# Patient Record
Sex: Female | Born: 1950 | Race: White | Hispanic: No | Marital: Married | Smoking: Never smoker
Health system: Southern US, Community
[De-identification: ages and names within clinical notes are randomized; demographics above are authoritative.]

## PROBLEM LIST (undated history)

## (undated) ENCOUNTER — Emergency Department (HOSPITAL_COMMUNITY): Disposition: A | Discharge status: Home / Self Care | Source: Home / Self Care

## (undated) DIAGNOSIS — Z780 Asymptomatic menopausal state: Secondary | ICD-10-CM

## (undated) DIAGNOSIS — K219 Gastro-esophageal reflux disease without esophagitis: Secondary | ICD-10-CM

## (undated) DIAGNOSIS — D259 Leiomyoma of uterus, unspecified: Secondary | ICD-10-CM

## (undated) DIAGNOSIS — B379 Candidiasis, unspecified: Secondary | ICD-10-CM

## (undated) DIAGNOSIS — Z8619 Personal history of other infectious and parasitic diseases: Secondary | ICD-10-CM

## (undated) DIAGNOSIS — F329 Major depressive disorder, single episode, unspecified: Secondary | ICD-10-CM

## (undated) DIAGNOSIS — F32A Depression, unspecified: Secondary | ICD-10-CM

## (undated) DIAGNOSIS — IMO0002 Reserved for concepts with insufficient information to code with codable children: Secondary | ICD-10-CM

## (undated) DIAGNOSIS — N952 Postmenopausal atrophic vaginitis: Secondary | ICD-10-CM

## (undated) DIAGNOSIS — K120 Recurrent oral aphthae: Secondary | ICD-10-CM

## (undated) DIAGNOSIS — Z87898 Personal history of other specified conditions: Secondary | ICD-10-CM

## (undated) HISTORY — PX: DILATION AND CURETTAGE OF UTERUS: SHX78

## (undated) HISTORY — PX: TUBAL LIGATION: SHX77

## (undated) HISTORY — DX: Leiomyoma of uterus, unspecified: D25.9

## (undated) HISTORY — DX: Asymptomatic menopausal state: Z78.0

## (undated) HISTORY — DX: Personal history of other specified conditions: Z87.898

## (undated) HISTORY — PX: OTHER SURGICAL HISTORY: SHX169

## (undated) HISTORY — DX: Depression, unspecified: F32.A

## (undated) HISTORY — DX: Gastro-esophageal reflux disease without esophagitis: K21.9

## (undated) HISTORY — DX: Major depressive disorder, single episode, unspecified: F32.9

## (undated) HISTORY — PX: CARPAL TUNNEL RELEASE: SHX101

## (undated) HISTORY — DX: Personal history of other infectious and parasitic diseases: Z86.19

## (undated) HISTORY — DX: Reserved for concepts with insufficient information to code with codable children: IMO0002

## (undated) HISTORY — DX: Candidiasis, unspecified: B37.9

## (undated) HISTORY — DX: Postmenopausal atrophic vaginitis: N95.2

## (undated) HISTORY — DX: Recurrent oral aphthae: K12.0

---

## 1997-11-10 ENCOUNTER — Other Ambulatory Visit: Admission: RE | Admit: 1997-11-10 | Discharge: 1997-11-10 | Payer: Self-pay | Admitting: Obstetrics and Gynecology

## 1998-04-18 DIAGNOSIS — IMO0002 Reserved for concepts with insufficient information to code with codable children: Secondary | ICD-10-CM

## 1998-04-18 DIAGNOSIS — R87619 Unspecified abnormal cytological findings in specimens from cervix uteri: Secondary | ICD-10-CM

## 1998-04-18 HISTORY — DX: Reserved for concepts with insufficient information to code with codable children: IMO0002

## 1998-04-18 HISTORY — DX: Unspecified abnormal cytological findings in specimens from cervix uteri: R87.619

## 1999-01-26 ENCOUNTER — Other Ambulatory Visit: Admission: RE | Admit: 1999-01-26 | Discharge: 1999-01-26 | Payer: Self-pay | Admitting: Obstetrics and Gynecology

## 1999-01-26 ENCOUNTER — Encounter (INDEPENDENT_AMBULATORY_CARE_PROVIDER_SITE_OTHER): Payer: Self-pay | Admitting: Specialist

## 1999-04-01 ENCOUNTER — Other Ambulatory Visit: Admission: RE | Admit: 1999-04-01 | Discharge: 1999-04-01 | Payer: Self-pay | Admitting: Obstetrics and Gynecology

## 1999-04-01 ENCOUNTER — Encounter (INDEPENDENT_AMBULATORY_CARE_PROVIDER_SITE_OTHER): Payer: Self-pay

## 1999-10-14 ENCOUNTER — Other Ambulatory Visit: Admission: RE | Admit: 1999-10-14 | Discharge: 1999-10-14 | Payer: Self-pay | Admitting: Obstetrics and Gynecology

## 1999-10-14 ENCOUNTER — Encounter (INDEPENDENT_AMBULATORY_CARE_PROVIDER_SITE_OTHER): Payer: Self-pay

## 2001-06-27 ENCOUNTER — Encounter: Payer: Self-pay | Admitting: Obstetrics and Gynecology

## 2001-06-27 ENCOUNTER — Ambulatory Visit (HOSPITAL_COMMUNITY): Admission: RE | Admit: 2001-06-27 | Discharge: 2001-06-27 | Payer: Self-pay | Admitting: Obstetrics and Gynecology

## 2001-07-05 ENCOUNTER — Encounter: Admission: RE | Admit: 2001-07-05 | Discharge: 2001-07-05 | Payer: Self-pay | Admitting: Obstetrics and Gynecology

## 2001-07-05 ENCOUNTER — Encounter: Payer: Self-pay | Admitting: Obstetrics and Gynecology

## 2002-01-02 ENCOUNTER — Encounter: Payer: Self-pay | Admitting: Obstetrics and Gynecology

## 2002-01-02 ENCOUNTER — Encounter: Admission: RE | Admit: 2002-01-02 | Discharge: 2002-01-02 | Payer: Self-pay | Admitting: Obstetrics and Gynecology

## 2002-01-16 ENCOUNTER — Ambulatory Visit (HOSPITAL_COMMUNITY): Admission: RE | Admit: 2002-01-16 | Discharge: 2002-01-16 | Payer: Self-pay | Admitting: Obstetrics and Gynecology

## 2002-01-16 ENCOUNTER — Encounter: Payer: Self-pay | Admitting: Obstetrics and Gynecology

## 2002-08-27 ENCOUNTER — Encounter: Admission: RE | Admit: 2002-08-27 | Discharge: 2002-08-27 | Payer: Self-pay | Admitting: Otolaryngology

## 2002-08-27 ENCOUNTER — Encounter: Payer: Self-pay | Admitting: Otolaryngology

## 2002-09-30 ENCOUNTER — Encounter: Payer: Self-pay | Admitting: Obstetrics and Gynecology

## 2002-09-30 ENCOUNTER — Encounter: Admission: RE | Admit: 2002-09-30 | Discharge: 2002-09-30 | Payer: Self-pay | Admitting: Obstetrics and Gynecology

## 2002-11-20 ENCOUNTER — Ambulatory Visit (HOSPITAL_COMMUNITY): Admission: RE | Admit: 2002-11-20 | Discharge: 2002-11-20 | Payer: Self-pay | Admitting: Gastroenterology

## 2003-07-15 ENCOUNTER — Other Ambulatory Visit: Admission: RE | Admit: 2003-07-15 | Discharge: 2003-07-15 | Payer: Self-pay | Admitting: Obstetrics and Gynecology

## 2004-07-19 ENCOUNTER — Other Ambulatory Visit: Admission: RE | Admit: 2004-07-19 | Discharge: 2004-07-19 | Payer: Self-pay | Admitting: Obstetrics and Gynecology

## 2004-12-29 ENCOUNTER — Encounter: Admission: RE | Admit: 2004-12-29 | Discharge: 2004-12-29 | Payer: Self-pay | Admitting: Obstetrics and Gynecology

## 2005-07-20 ENCOUNTER — Other Ambulatory Visit: Admission: RE | Admit: 2005-07-20 | Discharge: 2005-07-20 | Payer: Self-pay | Admitting: Obstetrics and Gynecology

## 2006-01-16 ENCOUNTER — Encounter: Admission: RE | Admit: 2006-01-16 | Discharge: 2006-01-16 | Payer: Self-pay | Admitting: Obstetrics and Gynecology

## 2006-07-28 ENCOUNTER — Other Ambulatory Visit: Admission: RE | Admit: 2006-07-28 | Discharge: 2006-07-28 | Payer: Self-pay | Admitting: Obstetrics and Gynecology

## 2007-01-31 ENCOUNTER — Encounter: Admission: RE | Admit: 2007-01-31 | Discharge: 2007-01-31 | Payer: Self-pay | Admitting: Obstetrics and Gynecology

## 2007-07-30 ENCOUNTER — Other Ambulatory Visit: Admission: RE | Admit: 2007-07-30 | Discharge: 2007-07-30 | Payer: Self-pay | Admitting: Obstetrics and Gynecology

## 2008-02-01 ENCOUNTER — Encounter: Admission: RE | Admit: 2008-02-01 | Discharge: 2008-02-01 | Payer: Self-pay | Admitting: Obstetrics and Gynecology

## 2008-02-13 ENCOUNTER — Encounter: Admission: RE | Admit: 2008-02-13 | Discharge: 2008-02-13 | Payer: Self-pay | Admitting: Obstetrics and Gynecology

## 2008-04-14 ENCOUNTER — Ambulatory Visit (HOSPITAL_COMMUNITY): Admission: RE | Admit: 2008-04-14 | Discharge: 2008-04-14 | Payer: Self-pay | Admitting: Gastroenterology

## 2008-04-24 ENCOUNTER — Ambulatory Visit (HOSPITAL_COMMUNITY): Admission: RE | Admit: 2008-04-24 | Discharge: 2008-04-24 | Payer: Self-pay | Admitting: Otolaryngology

## 2008-07-30 ENCOUNTER — Other Ambulatory Visit: Admission: RE | Admit: 2008-07-30 | Discharge: 2008-07-30 | Payer: Self-pay | Admitting: Obstetrics and Gynecology

## 2009-02-03 ENCOUNTER — Encounter: Admission: RE | Admit: 2009-02-03 | Discharge: 2009-02-03 | Payer: Self-pay | Admitting: Obstetrics and Gynecology

## 2009-08-04 ENCOUNTER — Other Ambulatory Visit: Admission: RE | Admit: 2009-08-04 | Discharge: 2009-08-04 | Payer: Self-pay | Admitting: Obstetrics and Gynecology

## 2010-02-04 ENCOUNTER — Encounter: Admission: RE | Admit: 2010-02-04 | Discharge: 2010-02-04 | Payer: Self-pay | Admitting: Family Medicine

## 2010-09-03 NOTE — Op Note (Signed)
NAME:  Donna Barber, Donna Barber                         ACCOUNT NO.:  [id number]   MEDICAL RECORD NO.:  [id number]                   PATIENT TYPE:  AMB   LOCATION:  ENDO                                 FACILITY:  [facility]   PHYSICIAN:  Anselmo Rod, M.D.               DATE OF BIRTH:  11-20-1950   DATE OF PROCEDURE:  11/20/2002  DATE OF DISCHARGE:                                 OPERATIVE REPORT   PROCEDURE:  Colonoscopy.   ENDOSCOPIST:  Anselmo Rod, M.D.   INSTRUMENT USED:  Olympus video colonoscope.   INDICATIONS FOR PROCEDURE:  Guaiac positive stools in a 60 year old white  female.  Rule out colonic polyps, masses, etc.   PRE-PROCEDURE PREPARATION:  Informed consent was procured from the patient.  The patient was fasted for eight hours prior to the procedure and prepped  with a bottle of magnesium citrate and a gallon of GoLYTELY the night prior  to the procedure.   PRE-PROCEDURE PHYSICAL:  VITAL SIGNS:  The patient had stable vital signs.  NECK:  Supple.  CHEST:  Clear to auscultation.  CARDIOVASCULAR:  S1 and S2 are regular.  ABDOMEN:  Soft with normal bowel sounds.   DESCRIPTION OF PROCEDURE:  The patient was placed in the left lateral  decubitus position and sedated with 100 mg of Demerol and 10 mg of Versed  intravenously.  Once the patient was adequately sedated and maintained on  low flow oxygen and continuous cardiac monitoring the Olympus video  colonoscope was advanced from the rectum to the cecum and terminal ileum.  The appendiceal orifice and ileocecal valve were clearly visualized and  photographed.  The terminal ileum appeared normal and without lesions.  No  masses, polyps, erosions, ulcerations or diverticula were seen.  Small  internal hemorrhoids were appreciated on retroflexion of the rectum.  The  patient tolerated the procedure well without complications.   IMPRESSION:  1. Essentially normal colonoscopy except for small non-bleeding internal  hemorrhoids.  No masses or polyps were seen.  2. No evidence of diverticulosis.  3. Normal appearing terminal ileum.              RECOMMENDATIONS:  1. High fiber diet with liberal fluid intake has been advocated.  2. Repeat colorectal cancer screening is recommended in the next ten years     unless the patient develops any abnormal symptoms.  3. Outpatient follow-up as the need arises in the future.                                               Anselmo Rod, M.D.    JNM/MEDQ  D:  11/21/2002  T:  11/21/2002  Job:  [id number]   cc:   Gretta Arab. Valentina Lucks, M.D.  [address]  [place]  [place] [address]  Fax: [phone]

## 2010-12-27 ENCOUNTER — Other Ambulatory Visit: Payer: Self-pay | Admitting: Family Medicine

## 2010-12-27 DIAGNOSIS — Z1231 Encounter for screening mammogram for malignant neoplasm of breast: Secondary | ICD-10-CM

## 2011-02-08 ENCOUNTER — Ambulatory Visit
Admission: RE | Admit: 2011-02-08 | Discharge: 2011-02-08 | Disposition: A | Discharge status: Home / Self Care | Payer: Self-pay | Source: Ambulatory Visit | Attending: Family Medicine | Admitting: Family Medicine

## 2011-02-08 DIAGNOSIS — Z1231 Encounter for screening mammogram for malignant neoplasm of breast: Secondary | ICD-10-CM

## 2011-07-08 DIAGNOSIS — IMO0002 Reserved for concepts with insufficient information to code with codable children: Secondary | ICD-10-CM | POA: Insufficient documentation

## 2011-07-25 ENCOUNTER — Other Ambulatory Visit: Payer: Self-pay

## 2011-07-25 DIAGNOSIS — Z780 Asymptomatic menopausal state: Secondary | ICD-10-CM

## 2011-08-04 ENCOUNTER — Other Ambulatory Visit: Payer: BC Managed Care – PPO

## 2011-08-04 ENCOUNTER — Encounter: Payer: Self-pay | Admitting: Obstetrics and Gynecology

## 2011-08-04 ENCOUNTER — Other Ambulatory Visit: Payer: Self-pay

## 2011-08-30 ENCOUNTER — Encounter: Payer: BC Managed Care – PPO | Admitting: Obstetrics and Gynecology

## 2011-08-30 ENCOUNTER — Other Ambulatory Visit: Payer: BC Managed Care – PPO

## 2011-09-15 ENCOUNTER — Other Ambulatory Visit (INDEPENDENT_AMBULATORY_CARE_PROVIDER_SITE_OTHER): Payer: BC Managed Care – PPO

## 2011-09-15 ENCOUNTER — Ambulatory Visit (INDEPENDENT_AMBULATORY_CARE_PROVIDER_SITE_OTHER): Payer: BC Managed Care – PPO | Admitting: Obstetrics and Gynecology

## 2011-09-15 ENCOUNTER — Encounter: Payer: Self-pay | Admitting: Obstetrics and Gynecology

## 2011-09-15 VITALS — BP 130/66 | Ht 65.5 in | Wt 122.0 lb

## 2011-09-15 DIAGNOSIS — N951 Menopausal and female climacteric states: Secondary | ICD-10-CM

## 2011-09-15 DIAGNOSIS — Z780 Asymptomatic menopausal state: Secondary | ICD-10-CM

## 2011-09-15 DIAGNOSIS — N952 Postmenopausal atrophic vaginitis: Secondary | ICD-10-CM

## 2011-09-15 DIAGNOSIS — M899 Disorder of bone, unspecified: Secondary | ICD-10-CM

## 2011-09-15 DIAGNOSIS — M8580 Other specified disorders of bone density and structure, unspecified site: Secondary | ICD-10-CM

## 2011-09-16 NOTE — Progress Notes (Signed)
Subjective:     Donna Barber is a 61 y.o. female who presents for evaluation of an treatment of atrophic vaginits, and to review DXA results.  Symptoms  Of atrophy are improved though not completely resolved since starting vagifem:  DXA shows slight decrease in BMD in the spine with nl BMD in the hip and a very low risk of fracture  The following portions of the patient's history were reviewed and updated as appropriate: allergies, current medications, past family history, past medical history, past surgical history and problem list.   Review of Systems Pertinent items are noted in HPI.    Objective:    Pelvic exam: Deferred  DXA:  See report   Assessment:    Atrophic vaginitis improved.  Osteopenia with low FRAX   Plan:    Continue vagifem 2x /wk  Continue wt bearing exercise, calcium Next DXA age 50 F/U @ [facility]

## 2011-09-22 ENCOUNTER — Other Ambulatory Visit: Payer: BC Managed Care – PPO

## 2011-09-22 ENCOUNTER — Encounter: Payer: BC Managed Care – PPO | Admitting: Obstetrics and Gynecology

## 2011-12-23 ENCOUNTER — Other Ambulatory Visit: Payer: Self-pay | Admitting: Dermatology

## 2012-01-03 ENCOUNTER — Other Ambulatory Visit: Payer: Self-pay | Admitting: Family Medicine

## 2012-01-03 DIAGNOSIS — Z1231 Encounter for screening mammogram for malignant neoplasm of breast: Secondary | ICD-10-CM

## 2012-02-10 ENCOUNTER — Ambulatory Visit: Payer: BC Managed Care – PPO

## 2012-03-22 ENCOUNTER — Ambulatory Visit
Admission: RE | Admit: 2012-03-22 | Discharge: 2012-03-22 | Disposition: A | Discharge status: Home / Self Care | Payer: BC Managed Care – PPO | Source: Ambulatory Visit | Attending: Family Medicine | Admitting: Family Medicine

## 2012-03-22 DIAGNOSIS — Z1231 Encounter for screening mammogram for malignant neoplasm of breast: Secondary | ICD-10-CM

## 2012-05-02 ENCOUNTER — Ambulatory Visit: Payer: BC Managed Care – PPO | Admitting: Obstetrics and Gynecology

## 2013-02-06 ENCOUNTER — Other Ambulatory Visit: Payer: Self-pay | Admitting: Dermatology

## 2013-03-05 ENCOUNTER — Other Ambulatory Visit: Payer: Self-pay | Admitting: Family Medicine

## 2013-03-05 DIAGNOSIS — Z1231 Encounter for screening mammogram for malignant neoplasm of breast: Secondary | ICD-10-CM

## 2013-03-26 ENCOUNTER — Ambulatory Visit (INDEPENDENT_AMBULATORY_CARE_PROVIDER_SITE_OTHER): Payer: BC Managed Care – PPO

## 2013-03-26 DIAGNOSIS — Z1231 Encounter for screening mammogram for malignant neoplasm of breast: Secondary | ICD-10-CM

## 2013-12-11 ENCOUNTER — Ambulatory Visit
Admission: RE | Admit: 2013-12-11 | Discharge: 2013-12-11 | Disposition: A | Discharge status: Home / Self Care | Payer: BC Managed Care – PPO | Source: Ambulatory Visit | Attending: Family Medicine | Admitting: Family Medicine

## 2013-12-11 ENCOUNTER — Other Ambulatory Visit: Payer: Self-pay | Admitting: Family Medicine

## 2013-12-11 DIAGNOSIS — M25552 Pain in left hip: Secondary | ICD-10-CM

## 2014-02-17 ENCOUNTER — Encounter: Payer: Self-pay | Admitting: Obstetrics and Gynecology

## 2014-07-28 ENCOUNTER — Other Ambulatory Visit: Payer: Self-pay

## 2014-07-28 DIAGNOSIS — Z1231 Encounter for screening mammogram for malignant neoplasm of breast: Secondary | ICD-10-CM

## 2014-08-04 ENCOUNTER — Ambulatory Visit
Admission: RE | Admit: 2014-08-04 | Discharge: 2014-08-04 | Disposition: A | Discharge status: Home / Self Care | Payer: Medicare Other | Source: Ambulatory Visit

## 2014-08-04 DIAGNOSIS — Z1231 Encounter for screening mammogram for malignant neoplasm of breast: Secondary | ICD-10-CM

## 2015-07-23 ENCOUNTER — Encounter: Payer: Self-pay | Admitting: *Deleted

## 2015-07-23 ENCOUNTER — Other Ambulatory Visit: Payer: Self-pay | Admitting: Family Medicine

## 2015-07-23 ENCOUNTER — Emergency Department
Admission: EM | Admit: 2015-07-23 | Discharge: 2015-07-23 | Disposition: A | Discharge status: Home / Self Care | Payer: Medicare Other | Source: Home / Self Care | Attending: Family Medicine | Admitting: Family Medicine

## 2015-07-23 DIAGNOSIS — W57XXXA Bitten or stung by nonvenomous insect and other nonvenomous arthropods, initial encounter: Secondary | ICD-10-CM | POA: Diagnosis not present

## 2015-07-23 DIAGNOSIS — S30861A Insect bite (nonvenomous) of abdominal wall, initial encounter: Secondary | ICD-10-CM | POA: Diagnosis not present

## 2015-07-23 MED ORDER — DOXYCYCLINE HYCLATE 100 MG PO CAPS: ORAL_CAPSULE | ORAL | Status: DC

## 2015-07-23 NOTE — Discharge Instructions (Signed)
Keep tick bite site bandaged until healed.  Apply Bacitracin ointment daily.   Tick Bite Information Ticks are insects that attach themselves to the skin and draw blood for food. There are various types of ticks. Common types include wood ticks and deer ticks. Most ticks live in shrubs and grassy areas. Ticks can climb onto your body when you make contact with leaves or grass where the tick is waiting. The most common places on the body for ticks to attach themselves are the scalp, neck, armpits, waist, and groin. Most tick bites are harmless, but sometimes ticks carry germs that cause diseases. These germs can be spread to a person during the tick's feeding process. The chance of a disease spreading through a tick bite depends on:   The type of tick.  Time of year.   How long the tick is attached.   Geographic location.  HOW CAN YOU PREVENT TICK BITES? Take these steps to help prevent tick bites when you are outdoors:  Wear protective clothing. Long sleeves and long pants are best.   Wear white clothes so you can see ticks more easily.  Tuck your pant legs into your socks.   If walking on a trail, stay in the middle of the trail to avoid brushing against bushes.  Avoid walking through areas with long grass.  Put insect repellent on all exposed skin and along boot tops, pant legs, and sleeve cuffs.   Check clothing, hair, and skin repeatedly and before going inside.   Brush off any ticks that are not attached.  Take a shower or bath as soon as possible after being outdoors.  WHAT IS THE PROPER WAY TO REMOVE A TICK? Ticks should be removed as soon as possible to help prevent diseases caused by tick bites. 1. If latex gloves are available, put them on before trying to remove a tick.  2. Using fine-point tweezers, grasp the tick as close to the skin as possible. You may also use curved forceps or a tick removal tool. Grasp the tick as close to its head as possible. Avoid  grasping the tick on its body. 3. Pull gently with steady upward pressure until the tick lets go. Do not twist the tick or jerk it suddenly. This may break off the tick's head or mouth parts. 4. Do not squeeze or crush the tick's body. This could force disease-carrying fluids from the tick into your body.  5. After the tick is removed, wash the bite area and your hands with soap and water or other disinfectant such as alcohol. 6. Apply a small amount of antiseptic cream or ointment to the bite site.  7. Wash and disinfect any instruments that were used.  Do not try to remove a tick by applying a hot match, petroleum jelly, or fingernail polish to the tick. These methods do not work and may increase the chances of disease being spread from the tick bite.  WHEN SHOULD YOU SEEK MEDICAL CARE? Contact your health care provider if you are unable to remove a tick from your skin or if a part of the tick breaks off and is stuck in the skin.  After a tick bite, you need to be aware of signs and symptoms that could be related to diseases spread by ticks. Contact your health care provider if you develop any of the following in the days or weeks after the tick bite:  Unexplained fever.  Rash. A circular rash that appears days or weeks after  the tick bite may indicate the possibility of Lyme disease. The rash may resemble a target with a bull's-eye and may occur at a different part of your body than the tick bite.  Redness and swelling in the area of the tick bite.   Tender, swollen lymph glands.   Diarrhea.   Weight loss.   Cough.   Fatigue.   Muscle, joint, or bone pain.   Abdominal pain.   Headache.   Lethargy or a change in your level of consciousness.  Difficulty walking or moving your legs.   Numbness in the legs.   Paralysis.  Shortness of breath.   Confusion.   Repeated vomiting.    This information is not intended to replace advice given to you by your  health care provider. Make sure you discuss any questions you have with your health care provider.   Document Released: 04/01/2000 Document Revised: 04/25/2014 Document Reviewed: 09/12/2012 Elsevier Interactive Patient Education 2016 [employer]  DEET is a commonly used insect repellent. DEET is effective against mosquitoes, ticks, and chiggers.DEET is not effective against stinging insects, such as bees and wasps. When mosquitoes or ticks are active, take the following precautions.  Use DEET according to the directions on the label.  Wear protective clothing if you are outside in an area where there are weeds, tall grass, or bushes. This includes long pants, socks, and loose-fitting, long-sleeved shirts. Consider spraying DEET on your clothing. Avoid being outdoors in the early evening. This is when mosquitoes are most active.  Products with a low concentration of DEET (10% to 20%) may be useful in areas with few insects. Higher concentrations of DEET may be needed in areas with many insects. Repellents used on children should not contain more than 30% DEET. Although higher concentrations of DEET (up to 95%) are available for adults, they are not recommended for routine use. Concentrations higher than 50% do not provide additional protection. Depending on the concentration of DEET in a product, it can be effective for about 2 to 6 hours.  When applying DEET to children, use the lowest concentration that is effective. Ten percent DEET will last approximately 2 to 3 hours, while 30% will last 4 to 5 hours. Do not use DEET on infants younger than 2 months old. Do not apply DEET more often than once a day to children under the age of 2.  Avoid prolonged or excessive use of DEET. Use it sparingly to cover exposed skin and clothing. Adverse reactions to DEET in the recommended concentrations are uncommon. However, skin irritation can occur in some people.  Wash all treated  skin and clothing with soap and water after returning indoors.  Do not allow children to apply insect repellent themselves.  Do not apply DEET near cuts or open wounds. You can apply DEET and sunscreen together. However, it is recommend that you apply the sunscreen first.  Do not apply DEET to a child's hands or near a child's eyes and mouth. If DEET is accidentally sprayed in the eyes, wash the eyes out with large amounts of water.  Store DEET out of the reach of children.  Most authorities feel that it is safe to use DEET during pregnancy. However, pregnant women should only use insect repellents when they are in areas with a high risk of disease carried by insects (malaria, West Nile virus, encephalitis).   This information is not intended to replace advice given to you by your health care provider.  Make sure you discuss any questions you have with your health care provider.   Document Released: 12/28/2000 Document Revised: 04/25/2014 Document Reviewed: 11/06/2014 Elsevier Interactive Patient Education [employer].

## 2015-07-23 NOTE — ED Notes (Signed)
Pt c/o tick to abdomen that she found this AM. She was able to remove most of it. She is otherwise asymptomatic.

## 2015-07-23 NOTE — ED Provider Notes (Signed)
CSN: [id number]     Arrival date & time 07/23/15  1107 History   First MD Initiated Contact with Patient 07/23/15 1140     Chief Complaint  Patient presents with  . Insect Bite      HPI Comments: Patient has been working outdoors and today found a small embedded tick on her left abdomen.  The tick was approximately 1/8inch diameter, slightly engorged, and she does not believe that it had been in place more than 36 hours.  She was able to extract the tick.  She denies rash.  No fevers, chills, and sweats.  She feels well.  The history is provided by the patient.    Past Medical History  Diagnosis Date  . Dyspareunia     secondary to  atrophy  . Yeast infection   . History of chicken pox   . History of measles, mumps, or rubella   . Postmenopausal   . Vaginal atrophy   . Abnormal Pap smear 2000    Cin-3 treated with LEEP  . Uterine fibroid   . GERD (gastroesophageal reflux disease)   . Depression   . Canker sores oral    Past Surgical History  Procedure Laterality Date  . Dilation and curettage of uterus    . Tubal ligation      bilateral  . Shoulder sugery    . Carpal tunnel release     Family History  Problem Relation Age of Onset  . Hypertension Mother   . Diabetes Mother   . Heart disease Mother   . Cancer Brother    Social History  Substance Use Topics  . Smoking status: Never Smoker   . Smokeless tobacco: Never Used  . Alcohol Use: No   OB History    Gravida Para Term Preterm AB TAB SAB Ectopic Multiple Living   2 2        2      Review of Systems  Constitutional: Negative for fever, chills, diaphoresis, activity change, appetite change and fatigue.  HENT: Negative.   Eyes: Negative.   Respiratory: Negative.   Cardiovascular: Negative.   Gastrointestinal: Negative.   Genitourinary: Negative.   Musculoskeletal: Negative.   Skin: Positive for wound. Negative for rash.  Neurological: Negative for headaches.  Hematological: Negative.     Allergies    Codeine and Sulfur  Home Medications   Prior to Admission medications   Medication Sig Start Date End Date Taking? Authorizing Provider  buPROPion (WELLBUTRIN XL) 300 MG 24 hr tablet Take 300 mg by mouth daily.   Yes Historical Provider, MD  doxycycline (VIBRAMYCIN) 100 MG capsule Take two caps as a single dose.  Take with food 07/23/15   Kandra Nicolas, MD   Meds Ordered and Administered this Visit  Medications - No data to display  BP 150/73 mmHg  Pulse 63  Temp(Src) 97.8 F (36.6 C) (Oral)  Ht 5' 5.5" (1.664 m)  Wt 120 lb (54.432 kg)  BMI 19.66 kg/m2  SpO2 100% No data found.   Physical Exam  Constitutional: She is oriented to person, place, and time. She appears well-developed and well-nourished.  HENT:  Head: Normocephalic.  Eyes: Pupils are equal, round, and reactive to light.  Abdominal:    On the patient's left abdomen, as noted on diagram, is a 32mm diameter shallow excoriation surrounded by about 1cm diameter faint erythema.  No swelling, drainage, or tenderness to palpation.  No visible retained tick mouth parts.    Musculoskeletal: She exhibits  no edema.  Neurological: She is alert and oriented to person, place, and time.  Skin: Skin is warm and dry. No rash noted.  Nursing note and vitals reviewed.   ED Course  Procedures none    Labs Reviewed  ROCKY MTN SPOTTED FVR ABS PNL(IGG+IGM)  B. BURGDORFI ANTIBODIES      MDM   1. Tick bite of abdomen, initial encounter    Bacitracin and bandage applied. Empiric doxycycline 200mg  PO, single dose. RMSF and Lyme disease titers pending. Keep tick bite site bandaged until healed.  Apply Bacitracin ointment daily. Followup with Family Doctor if symptoms worsen.    Kandra Nicolas, MD 07/23/15 1800

## 2015-07-24 LAB — LYME AB/WESTERN BLOT REFLEX: B burgdorferi Ab IgG+IgM: <0.9 Index (ref <0.90)

## 2015-07-27 LAB — ROCKY MTN SPOTTED FVR ABS PNL(IGG+IGM)
RMSF IGG: NOT DETECTED
RMSF IGM: NOT DETECTED

## 2015-07-28 ENCOUNTER — Telehealth: Payer: Self-pay | Admitting: *Deleted

## 2015-09-05 ENCOUNTER — Emergency Department
Admission: EM | Admit: 2015-09-05 | Discharge: 2015-09-05 | Disposition: A | Discharge status: Home / Self Care | Payer: Medicare Other | Source: Home / Self Care | Attending: Family Medicine | Admitting: Family Medicine

## 2015-09-05 ENCOUNTER — Encounter: Payer: Self-pay | Admitting: Emergency Medicine

## 2015-09-05 DIAGNOSIS — W57XXXA Bitten or stung by nonvenomous insect and other nonvenomous arthropods, initial encounter: Secondary | ICD-10-CM | POA: Diagnosis not present

## 2015-09-05 DIAGNOSIS — S30861A Insect bite (nonvenomous) of abdominal wall, initial encounter: Secondary | ICD-10-CM | POA: Diagnosis not present

## 2015-09-05 MED ORDER — DOXYCYCLINE HYCLATE 100 MG PO CAPS: 200.0000 mg | ORAL_CAPSULE | Freq: Once | ORAL | Status: DC

## 2015-09-05 MED ORDER — TRIAMCINOLONE ACETONIDE 0.1 % EX CREA: 1.0000 "application " | TOPICAL_CREAM | Freq: Two times a day (BID) | CUTANEOUS | Status: DC

## 2015-09-05 NOTE — ED Provider Notes (Signed)
CSN: [id number]     Arrival date & time 09/05/15  1026 History   First MD Initiated Contact with Patient 09/05/15 1058     Chief Complaint  Patient presents with  . Insect Bite   (Consider location/radiation/quality/duration/timing/severity/associated sxs/prior Treatment) HPI  The pt is a 65yo female presenting to [facility] with c/o 2 rashes on her Left side and Left groin after removing 2 ticks 2 days ago.  She noticed worsening redness and itching to the rash in her groin.  She was able to remove the entire bodies of the ticks.  Ticks were on her less than 24 hours. She has used OTC hydrocortisone cream w/o relief of itching. Denies fever, n/v/d. She was seen a few months ago for same and prescribed an antibiotic, doxycycline, and did well. No side effects.   Past Medical History  Diagnosis Date  . Dyspareunia     secondary to  atrophy  . Yeast infection   . History of chicken pox   . History of measles, mumps, or rubella   . Postmenopausal   . Vaginal atrophy   . Abnormal Pap smear 2000    Cin-3 treated with LEEP  . Uterine fibroid   . GERD (gastroesophageal reflux disease)   . Depression   . Canker sores oral    Past Surgical History  Procedure Laterality Date  . Dilation and curettage of uterus    . Tubal ligation      bilateral  . Shoulder sugery    . Carpal tunnel release     Family History  Problem Relation Age of Onset  . Hypertension Mother   . Diabetes Mother   . Heart disease Mother   . Cancer Brother    Social History  Substance Use Topics  . Smoking status: Never Smoker   . Smokeless tobacco: Never Used  . Alcohol Use: Yes   OB History    Gravida Para Term Preterm AB TAB SAB Ectopic Multiple Living   2 2        2      Review of Systems  Constitutional: Negative for fever and chills.  Gastrointestinal: Negative for nausea, vomiting and diarrhea.  Musculoskeletal: Negative for myalgias, joint swelling and arthralgias.  Skin: Positive for color change and  rash. Negative for wound.    Allergies  Codeine and Sulfur  Home Medications   Prior to Admission medications   Medication Sig Start Date End Date Taking? Authorizing Provider  buPROPion (WELLBUTRIN XL) 300 MG 24 hr tablet Take 300 mg by mouth daily.    Historical Provider, MD  doxycycline (VIBRAMYCIN) 100 MG capsule Take two caps as a single dose.  Take with food 07/23/15   Kandra Nicolas, MD  doxycycline (VIBRAMYCIN) 100 MG capsule Take 2 capsules (200 mg total) by mouth once. As a single dose 09/05/15   Noland Fordyce, PA-C  triamcinolone cream (KENALOG) 0.1 % Apply 1 application topically 2 (two) times daily. 09/05/15   Noland Fordyce, PA-C   Meds Ordered and Administered this Visit  Medications - No data to display  BP 121/70 mmHg  Pulse 78  Temp(Src) 98 F (36.7 C) (Oral)  Wt 121 lb (54.885 kg)  SpO2 100% No data found.   Physical Exam  Constitutional: She is oriented to person, place, and time. She appears well-developed and well-nourished.  HENT:  Head: Normocephalic and atraumatic.  Eyes: EOM are normal.  Neck: Normal range of motion.  Cardiovascular: Normal rate.   Pulmonary/Chest: Effort normal.  Musculoskeletal: Normal range of motion.  Neurological: She is alert and oriented to person, place, and time.  Skin: Skin is warm and dry. Rash noted. There is erythema.     Left lower side- 3cm erythematous macular type rash. Non-tender. No bleeding or discharge. Centralized puncture c/w insect bite. No foreign bodies seen or palpated.  Left groin: 4cm area of erythema and warmth. Non-tender. Superficial abrasion c/w insect bite.  Psychiatric: She has a normal mood and affect. Her behavior is normal.  Nursing note and vitals reviewed.   ED Course  Procedures (including critical care time)  Labs Review Labs Reviewed - No data to display  Imaging Review No results found.     MDM   1. Tick bite of abdomen, initial encounter    Pt presenting to [facility] [place] with rashes  from 2 tick bites 2 days ago. Will treat empirically for tick born illnesses.   Rx: Doxycycline 200mg  once as a single dose. Prescription provided for Triamcinolone to help with itching. Pt believes she may have some at home but unsure.   F/u with PCP in 1 week if not improving, sooner if worsening. Patient verbalized understanding and agreement with treatment plan.     Noland Fordyce, PA-C 09/05/15 1224

## 2015-09-05 NOTE — ED Notes (Signed)
Pt c/o tick bite x2 days ago. Pt was able to remove the entire tick. She c/o redness,swelling and itching at site.

## 2015-09-13 ENCOUNTER — Emergency Department
Admission: EM | Admit: 2015-09-13 | Discharge: 2015-09-13 | Disposition: A | Discharge status: Home / Self Care | Payer: Medicare Other | Source: Home / Self Care | Attending: Family Medicine | Admitting: Family Medicine

## 2015-09-13 ENCOUNTER — Encounter: Payer: Self-pay | Admitting: Emergency Medicine

## 2015-09-13 DIAGNOSIS — S61219A Laceration without foreign body of unspecified finger without damage to nail, initial encounter: Secondary | ICD-10-CM | POA: Diagnosis not present

## 2015-09-13 NOTE — Discharge Instructions (Signed)
Change dressing daily and apply Bacitracin ointment to wound.  Keep wound clean and dry.  Return for any signs of infection (or follow-up with family doctor):  Increasing redness, swelling, pain, heat, drainage, etc. Return in 10 days for suture removal.  May take Tylenol for pain.   Laceration Care, Adult A laceration is a cut that goes through all of the layers of the skin and into the tissue that is right under the skin. Some lacerations heal on their own. Others need to be closed with stitches (sutures), staples, skin adhesive strips, or skin glue. Proper laceration care minimizes the risk of infection and helps the laceration to heal better. HOW TO CARE FOR YOUR LACERATION If sutures or staples were used:  Keep the wound clean and dry.  If you were given a bandage (dressing), you should change it at least one time per day or as told by your health care provider. You should also change it if it becomes wet or dirty.  Keep the wound completely dry for the first 24 hours or as told by your health care provider. After that time, you may shower or bathe. However, make sure that the wound is not soaked in water until after the sutures or staples have been removed.  Clean the wound one time each day or as told by your health care provider:  Wash the wound with soap and water.  Rinse the wound with water to remove all soap.  Pat the wound dry with a clean towel. Do not rub the wound.  After cleaning the wound, apply a thin layer of antibiotic ointmentas told by your health care provider. This will help to prevent infection and keep the dressing from sticking to the wound.  Have the sutures or staples removed as told by your health care provider. If skin adhesive strips were used:  Keep the wound clean and dry.  If you were given a bandage (dressing), you should change it at least one time per day or as told by your health care provider. You should also change it if it becomes dirty or  wet.  Do not get the skin adhesive strips wet. You may shower or bathe, but be careful to keep the wound dry.  If the wound gets wet, pat it dry with a clean towel. Do not rub the wound.  Skin adhesive strips fall off on their own. You may trim the strips as the wound heals. Do not remove skin adhesive strips that are still stuck to the wound. They will fall off in time. If skin glue was used:  Try to keep the wound dry, but you may briefly wet it in the shower or bath. Do not soak the wound in water, such as by swimming.  After you have showered or bathed, gently pat the wound dry with a clean towel. Do not rub the wound.  Do not do any activities that will make you sweat heavily until the skin glue has fallen off on its own.  Do not apply liquid, cream, or ointment medicine to the wound while the skin glue is in place. Using those may loosen the film before the wound has healed.  If you were given a bandage (dressing), you should change it at least one time per day or as told by your health care provider. You should also change it if it becomes dirty or wet.  If a dressing is placed over the wound, be careful not to apply tape directly  over the skin glue. Doing that may cause the glue to be pulled off before the wound has healed.  Do not pick at the glue. The skin glue usually remains in place for 5-10 days, then it falls off of the skin. General Instructions  Take over-the-counter and prescription medicines only as told by your health care provider.  If you were prescribed an antibiotic medicine or ointment, take or apply it as told by your doctor. Do not stop using it even if your condition improves.  To help prevent scarring, make sure to cover your wound with sunscreen whenever you are outside after stitches are removed, after adhesive strips are removed, or when glue remains in place and the wound is healed. Make sure to wear a sunscreen of at least 30 SPF.  Do not scratch or  pick at the wound.  Keep all follow-up visits as told by your health care provider. This is important.  Check your wound every day for signs of infection. Watch for:  Redness, swelling, or pain.  Fluid, blood, or pus.  Raise (elevate) the injured area above the level of your heart while you are sitting or lying down, if possible. SEEK MEDICAL CARE IF:  You received a tetanus shot and you have swelling, severe pain, redness, or bleeding at the injection site.  You have a fever.  A wound that was closed breaks open.  You notice a bad smell coming from your wound or your dressing.  You notice something coming out of the wound, such as wood or glass.  Your pain is not controlled with medicine.  You have increased redness, swelling, or pain at the site of your wound.  You have fluid, blood, or pus coming from your wound.  You notice a change in the color of your skin near your wound.  You need to change the dressing frequently due to fluid, blood, or pus draining from the wound.  You develop a new rash.  You develop numbness around the wound. SEEK IMMEDIATE MEDICAL CARE IF:  You develop severe swelling around the wound.  Your pain suddenly increases and is severe.  You develop painful lumps near the wound or on skin that is anywhere on your body.  You have a red streak going away from your wound.  The wound is on your hand or foot and you cannot properly move a finger or toe.  The wound is on your hand or foot and you notice that your fingers or toes look pale or bluish.   This information is not intended to replace advice given to you by your health care provider. Make sure you discuss any questions you have with your health care provider.   Document Released: 04/04/2005 Document Revised: 08/19/2014 Document Reviewed: 03/31/2014 Elsevier Interactive Patient Education [employer].

## 2015-09-13 NOTE — ED Notes (Signed)
Pt cut her left 5th finger today while slicing onions with a clean knife.  Td is current x 3 years ago

## 2015-09-13 NOTE — ED Provider Notes (Signed)
CSN: [id number]     Arrival date & time 09/13/15  1557 History   First MD Initiated Contact with Patient 09/13/15 1612     Chief Complaint  Patient presents with  . Extremity Laceration      HPI Comments: Patient lacerated her left 5th finger today while slicing onions.  Her Tdap was approximately 3 years ago.  Patient is a 65 y.o. female presenting with skin laceration. The history is provided by the patient.  Laceration Location:  Finger Finger laceration location:  L little finger Length (cm):  1 Depth:  Through dermis Quality comment:  Flap Bleeding: controlled   Time since incident:  1 hour Laceration mechanism:  Knife Pain details:    Quality:  Aching   Severity:  Mild   Timing:  Constant   Progression:  Unchanged Foreign body present:  No foreign bodies Worsened by:  Movement Tetanus status:  Up to date   Past Medical History  Diagnosis Date  . Dyspareunia     secondary to  atrophy  . Yeast infection   . History of chicken pox   . History of measles, mumps, or rubella   . Postmenopausal   . Vaginal atrophy   . Abnormal Pap smear 2000    Cin-3 treated with LEEP  . Uterine fibroid   . GERD (gastroesophageal reflux disease)   . Depression   . Canker sores oral    Past Surgical History  Procedure Laterality Date  . Dilation and curettage of uterus    . Tubal ligation      bilateral  . Shoulder sugery    . Carpal tunnel release     Family History  Problem Relation Age of Onset  . Hypertension Mother   . Diabetes Mother   . Heart disease Mother   . Cancer Brother    Social History  Substance Use Topics  . Smoking status: Never Smoker   . Smokeless tobacco: Never Used  . Alcohol Use: Yes   OB History    Gravida Para Term Preterm AB TAB SAB Ectopic Multiple Living   2 2        2      Review of Systems  All other systems reviewed and are negative.   Allergies  Codeine and Sulfur  Home Medications   Prior to Admission medications     Medication Sig Start Date End Date Taking? Authorizing Provider  buPROPion (WELLBUTRIN XL) 300 MG 24 hr tablet Take 300 mg by mouth daily.    Historical Provider, MD   Meds Ordered and Administered this Visit  Medications - No data to display  BP 161/75 mmHg  Pulse 71  Temp(Src) 98.9 F (37.2 C) (Oral)  Ht 5' 5.5" (1.664 m)  Wt 120 lb (54.432 kg)  BMI 19.66 kg/m2  SpO2 100% No data found.   Physical Exam  Constitutional: She is oriented to person, place, and time. She appears well-developed and well-nourished. No distress.  HENT:  Head: Atraumatic.  Eyes: Conjunctivae are normal. Pupils are equal, round, and reactive to light.  Musculoskeletal:       Left hand: She exhibits laceration. She exhibits normal capillary refill. Normal sensation noted.       Hands: One cm long simple flap laceration distal phalanx tip of left 5th finger.  Neurological: She is alert and oriented to person, place, and time.  Skin: Skin is warm and dry.  Nursing note and vitals reviewed.   ED Course  Procedures Laceration Repair Discussed  benefits and risks of procedure and verbal consent obtained. Using sterile technique and digital anesthesia with 2% lidocaine without epinephrine, cleansed wound with Betadine followed by copious lavage with normal saline.  Wound carefully inspected for debris and foreign bodies; none found.  Wound closed with #3, 5-0 interrupted nylon sutures.  Bacitracin and non-stick sterile dressing applied.  Wound precautions explained to patient.  Return for suture removal in 10 days.   MDM   1. Laceration of finger of left hand, initial encounter     Change dressing daily and apply Bacitracin ointment to wound.  Keep wound clean and dry.  Return for any signs of infection (or follow-up with family doctor):  Increasing redness, swelling, pain, heat, drainage, etc. Return in 10 days for suture removal.  May take Tylenol for pain.    Kandra Nicolas, MD 09/20/15 1655

## 2016-06-19 IMAGING — MG MM SCREEN MAMMOGRAM BILATERAL
4 series · 4 of 4 positions shown · non-contrast
Comparison: Previous exam(s).

CLINICAL DATA: Screening.

EXAM:
DIGITAL SCREENING BILATERAL MAMMOGRAM WITH CAD

[R CC]
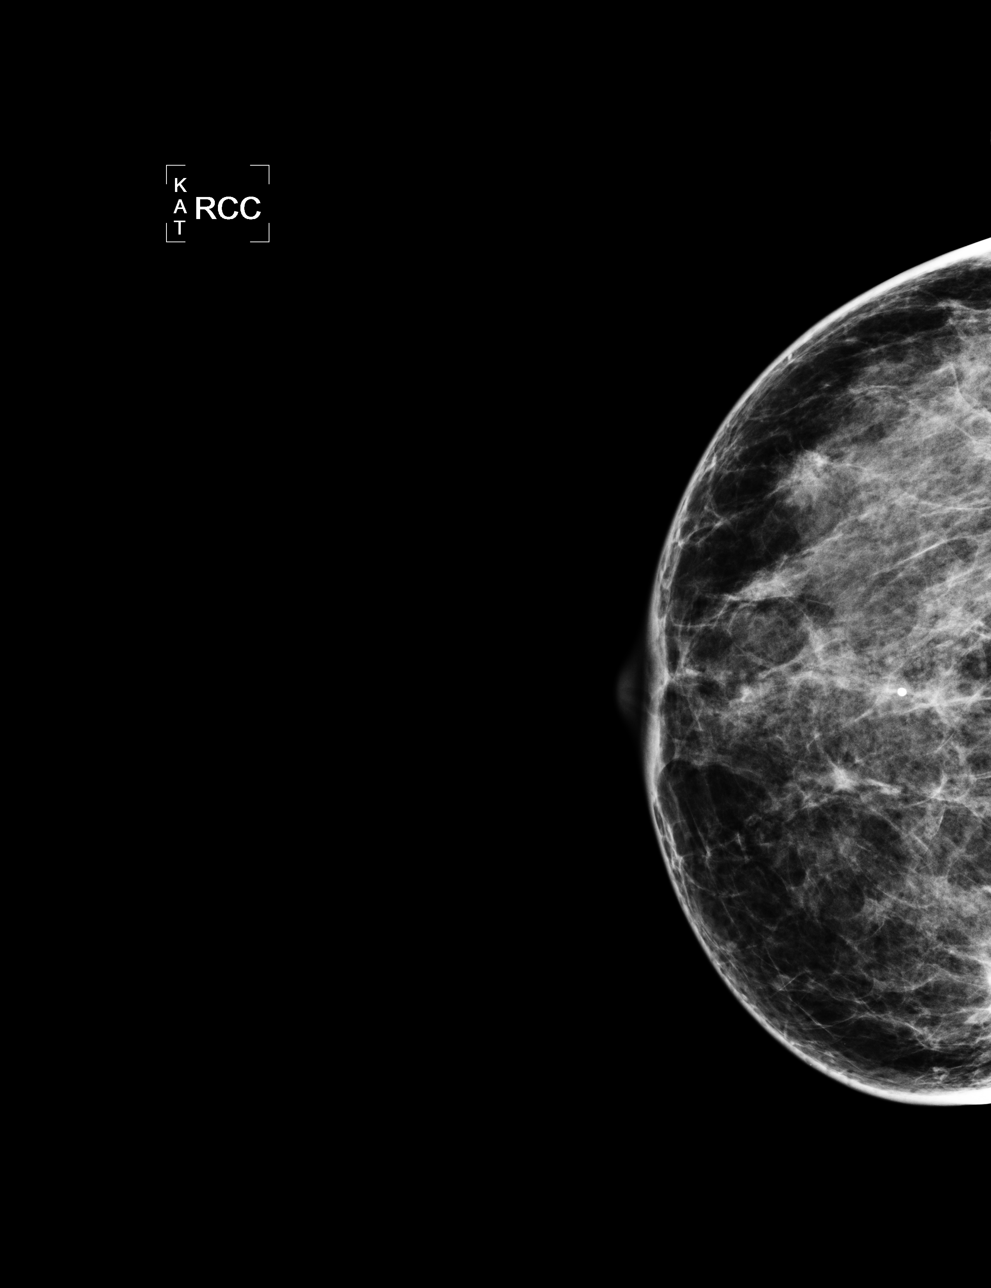

[L CC]
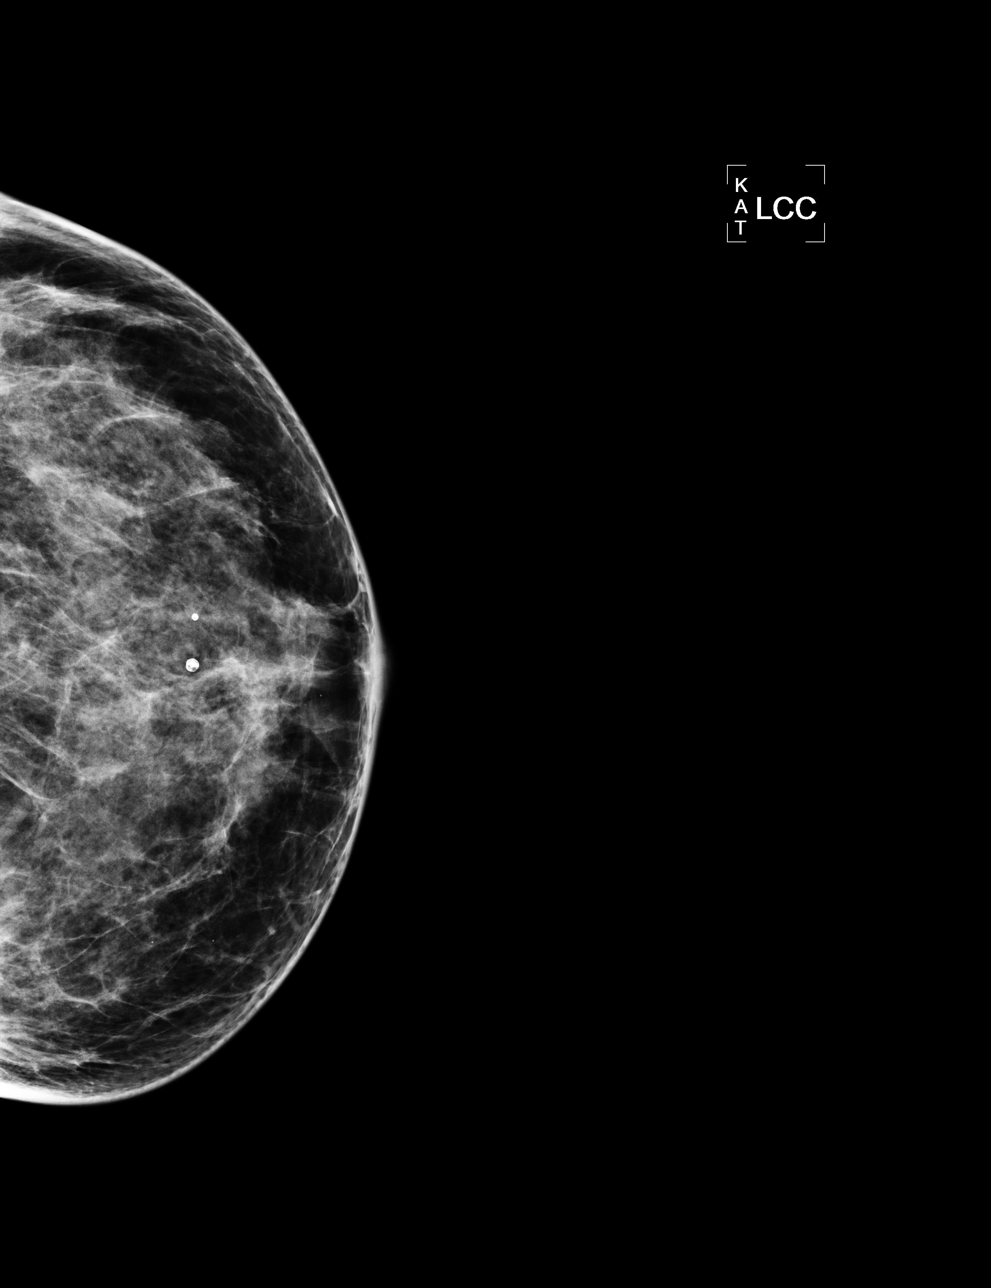

[L MLO]
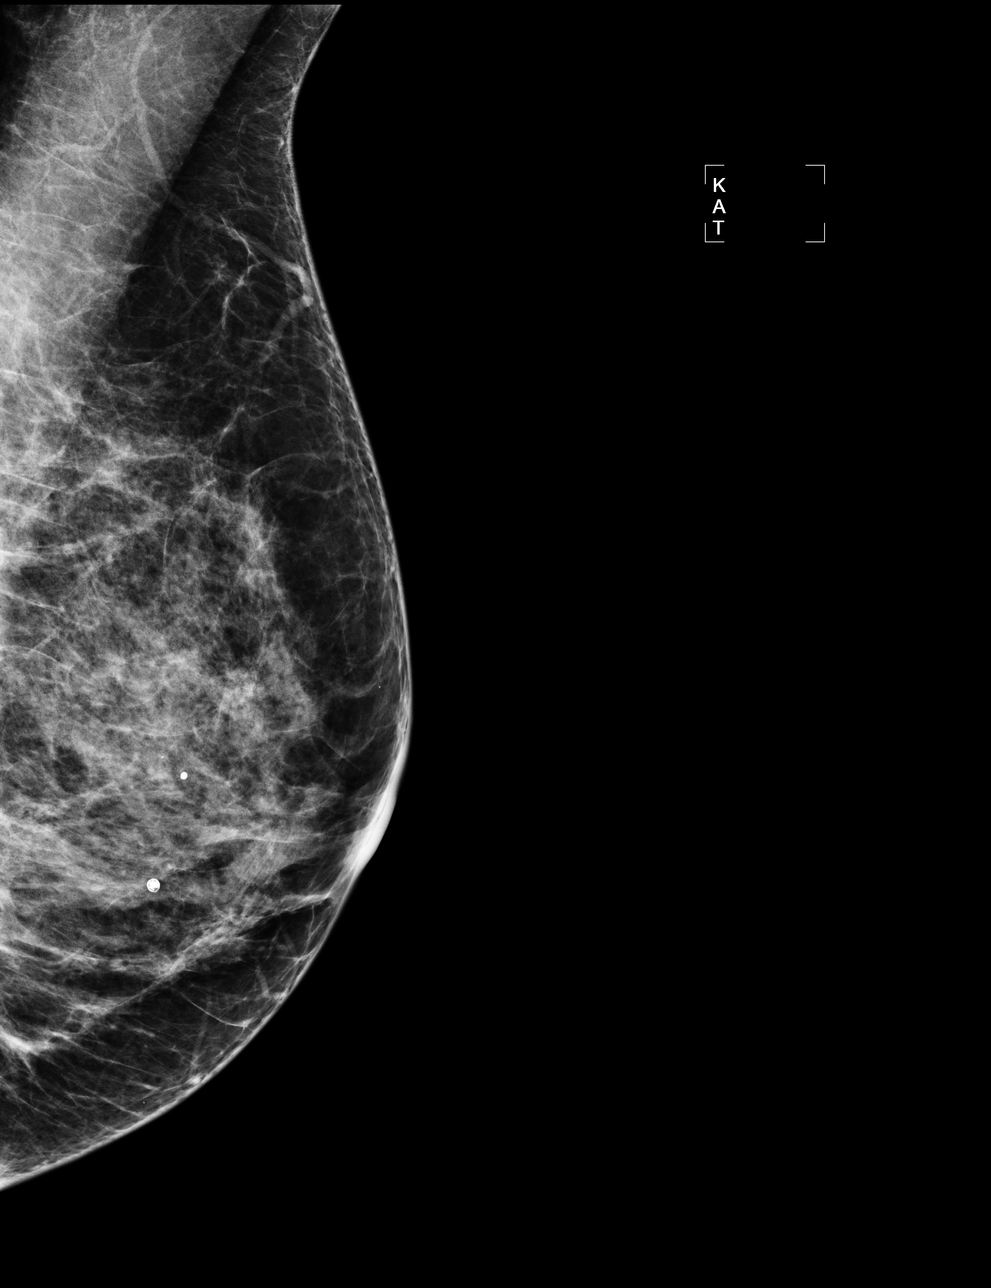

[R MLO]
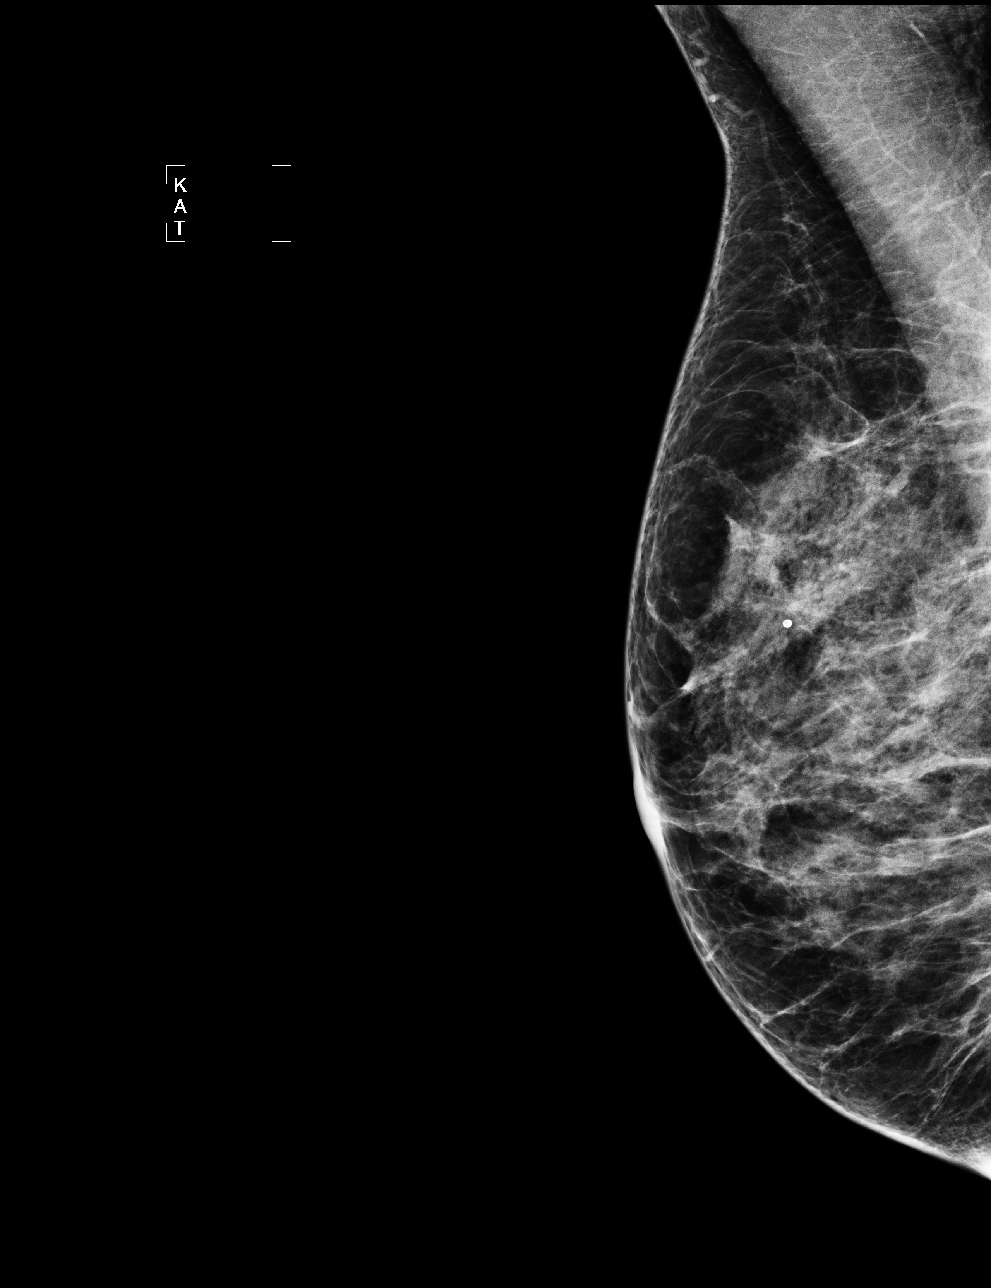

[4 of 4 positions shown; findings below may reference images not displayed]

ACR Breast Density Category c: The breast tissue is heterogeneously
dense, which may obscure small masses.
FINDINGS: There are no findings suspicious for malignancy. Images were
processed with CAD.
IMPRESSION: No mammographic evidence of malignancy. A result letter of this
screening mammogram will be mailed directly to the patient.

RECOMMENDATION:
Screening mammogram in one year. (Code:YJ-[id number])

BI-RADS CATEGORY  1: Negative.

## 2017-11-24 DIAGNOSIS — H5200 Hypermetropia, unspecified eye: Secondary | ICD-10-CM | POA: Diagnosis not present

## 2017-12-26 DIAGNOSIS — Z01419 Encounter for gynecological examination (general) (routine) without abnormal findings: Secondary | ICD-10-CM | POA: Diagnosis not present

## 2017-12-26 DIAGNOSIS — E7800 Pure hypercholesterolemia, unspecified: Secondary | ICD-10-CM | POA: Diagnosis not present

## 2017-12-26 DIAGNOSIS — Z0000 Encounter for general adult medical examination without abnormal findings: Secondary | ICD-10-CM | POA: Diagnosis not present

## 2017-12-26 DIAGNOSIS — R69 Illness, unspecified: Secondary | ICD-10-CM | POA: Diagnosis not present

## 2017-12-26 DIAGNOSIS — Z1339 Encounter for screening examination for other mental health and behavioral disorders: Secondary | ICD-10-CM | POA: Diagnosis not present

## 2017-12-26 DIAGNOSIS — Z1159 Encounter for screening for other viral diseases: Secondary | ICD-10-CM | POA: Diagnosis not present

## 2017-12-26 DIAGNOSIS — K219 Gastro-esophageal reflux disease without esophagitis: Secondary | ICD-10-CM | POA: Diagnosis not present

## 2017-12-26 DIAGNOSIS — M859 Disorder of bone density and structure, unspecified: Secondary | ICD-10-CM | POA: Diagnosis not present

## 2017-12-26 DIAGNOSIS — Z131 Encounter for screening for diabetes mellitus: Secondary | ICD-10-CM | POA: Diagnosis not present

## 2018-01-03 DIAGNOSIS — R69 Illness, unspecified: Secondary | ICD-10-CM | POA: Diagnosis not present

## 2018-02-07 DIAGNOSIS — M8588 Other specified disorders of bone density and structure, other site: Secondary | ICD-10-CM | POA: Diagnosis not present

## 2018-03-21 ENCOUNTER — Encounter: Payer: Self-pay | Admitting: Emergency Medicine

## 2018-03-21 ENCOUNTER — Emergency Department (INDEPENDENT_AMBULATORY_CARE_PROVIDER_SITE_OTHER)
Admission: EM | Admit: 2018-03-21 | Discharge: 2018-03-21 | Disposition: A | Discharge status: Home / Self Care | Payer: Medicare HMO | Source: Home / Self Care | Attending: Family Medicine | Admitting: Family Medicine

## 2018-03-21 ENCOUNTER — Other Ambulatory Visit: Payer: Self-pay

## 2018-03-21 DIAGNOSIS — W010XXA Fall on same level from slipping, tripping and stumbling without subsequent striking against object, initial encounter: Secondary | ICD-10-CM

## 2018-03-21 DIAGNOSIS — S0181XA Laceration without foreign body of other part of head, initial encounter: Secondary | ICD-10-CM

## 2018-03-21 NOTE — ED Provider Notes (Signed)
[facility]    CSN: [id number] Arrival date & time: 03/21/18  1348     History   Chief Complaint Chief Complaint  Patient presents with  . Laceration    HPI Donna Barber is a 67 y.o. female.   HPI  Donna Barber is a 67 y.o. female presenting to UC with c/o laceration to her chin that occurred just PTA. Pt tripped in her garage and hit her chin on a plastic part of her leaf blower. Denies LOC. Denies HA or dizziness. Denies other injuries including no tongue or tooth injury.  Bleeding controlled PTA with direct pressure. She is not on blood thinners. Last tetanus shot was about 5 years ago.   Past Medical History:  Diagnosis Date  . Abnormal Pap smear 2000   Cin-3 treated with LEEP  . Canker sores oral   . Depression   . Dyspareunia    secondary to  atrophy  . GERD (gastroesophageal reflux disease)   . History of chicken pox   . History of measles, mumps, or rubella   . Postmenopausal   . Uterine fibroid   . Vaginal atrophy   . Yeast infection     Patient Active Problem List   Diagnosis Date Noted  . Osteopenia 09/15/2011  . Atrophic vaginitis 09/15/2011  . H/O dyspareunia 07/08/2011    Past Surgical History:  Procedure Laterality Date  . CARPAL TUNNEL RELEASE    . DILATION AND CURETTAGE OF UTERUS    . shoulder sugery    . TUBAL LIGATION     bilateral    OB History    Gravida  2   Para  2   Term      Preterm      AB      Living  2     SAB      TAB      Ectopic      Multiple      Live Births  2            Home Medications    Prior to Admission medications   Medication Sig Start Date End Date Taking? Authorizing Provider  buPROPion (WELLBUTRIN XL) 300 MG 24 hr tablet Take 300 mg by mouth daily.    [provider]    Family History Family History  Problem Relation Age of Onset  . Hypertension Mother   . Diabetes Mother   . Heart disease Mother   . Cancer Brother     Social History Social  History   Tobacco Use  . Smoking status: Never Smoker  . Smokeless tobacco: Never Used  Substance Use Topics  . Alcohol use: Yes  . Drug use: No     Allergies   Codeine and Sulfur   Review of Systems Review of Systems  Musculoskeletal: Negative for neck pain.  Skin: Positive for wound. Negative for color change.  Neurological: Negative for dizziness, light-headedness and headaches.     Physical Exam Triage Vital Signs ED Triage Vitals  Enc Vitals Group     BP 03/21/18 1417 (!) 172/88     Pulse Rate 03/21/18 1417 66     Resp --      Temp 03/21/18 1417 98 F (36.7 C)     Temp Source 03/21/18 1417 Oral     SpO2 03/21/18 1417 100 %     Weight 03/21/18 1418 120 lb (54.4 kg)     Height 03/21/18 1418 5\' 5"  (1.651 m)  Head Circumference --      Peak Flow --      Pain Score 03/21/18 1417 2     Pain Loc --      Pain Edu? --      Excl. in [place]? --    No data found.  Updated Vital Signs BP (!) 172/88 (BP Location: Right Arm)   Pulse 66   Temp 98 F (36.7 C) (Oral)   Ht 5\' 5"  (1.651 m)   Wt 120 lb (54.4 kg)   SpO2 100%   BMI 19.97 kg/m   Visual Acuity Right Eye Distance:   Left Eye Distance:   Bilateral Distance:    Right Eye Near:   Left Eye Near:    Bilateral Near:     Physical Exam  Constitutional: She is oriented to person, place, and time. She appears well-developed and well-nourished.  HENT:  Head: Normocephalic. Head is with laceration.    Mouth/Throat: Uvula is midline, oropharynx is clear and moist and mucous membranes are normal. No trismus in the jaw.  6cm gaping laceration through dermis on chin.  No bone exposure. No crepitus. No foreign bodies seen or palpated.   Eyes: Pupils are equal, round, and reactive to light. EOM are normal.  Neck: Normal range of motion.  Cardiovascular: Normal rate.  Pulmonary/Chest: Effort normal. No respiratory distress.  Musculoskeletal: Normal range of motion.  Neurological: She is alert and oriented to  person, place, and time.  Skin: Skin is warm and dry.  Psychiatric: She has a normal mood and affect. Her behavior is normal.  Nursing note and vitals reviewed.    UC Treatments / Results  Labs (all labs ordered are listed, but only abnormal results are displayed) Labs Reviewed - No data to display  EKG None  Radiology No results found.  Procedures Laceration Repair Date/Time: 03/21/2018 4:19 PM Performed by: Noe Gens, PA-C Authorized by: Kandra Nicolas, MD   Consent:    Consent obtained:  Verbal   Consent given by:  Patient   Risks discussed:  Infection, poor cosmetic result, pain and poor wound healing Anesthesia (see MAR for exact dosages):    Anesthesia method:  Local infiltration   Local anesthetic:  Lidocaine 1% WITH epi Laceration details:    Location:  Face   Face location:  Chin   Length (cm):  6   Depth (mm):  4 Repair type:    Repair type:  Intermediate Pre-procedure details:    Preparation:  Patient was prepped and draped in usual sterile fashion Exploration:    Hemostasis achieved with:  Direct pressure   Wound exploration: wound explored through full range of motion and entire depth of wound probed and visualized     Wound extent: areolar tissue violated     Wound extent: no fascia violation noted, no foreign bodies/material noted, no muscle damage noted, no nerve damage noted, no tendon damage noted, no underlying fracture noted and no vascular damage noted     Contaminated: no   Treatment:    Area cleansed with:  Saline   Amount of cleaning:  Standard   Irrigation solution:  Sterile saline Subcutaneous repair:    Suture size:  5-0   Suture material:  Vicryl   Number of sutures:  3 Skin repair:    Repair method:  Sutures   Suture size:  4-0   Suture material:  Prolene   Suture technique:  Simple interrupted   Number of sutures:  7 Approximation:  Approximation:  Close Post-procedure details:    Dressing:  Antibiotic ointment and  adhesive bandage   Patient tolerance of procedure:  Tolerated well, no immediate complications   (including critical care time)  Medications Ordered in UC Medications - No data to display  Initial Impression / Assessment and Plan / UC Course  I have reviewed the triage vital signs and the nursing notes.  Pertinent labs & imaging results that were available during my care of the patient were reviewed by me and considered in my medical decision making (see chart for details).     Laceration to chin, sutured closed as noted above Home care info provided below.   Final Clinical Impressions(s) / UC Diagnoses   Final diagnoses:  Chin laceration, initial encounter  Fall from slip, trip, or stumble, initial encounter     Discharge Instructions      You have 3 sutures on the inside, which will dissolve on their own within about 1 week and you have 7 sutures on the outside.  These will need to be taken out in 5-7 days. You may return to this urgent care to have them removed or follow up with your family doctor. Please follow up sooner than 5 days if you have any concern for infection or poor wound healing. Please see additional information in this packet on facial lacerations to help limit scarring.     ED Prescriptions    None     Controlled Substance Prescriptions [place] Controlled Substance Registry consulted? Not Applicable   Tyrell Antonio 03/21/18 1621

## 2018-03-21 NOTE — ED Triage Notes (Signed)
Chin lacaeration, fell today in the garage unto a hard plastic leaf blower.

## 2018-03-21 NOTE — Discharge Instructions (Signed)
°  You have 3 sutures on the inside, which will dissolve on their own within about 1 week and you have 7 sutures on the outside.  These will need to be taken out in 5-7 days. You may return to this urgent care to have them removed or follow up with your family doctor. Please follow up sooner than 5 days if you have any concern for infection or poor wound healing. Please see additional information in this packet on facial lacerations to help limit scarring.

## 2018-03-27 ENCOUNTER — Emergency Department
Admission: EM | Admit: 2018-03-27 | Discharge: 2018-03-27 | Disposition: A | Discharge status: Home / Self Care | Payer: Medicare HMO | Source: Home / Self Care

## 2018-03-27 NOTE — ED Triage Notes (Signed)
Remove 6 sutures, wound is well healed, no signs of infection

## 2018-04-05 DIAGNOSIS — M25522 Pain in left elbow: Secondary | ICD-10-CM | POA: Diagnosis not present

## 2018-04-05 DIAGNOSIS — M25562 Pain in left knee: Secondary | ICD-10-CM | POA: Diagnosis not present

## 2018-04-05 DIAGNOSIS — W19XXXA Unspecified fall, initial encounter: Secondary | ICD-10-CM | POA: Diagnosis not present

## 2018-04-05 DIAGNOSIS — Z23 Encounter for immunization: Secondary | ICD-10-CM | POA: Diagnosis not present

## 2018-08-13 DIAGNOSIS — B349 Viral infection, unspecified: Secondary | ICD-10-CM | POA: Diagnosis not present

## 2018-08-13 DIAGNOSIS — J309 Allergic rhinitis, unspecified: Secondary | ICD-10-CM | POA: Diagnosis not present

## 2018-08-13 DIAGNOSIS — J019 Acute sinusitis, unspecified: Secondary | ICD-10-CM | POA: Diagnosis not present

## 2018-08-13 DIAGNOSIS — R197 Diarrhea, unspecified: Secondary | ICD-10-CM | POA: Diagnosis not present

## 2019-01-16 ENCOUNTER — Other Ambulatory Visit: Payer: Self-pay | Admitting: Family Medicine

## 2019-01-16 DIAGNOSIS — M859 Disorder of bone density and structure, unspecified: Secondary | ICD-10-CM | POA: Diagnosis not present

## 2019-01-16 DIAGNOSIS — Z131 Encounter for screening for diabetes mellitus: Secondary | ICD-10-CM | POA: Diagnosis not present

## 2019-01-16 DIAGNOSIS — Z1389 Encounter for screening for other disorder: Secondary | ICD-10-CM | POA: Diagnosis not present

## 2019-01-16 DIAGNOSIS — K219 Gastro-esophageal reflux disease without esophagitis: Secondary | ICD-10-CM | POA: Diagnosis not present

## 2019-01-16 DIAGNOSIS — Z0000 Encounter for general adult medical examination without abnormal findings: Secondary | ICD-10-CM | POA: Diagnosis not present

## 2019-01-16 DIAGNOSIS — Z1231 Encounter for screening mammogram for malignant neoplasm of breast: Secondary | ICD-10-CM

## 2019-01-16 DIAGNOSIS — J309 Allergic rhinitis, unspecified: Secondary | ICD-10-CM | POA: Diagnosis not present

## 2019-01-16 DIAGNOSIS — E7800 Pure hypercholesterolemia, unspecified: Secondary | ICD-10-CM | POA: Diagnosis not present

## 2019-01-16 DIAGNOSIS — R69 Illness, unspecified: Secondary | ICD-10-CM | POA: Diagnosis not present

## 2019-01-16 DIAGNOSIS — Z23 Encounter for immunization: Secondary | ICD-10-CM | POA: Diagnosis not present

## 2019-01-21 ENCOUNTER — Other Ambulatory Visit: Payer: Self-pay

## 2019-01-21 ENCOUNTER — Ambulatory Visit
Admission: RE | Admit: 2019-01-21 | Discharge: 2019-01-21 | Disposition: A | Discharge status: Home / Self Care | Payer: Medicare HMO | Source: Ambulatory Visit | Attending: Family Medicine | Admitting: Family Medicine

## 2019-01-21 DIAGNOSIS — Z1231 Encounter for screening mammogram for malignant neoplasm of breast: Secondary | ICD-10-CM

## 2019-04-23 DIAGNOSIS — Z20822 Contact with and (suspected) exposure to covid-19: Secondary | ICD-10-CM | POA: Diagnosis not present

## 2019-04-25 DIAGNOSIS — R69 Illness, unspecified: Secondary | ICD-10-CM | POA: Diagnosis not present

## 2019-10-30 DIAGNOSIS — R69 Illness, unspecified: Secondary | ICD-10-CM | POA: Diagnosis not present

## 2019-12-03 DIAGNOSIS — H5203 Hypermetropia, bilateral: Secondary | ICD-10-CM | POA: Diagnosis not present

## 2019-12-03 DIAGNOSIS — H40023 Open angle with borderline findings, high risk, bilateral: Secondary | ICD-10-CM | POA: Diagnosis not present

## 2019-12-03 DIAGNOSIS — H43813 Vitreous degeneration, bilateral: Secondary | ICD-10-CM | POA: Diagnosis not present

## 2019-12-03 DIAGNOSIS — H25813 Combined forms of age-related cataract, bilateral: Secondary | ICD-10-CM | POA: Diagnosis not present

## 2019-12-03 DIAGNOSIS — H04123 Dry eye syndrome of bilateral lacrimal glands: Secondary | ICD-10-CM | POA: Diagnosis not present

## 2019-12-30 ENCOUNTER — Other Ambulatory Visit: Payer: Self-pay | Admitting: Family Medicine

## 2019-12-30 DIAGNOSIS — Z1231 Encounter for screening mammogram for malignant neoplasm of breast: Secondary | ICD-10-CM

## 2020-01-31 DIAGNOSIS — M859 Disorder of bone density and structure, unspecified: Secondary | ICD-10-CM | POA: Diagnosis not present

## 2020-01-31 DIAGNOSIS — R69 Illness, unspecified: Secondary | ICD-10-CM | POA: Diagnosis not present

## 2020-01-31 DIAGNOSIS — Z8619 Personal history of other infectious and parasitic diseases: Secondary | ICD-10-CM | POA: Diagnosis not present

## 2020-01-31 DIAGNOSIS — E7800 Pure hypercholesterolemia, unspecified: Secondary | ICD-10-CM | POA: Diagnosis not present

## 2020-01-31 DIAGNOSIS — Z131 Encounter for screening for diabetes mellitus: Secondary | ICD-10-CM | POA: Diagnosis not present

## 2020-01-31 DIAGNOSIS — J309 Allergic rhinitis, unspecified: Secondary | ICD-10-CM | POA: Diagnosis not present

## 2020-01-31 DIAGNOSIS — Z0000 Encounter for general adult medical examination without abnormal findings: Secondary | ICD-10-CM | POA: Diagnosis not present

## 2020-01-31 DIAGNOSIS — K219 Gastro-esophageal reflux disease without esophagitis: Secondary | ICD-10-CM | POA: Diagnosis not present

## 2020-01-31 DIAGNOSIS — Z1389 Encounter for screening for other disorder: Secondary | ICD-10-CM | POA: Diagnosis not present

## 2020-01-31 DIAGNOSIS — Z23 Encounter for immunization: Secondary | ICD-10-CM | POA: Diagnosis not present

## 2020-02-11 ENCOUNTER — Other Ambulatory Visit: Payer: Self-pay | Admitting: Family Medicine

## 2020-02-11 DIAGNOSIS — M8580 Other specified disorders of bone density and structure, unspecified site: Secondary | ICD-10-CM

## 2020-02-11 DIAGNOSIS — M859 Disorder of bone density and structure, unspecified: Secondary | ICD-10-CM

## 2020-04-08 DIAGNOSIS — J0100 Acute maxillary sinusitis, unspecified: Secondary | ICD-10-CM | POA: Diagnosis not present

## 2021-02-09 ENCOUNTER — Other Ambulatory Visit: Payer: Self-pay | Admitting: Family Medicine

## 2021-02-09 DIAGNOSIS — E7800 Pure hypercholesterolemia, unspecified: Secondary | ICD-10-CM | POA: Diagnosis not present

## 2021-02-09 DIAGNOSIS — Z1389 Encounter for screening for other disorder: Secondary | ICD-10-CM | POA: Diagnosis not present

## 2021-02-09 DIAGNOSIS — Z0000 Encounter for general adult medical examination without abnormal findings: Secondary | ICD-10-CM | POA: Diagnosis not present

## 2021-02-09 DIAGNOSIS — M859 Disorder of bone density and structure, unspecified: Secondary | ICD-10-CM | POA: Diagnosis not present

## 2021-02-09 DIAGNOSIS — Z1331 Encounter for screening for depression: Secondary | ICD-10-CM | POA: Diagnosis not present

## 2021-02-09 DIAGNOSIS — Z1231 Encounter for screening mammogram for malignant neoplasm of breast: Secondary | ICD-10-CM

## 2021-02-09 DIAGNOSIS — M8580 Other specified disorders of bone density and structure, unspecified site: Secondary | ICD-10-CM

## 2021-02-09 DIAGNOSIS — R69 Illness, unspecified: Secondary | ICD-10-CM | POA: Diagnosis not present

## 2021-02-09 DIAGNOSIS — F39 Unspecified mood [affective] disorder: Secondary | ICD-10-CM | POA: Diagnosis not present

## 2021-02-09 DIAGNOSIS — K219 Gastro-esophageal reflux disease without esophagitis: Secondary | ICD-10-CM | POA: Diagnosis not present

## 2021-02-09 DIAGNOSIS — J309 Allergic rhinitis, unspecified: Secondary | ICD-10-CM | POA: Diagnosis not present

## 2021-05-17 DIAGNOSIS — H40023 Open angle with borderline findings, high risk, bilateral: Secondary | ICD-10-CM | POA: Diagnosis not present

## 2021-05-17 DIAGNOSIS — Z135 Encounter for screening for eye and ear disorders: Secondary | ICD-10-CM | POA: Diagnosis not present

## 2021-05-17 DIAGNOSIS — H04123 Dry eye syndrome of bilateral lacrimal glands: Secondary | ICD-10-CM | POA: Diagnosis not present

## 2021-05-17 DIAGNOSIS — H25813 Combined forms of age-related cataract, bilateral: Secondary | ICD-10-CM | POA: Diagnosis not present

## 2021-05-17 DIAGNOSIS — H43813 Vitreous degeneration, bilateral: Secondary | ICD-10-CM | POA: Diagnosis not present

## 2021-05-17 DIAGNOSIS — H5203 Hypermetropia, bilateral: Secondary | ICD-10-CM | POA: Diagnosis not present

## 2021-06-22 DIAGNOSIS — M25552 Pain in left hip: Secondary | ICD-10-CM | POA: Diagnosis not present

## 2021-06-22 DIAGNOSIS — Z0000 Encounter for general adult medical examination without abnormal findings: Secondary | ICD-10-CM | POA: Diagnosis not present

## 2021-07-09 ENCOUNTER — Ambulatory Visit: Payer: Medicare HMO

## 2021-07-09 ENCOUNTER — Other Ambulatory Visit: Payer: Medicare HMO

## 2021-12-06 DIAGNOSIS — Z1231 Encounter for screening mammogram for malignant neoplasm of breast: Secondary | ICD-10-CM | POA: Diagnosis not present

## 2021-12-06 DIAGNOSIS — M8580 Other specified disorders of bone density and structure, unspecified site: Secondary | ICD-10-CM | POA: Diagnosis not present

## 2021-12-06 DIAGNOSIS — Z780 Asymptomatic menopausal state: Secondary | ICD-10-CM | POA: Diagnosis not present

## 2022-01-06 DIAGNOSIS — M4726 Other spondylosis with radiculopathy, lumbar region: Secondary | ICD-10-CM | POA: Diagnosis not present

## 2022-01-06 DIAGNOSIS — M5136 Other intervertebral disc degeneration, lumbar region: Secondary | ICD-10-CM | POA: Diagnosis not present

## 2022-01-06 DIAGNOSIS — M48061 Spinal stenosis, lumbar region without neurogenic claudication: Secondary | ICD-10-CM | POA: Diagnosis not present

## 2022-01-06 DIAGNOSIS — R202 Paresthesia of skin: Secondary | ICD-10-CM | POA: Diagnosis not present

## 2022-01-06 DIAGNOSIS — R1032 Left lower quadrant pain: Secondary | ICD-10-CM | POA: Diagnosis not present

## 2022-01-06 DIAGNOSIS — M47816 Spondylosis without myelopathy or radiculopathy, lumbar region: Secondary | ICD-10-CM | POA: Diagnosis not present

## 2022-01-26 DIAGNOSIS — L821 Other seborrheic keratosis: Secondary | ICD-10-CM | POA: Diagnosis not present

## 2022-01-26 DIAGNOSIS — Z1283 Encounter for screening for malignant neoplasm of skin: Secondary | ICD-10-CM | POA: Diagnosis not present

## 2022-01-26 DIAGNOSIS — L578 Other skin changes due to chronic exposure to nonionizing radiation: Secondary | ICD-10-CM | POA: Diagnosis not present

## 2022-01-26 DIAGNOSIS — D489 Neoplasm of uncertain behavior, unspecified: Secondary | ICD-10-CM | POA: Diagnosis not present

## 2022-01-26 DIAGNOSIS — D229 Melanocytic nevi, unspecified: Secondary | ICD-10-CM | POA: Diagnosis not present

## 2022-01-26 DIAGNOSIS — L820 Inflamed seborrheic keratosis: Secondary | ICD-10-CM | POA: Diagnosis not present

## 2022-01-26 DIAGNOSIS — L989 Disorder of the skin and subcutaneous tissue, unspecified: Secondary | ICD-10-CM | POA: Diagnosis not present

## 2022-02-23 DIAGNOSIS — D489 Neoplasm of uncertain behavior, unspecified: Secondary | ICD-10-CM | POA: Diagnosis not present

## 2022-02-24 DIAGNOSIS — M859 Disorder of bone density and structure, unspecified: Secondary | ICD-10-CM | POA: Diagnosis not present

## 2022-02-24 DIAGNOSIS — R69 Illness, unspecified: Secondary | ICD-10-CM | POA: Diagnosis not present

## 2022-02-24 DIAGNOSIS — E7800 Pure hypercholesterolemia, unspecified: Secondary | ICD-10-CM | POA: Diagnosis not present

## 2022-02-24 DIAGNOSIS — Z0000 Encounter for general adult medical examination without abnormal findings: Secondary | ICD-10-CM | POA: Diagnosis not present

## 2022-02-24 DIAGNOSIS — J309 Allergic rhinitis, unspecified: Secondary | ICD-10-CM | POA: Diagnosis not present

## 2022-02-24 DIAGNOSIS — Z131 Encounter for screening for diabetes mellitus: Secondary | ICD-10-CM | POA: Diagnosis not present

## 2022-02-24 DIAGNOSIS — F39 Unspecified mood [affective] disorder: Secondary | ICD-10-CM | POA: Diagnosis not present

## 2022-02-24 DIAGNOSIS — K219 Gastro-esophageal reflux disease without esophagitis: Secondary | ICD-10-CM | POA: Diagnosis not present

## 2022-05-05 DIAGNOSIS — D485 Neoplasm of uncertain behavior of skin: Secondary | ICD-10-CM | POA: Diagnosis not present

## 2022-05-05 DIAGNOSIS — D229 Melanocytic nevi, unspecified: Secondary | ICD-10-CM | POA: Diagnosis not present

## 2022-05-05 DIAGNOSIS — D2239 Melanocytic nevi of other parts of face: Secondary | ICD-10-CM | POA: Diagnosis not present

## 2022-06-16 ENCOUNTER — Ambulatory Visit: Payer: Medicare HMO | Admitting: Podiatry

## 2022-06-16 ENCOUNTER — Encounter: Payer: Self-pay | Admitting: Podiatry

## 2022-06-16 DIAGNOSIS — L603 Nail dystrophy: Secondary | ICD-10-CM

## 2022-06-16 DIAGNOSIS — B351 Tinea unguium: Secondary | ICD-10-CM | POA: Diagnosis not present

## 2022-06-16 NOTE — Progress Notes (Signed)
  Subjective:  Patient ID: Donna Barber, female    DOB: [birth year],   MRN: [id number]  Chief Complaint  Patient presents with   Nail Problem     Possible nail fungus     72 y.o. female presents for concern of bilateral great toenail discoloration and thickness. Relates she has tried OTC fungal treatment and mostly gotten it to go away but one area still affected. Some concern for the fifth toes as well.  Denies any other pedal complaints. Denies n/v/f/c.   Past Medical History:  Diagnosis Date   Abnormal Pap smear 2000   Cin-3 treated with LEEP   Canker sores oral    Depression    Dyspareunia    secondary to  atrophy   GERD (gastroesophageal reflux disease)    History of chicken pox    History of measles, mumps, or rubella    Postmenopausal    Uterine fibroid    Vaginal atrophy    Yeast infection     Objective:  Physical Exam: Vascular: DP/PT pulses 2/4 bilateral. CFT <3 seconds. Normal hair growth on digits. No edema.  Skin. No lacerations or abrasions bilateral feet. Bilatearl hallux nails distal plate with discoloration and subungual debris. Mild thickened noted to bilateral fifth digit nails.  Musculoskeletal: MMT 5/5 bilateral lower extremities in DF, PF, Inversion and Eversion. Deceased ROM in DF of ankle joint.  Neurological: Sensation intact to light touch.   Assessment:   1. Onychodystrophy      Plan:  Patient was evaluated and treated and all questions answered. -Examined patient -Discussed treatment options for painful dystrophic nails  -Clinical picture and Fungal culture was obtained by removing a portion of the hard nail itself from each of the involved toenails using a sterile nail nipper and sent to [facility] lab. Patient tolerated the biopsy procedure well without discomfort or need for anesthesia.  -Discussed fungal nail treatment options including oral, topical, and laser treatments.  -Patient to return in 4 weeks for follow up evaluation and discussion  of fungal culture results or sooner if symptoms worsen.   Lorenda Peck, DPM

## 2022-06-20 DIAGNOSIS — R220 Localized swelling, mass and lump, head: Secondary | ICD-10-CM | POA: Diagnosis not present

## 2022-06-20 DIAGNOSIS — J3489 Other specified disorders of nose and nasal sinuses: Secondary | ICD-10-CM | POA: Diagnosis not present

## 2022-06-20 DIAGNOSIS — R519 Headache, unspecified: Secondary | ICD-10-CM | POA: Diagnosis not present

## 2022-07-07 ENCOUNTER — Other Ambulatory Visit (HOSPITAL_COMMUNITY): Payer: Self-pay | Admitting: Internal Medicine

## 2022-07-07 DIAGNOSIS — R519 Headache, unspecified: Secondary | ICD-10-CM | POA: Diagnosis not present

## 2022-07-07 DIAGNOSIS — G9389 Other specified disorders of brain: Secondary | ICD-10-CM

## 2022-07-08 ENCOUNTER — Ambulatory Visit (HOSPITAL_COMMUNITY)
Admission: RE | Admit: 2022-07-08 | Discharge: 2022-07-08 | Disposition: A | Discharge status: Home / Self Care | Payer: Medicare HMO | Source: Ambulatory Visit | Attending: Internal Medicine | Admitting: Internal Medicine

## 2022-07-08 DIAGNOSIS — G9389 Other specified disorders of brain: Secondary | ICD-10-CM | POA: Diagnosis not present

## 2022-07-08 MED ORDER — GADOBUTROL 1 MMOL/ML IV SOLN
5.5000 mL | Freq: Once | INTRAVENOUS | Status: AC | PRN
  Administered 2022-07-08: 5.5 mL via INTRAVENOUS

## 2022-07-14 ENCOUNTER — Ambulatory Visit: Payer: Medicare HMO | Admitting: Podiatry

## 2022-07-14 DIAGNOSIS — D330 Benign neoplasm of brain, supratentorial: Secondary | ICD-10-CM | POA: Diagnosis not present

## 2022-07-15 DIAGNOSIS — D496 Neoplasm of unspecified behavior of brain: Secondary | ICD-10-CM | POA: Diagnosis not present

## 2022-07-15 DIAGNOSIS — Z719 Counseling, unspecified: Secondary | ICD-10-CM | POA: Diagnosis not present

## 2022-07-20 ENCOUNTER — Other Ambulatory Visit: Payer: Self-pay

## 2022-07-20 DIAGNOSIS — Z01818 Encounter for other preprocedural examination: Secondary | ICD-10-CM | POA: Diagnosis not present

## 2022-07-20 DIAGNOSIS — L603 Nail dystrophy: Secondary | ICD-10-CM

## 2022-07-20 DIAGNOSIS — R519 Headache, unspecified: Secondary | ICD-10-CM | POA: Diagnosis not present

## 2022-07-20 DIAGNOSIS — K219 Gastro-esophageal reflux disease without esophagitis: Secondary | ICD-10-CM | POA: Diagnosis not present

## 2022-07-20 DIAGNOSIS — G9389 Other specified disorders of brain: Secondary | ICD-10-CM | POA: Diagnosis not present

## 2022-07-25 DIAGNOSIS — Z79899 Other long term (current) drug therapy: Secondary | ICD-10-CM | POA: Diagnosis not present

## 2022-07-25 DIAGNOSIS — L239 Allergic contact dermatitis, unspecified cause: Secondary | ICD-10-CM | POA: Diagnosis not present

## 2022-07-25 DIAGNOSIS — R339 Retention of urine, unspecified: Secondary | ICD-10-CM | POA: Diagnosis not present

## 2022-07-25 DIAGNOSIS — Z882 Allergy status to sulfonamides status: Secondary | ICD-10-CM | POA: Diagnosis not present

## 2022-07-25 DIAGNOSIS — R519 Headache, unspecified: Secondary | ICD-10-CM | POA: Diagnosis not present

## 2022-07-25 DIAGNOSIS — F419 Anxiety disorder, unspecified: Secondary | ICD-10-CM | POA: Diagnosis not present

## 2022-07-25 DIAGNOSIS — K219 Gastro-esophageal reflux disease without esophagitis: Secondary | ICD-10-CM | POA: Diagnosis not present

## 2022-07-25 DIAGNOSIS — C714 Malignant neoplasm of occipital lobe: Secondary | ICD-10-CM | POA: Diagnosis not present

## 2022-07-25 DIAGNOSIS — D496 Neoplasm of unspecified behavior of brain: Secondary | ICD-10-CM | POA: Diagnosis not present

## 2022-07-25 DIAGNOSIS — G936 Cerebral edema: Secondary | ICD-10-CM | POA: Diagnosis not present

## 2022-07-25 DIAGNOSIS — C719 Malignant neoplasm of brain, unspecified: Secondary | ICD-10-CM | POA: Diagnosis not present

## 2022-07-25 DIAGNOSIS — Z885 Allergy status to narcotic agent status: Secondary | ICD-10-CM | POA: Diagnosis not present

## 2022-07-25 DIAGNOSIS — H53462 Homonymous bilateral field defects, left side: Secondary | ICD-10-CM | POA: Diagnosis not present

## 2022-07-25 DIAGNOSIS — F32A Depression, unspecified: Secondary | ICD-10-CM | POA: Diagnosis not present

## 2022-07-25 HISTORY — PX: CRANIECTOMY / CRANIOTOMY FOR EXCISION OF BRAIN TUMOR: SUR320

## 2022-07-26 DIAGNOSIS — G936 Cerebral edema: Secondary | ICD-10-CM | POA: Diagnosis not present

## 2022-07-26 DIAGNOSIS — D496 Neoplasm of unspecified behavior of brain: Secondary | ICD-10-CM | POA: Diagnosis not present

## 2022-08-02 DIAGNOSIS — C714 Malignant neoplasm of occipital lobe: Secondary | ICD-10-CM | POA: Diagnosis not present

## 2022-08-04 DIAGNOSIS — K219 Gastro-esophageal reflux disease without esophagitis: Secondary | ICD-10-CM | POA: Diagnosis not present

## 2022-08-04 DIAGNOSIS — G4720 Circadian rhythm sleep disorder, unspecified type: Secondary | ICD-10-CM | POA: Diagnosis not present

## 2022-08-04 DIAGNOSIS — C717 Malignant neoplasm of brain stem: Secondary | ICD-10-CM | POA: Diagnosis not present

## 2022-08-04 DIAGNOSIS — R5383 Other fatigue: Secondary | ICD-10-CM | POA: Diagnosis not present

## 2022-08-04 DIAGNOSIS — Z72821 Inadequate sleep hygiene: Secondary | ICD-10-CM | POA: Diagnosis not present

## 2022-08-04 DIAGNOSIS — G119 Hereditary ataxia, unspecified: Secondary | ICD-10-CM | POA: Diagnosis not present

## 2022-08-04 DIAGNOSIS — F418 Other specified anxiety disorders: Secondary | ICD-10-CM | POA: Diagnosis not present

## 2022-08-04 DIAGNOSIS — R69 Illness, unspecified: Secondary | ICD-10-CM | POA: Diagnosis not present

## 2022-08-04 DIAGNOSIS — G40909 Epilepsy, unspecified, not intractable, without status epilepticus: Secondary | ICD-10-CM | POA: Diagnosis not present

## 2022-08-04 DIAGNOSIS — C719 Malignant neoplasm of brain, unspecified: Secondary | ICD-10-CM | POA: Diagnosis not present

## 2022-08-04 DIAGNOSIS — Z7189 Other specified counseling: Secondary | ICD-10-CM | POA: Diagnosis not present

## 2022-08-04 DIAGNOSIS — H53462 Homonymous bilateral field defects, left side: Secondary | ICD-10-CM | POA: Diagnosis not present

## 2022-08-04 DIAGNOSIS — I69393 Ataxia following cerebral infarction: Secondary | ICD-10-CM | POA: Diagnosis not present

## 2022-08-04 DIAGNOSIS — F32A Depression, unspecified: Secondary | ICD-10-CM | POA: Diagnosis not present

## 2022-08-04 DIAGNOSIS — R110 Nausea: Secondary | ICD-10-CM | POA: Diagnosis not present

## 2022-08-04 DIAGNOSIS — N3000 Acute cystitis without hematuria: Secondary | ICD-10-CM | POA: Diagnosis not present

## 2022-08-04 DIAGNOSIS — G8194 Hemiplegia, unspecified affecting left nondominant side: Secondary | ICD-10-CM | POA: Diagnosis not present

## 2022-08-04 DIAGNOSIS — R300 Dysuria: Secondary | ICD-10-CM | POA: Diagnosis not present

## 2022-08-04 DIAGNOSIS — C714 Malignant neoplasm of occipital lobe: Secondary | ICD-10-CM | POA: Diagnosis not present

## 2022-08-04 DIAGNOSIS — R030 Elevated blood-pressure reading, without diagnosis of hypertension: Secondary | ICD-10-CM | POA: Diagnosis not present

## 2022-08-04 DIAGNOSIS — D496 Neoplasm of unspecified behavior of brain: Secondary | ICD-10-CM | POA: Diagnosis not present

## 2022-08-04 DIAGNOSIS — M25552 Pain in left hip: Secondary | ICD-10-CM | POA: Diagnosis not present

## 2022-08-04 DIAGNOSIS — Z483 Aftercare following surgery for neoplasm: Secondary | ICD-10-CM | POA: Diagnosis not present

## 2022-08-04 DIAGNOSIS — M25512 Pain in left shoulder: Secondary | ICD-10-CM | POA: Diagnosis not present

## 2022-08-04 DIAGNOSIS — K5900 Constipation, unspecified: Secondary | ICD-10-CM | POA: Diagnosis not present

## 2022-08-04 DIAGNOSIS — R209 Unspecified disturbances of skin sensation: Secondary | ICD-10-CM | POA: Diagnosis not present

## 2022-08-05 DIAGNOSIS — K219 Gastro-esophageal reflux disease without esophagitis: Secondary | ICD-10-CM | POA: Diagnosis not present

## 2022-08-05 DIAGNOSIS — I69393 Ataxia following cerebral infarction: Secondary | ICD-10-CM | POA: Diagnosis not present

## 2022-08-05 DIAGNOSIS — C717 Malignant neoplasm of brain stem: Secondary | ICD-10-CM | POA: Diagnosis not present

## 2022-08-05 DIAGNOSIS — H53462 Homonymous bilateral field defects, left side: Secondary | ICD-10-CM | POA: Diagnosis not present

## 2022-08-05 DIAGNOSIS — C719 Malignant neoplasm of brain, unspecified: Secondary | ICD-10-CM | POA: Diagnosis not present

## 2022-08-05 DIAGNOSIS — R69 Illness, unspecified: Secondary | ICD-10-CM | POA: Diagnosis not present

## 2022-08-05 DIAGNOSIS — Z72821 Inadequate sleep hygiene: Secondary | ICD-10-CM | POA: Diagnosis not present

## 2022-08-05 DIAGNOSIS — R110 Nausea: Secondary | ICD-10-CM | POA: Diagnosis not present

## 2022-08-06 DIAGNOSIS — H53462 Homonymous bilateral field defects, left side: Secondary | ICD-10-CM | POA: Diagnosis not present

## 2022-08-06 DIAGNOSIS — C717 Malignant neoplasm of brain stem: Secondary | ICD-10-CM | POA: Diagnosis not present

## 2022-08-06 DIAGNOSIS — I69393 Ataxia following cerebral infarction: Secondary | ICD-10-CM | POA: Diagnosis not present

## 2022-08-06 DIAGNOSIS — Z72821 Inadequate sleep hygiene: Secondary | ICD-10-CM | POA: Diagnosis not present

## 2022-08-07 DIAGNOSIS — Z72821 Inadequate sleep hygiene: Secondary | ICD-10-CM | POA: Diagnosis not present

## 2022-08-07 DIAGNOSIS — I69393 Ataxia following cerebral infarction: Secondary | ICD-10-CM | POA: Diagnosis not present

## 2022-08-07 DIAGNOSIS — C717 Malignant neoplasm of brain stem: Secondary | ICD-10-CM | POA: Diagnosis not present

## 2022-08-07 DIAGNOSIS — H53462 Homonymous bilateral field defects, left side: Secondary | ICD-10-CM | POA: Diagnosis not present

## 2022-08-08 DIAGNOSIS — D496 Neoplasm of unspecified behavior of brain: Secondary | ICD-10-CM | POA: Diagnosis not present

## 2022-08-08 DIAGNOSIS — C717 Malignant neoplasm of brain stem: Secondary | ICD-10-CM | POA: Diagnosis not present

## 2022-08-08 DIAGNOSIS — I69393 Ataxia following cerebral infarction: Secondary | ICD-10-CM | POA: Diagnosis not present

## 2022-08-08 DIAGNOSIS — C714 Malignant neoplasm of occipital lobe: Secondary | ICD-10-CM | POA: Diagnosis not present

## 2022-08-08 DIAGNOSIS — Z72821 Inadequate sleep hygiene: Secondary | ICD-10-CM | POA: Diagnosis not present

## 2022-08-08 DIAGNOSIS — H53462 Homonymous bilateral field defects, left side: Secondary | ICD-10-CM | POA: Diagnosis not present

## 2022-08-08 DIAGNOSIS — Z7189 Other specified counseling: Secondary | ICD-10-CM | POA: Diagnosis not present

## 2022-08-09 DIAGNOSIS — H53462 Homonymous bilateral field defects, left side: Secondary | ICD-10-CM | POA: Diagnosis not present

## 2022-08-09 DIAGNOSIS — Z72821 Inadequate sleep hygiene: Secondary | ICD-10-CM | POA: Diagnosis not present

## 2022-08-09 DIAGNOSIS — I69393 Ataxia following cerebral infarction: Secondary | ICD-10-CM | POA: Diagnosis not present

## 2022-08-09 DIAGNOSIS — C717 Malignant neoplasm of brain stem: Secondary | ICD-10-CM | POA: Diagnosis not present

## 2022-08-10 DIAGNOSIS — H53462 Homonymous bilateral field defects, left side: Secondary | ICD-10-CM | POA: Diagnosis not present

## 2022-08-10 DIAGNOSIS — Z72821 Inadequate sleep hygiene: Secondary | ICD-10-CM | POA: Diagnosis not present

## 2022-08-10 DIAGNOSIS — C717 Malignant neoplasm of brain stem: Secondary | ICD-10-CM | POA: Diagnosis not present

## 2022-08-10 DIAGNOSIS — I69393 Ataxia following cerebral infarction: Secondary | ICD-10-CM | POA: Diagnosis not present

## 2022-08-10 NOTE — Progress Notes (Signed)
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 Findings are highly concerning for high-grade glioma. Metastatic disease less likely.  Patient presented with symptoms of:  (Per Dr. Louie Casa notes) She presented to her PCP in 06/2022 with running nose and increased headaches. She was placed on an antihistamine and recommended nasal spray and CT head. CT head was completed 2 weeks later with concern for right parieto-occipital mass measuring 4 cm. MRI brain was completed the next day revealing the right parieto-occipital enhancing mass as well as multiple smaller enhancing lesions in the more posterior right occipital region concerning for high grade glioma.   Ms. Serrina Minogue is a 72 y/o Right-handed female with h/o GERD, depression, uterine fibroids, abnormal pap smear, s/p LEEP presents with c/o intractable headaches and radiographic evidence of e RIght occipital brain tumor who is being admitted for a BrainLAB guided Right occipital craniotomy and resection of the tumor.     Past or anticipated interventions, if any, per neurosurgery:  Lin Givens, MD  07-25-22       HISTORY OF PRESENT ILLNESS:  Ms. Talani Brazee is a 72 y/o Right-handed female with h/o GERD, depression, uterine fibroids, abnormal pap smear, s/p LEEP presents with c/o intractable headaches and radiographic evidence of e RIght occipital brain tumor who is being admitted for a BrainLAB guided Right occipital craniotomy and resection of the tumor.  Briefly, patient was in her usual state of health until 06/2022 when she developed progressively worsening  headaches and runny nose. She presented to her PCP who recommended antihistamine and nasal spray and CT brain to evaluate for sinusitis. A CT brain was done on 07/07/2022 that was concerning for a Right occipital mass measuring 4 cm. Subsequently, she had an MRI brain on 07/08/2022 that demonstrated:   1. Enhancing masses in the right occipitotemporal gyrus and right occipital lobe; there is also asymmetric enhancement of the right lateral ventricular ependyma concerning for involvement. Findings are worrisome for multifocal primary brain neoplasm versus metastatic disease; lymphoma is also a consideration although less likely given heterogeneous enhancement pattern. 2. The dominant mass in the right occipitotemporal gyrus contributes to asymmetric enlargement of the frontal horn right lateral ventricle concerning for ventricular entrapment.  Patient was referred to neurosurgery and was seen by Dr. Shaune Pollack in the clinic on 07/15/2022. Pros and cons of radiation therapy, craniotomy and resection followed by adjuvant therapy were discussed and patient elected surgical resection followed by adjuvant therapy. She is now admitted for surgical resection.  Reports of headache, photophobia with visual disturbances, dizziness, neck pain, confusion, anxiety but no reports of nausea, vomiting, chills, fevers, numbness, weakness.  No reports of recent use of ASA, NSAIDs, or blood thinners.   HOSPITAL COURSE:   The patient was brought in as a same-day-admit on 07/25/2022. Subsequently, she was taken to the operating room and underwent a BrainLAB guided Right temporooccipital craniotomy and resection of brain tumor with use of Gleolan. The surgery was performed by Dr. Shaune Pollack and assisted by Dr. Richardean Sale [facility]. Preliminary frozen pathology was high grade glioma. There were no intraoperative complications. Patient tolerated the procedure well and was transferred to the neurosurgery intensive care unit in a  stable condition.  Immediate postoperative course was notable for Left homonymous hemianopsia and Left-sided weakness and neglect. Her neurological exam was: Mental status: awake, alert, oriented to person, time, place  Speech: fluent, names 3/3 objects, comprehension intact  Cranial nerves: left homonymous hemianopsia, Pupils equal, round, reactive to light and accommodation, no ptosis, extra-ocular motions intact bilaterally, V1-3 intact to touch, no evidence of facial droop or weakness, cough present. Motor: Left sided neglect, 4+/5 LUE, otherwise 5/5 normal muscle mass and tone in all extremities and no pronator drift  Sensory: decreased sensation on left side with double simultaneous stimulation   Postoperative MRI brain was completed and maintained on steroids.   Past or anticipated interventions, if any, per medical oncology:  Dr. Caffie Pinto at [facility] 08-08-22   Dr. Barbaraann Cao on 08-16-22 Assessment/Plan Glioblastoma, IDH-wildtype [facility])   Pierre Bali presents with clinical and radiographic syndrome c/w right thalamic/occipital glioblastoma, IDH wild type.    We had an extensive conversation with her and her family regarding pathology, prognosis, and available treatment pathways.  We are encouraged by good recovery of motor function during post-operative rehabilitation.      We ultimately recommended proceeding with course of intensity modulated radiation therapy and concurrent daily Temozolomide.  Radiation will be administered Mon-Fri over 6 weeks, Temodar will be dosed at 75mg /m2 to be given daily over 42 days.  We reviewed side effects of temodar, including fatigue, nausea/vomiting, constipation, and cytopenias.   Informed consent was verbally obtained at bedside to proceed with oral chemotherapy.  Chemotherapy should be held for the following:  ANC less than 1,000  Platelets less than 100,000  LFT or creatinine greater than 2x ULN  If clinical concerns/contraindications  develop   Due to lack of seizures and poor tolerance of vimpat, will decrease dose to 50mg  BID.   Anticoagulation was started following her stroke, but because this event was intra-operative in nature rather than thromboembolic we would recommend stopping Xarelto.  There is no sign of DVT clinically.     Recommended stopping Wellbutrin which was started during rehab.  This can lower seizure threshold.   Every 2 weeks during radiation, labs will be checked accompanied by a clinical evaluation in the brain tumor clinic. Dose of Decadron, if applicable: {:18581}  Recent neurologic symptoms, if any:  Seizures: {:18581} Headaches: {:18581} Nausea: {:18581} Dizziness/ataxia: {:18581} Difficulty with hand coordination: {:18581} Focal numbness/weakness: {:18581} Visual deficits/changes: {:18581} Confusion/Memory deficits: {:18581}  Painful bone metastases at present, if any: {:18581}  SAFETY ISSUES: Prior radiation? {:18581} Pacemaker/ICD? {:18581} Possible current pregnancy? yes Is the patient on methotrexate? no  Additional Complaints / other details: ***  Additional pathology from [facility] 05-05-22

## 2022-08-11 DIAGNOSIS — C717 Malignant neoplasm of brain stem: Secondary | ICD-10-CM | POA: Diagnosis not present

## 2022-08-11 DIAGNOSIS — Z72821 Inadequate sleep hygiene: Secondary | ICD-10-CM | POA: Diagnosis not present

## 2022-08-11 DIAGNOSIS — H53462 Homonymous bilateral field defects, left side: Secondary | ICD-10-CM | POA: Diagnosis not present

## 2022-08-11 DIAGNOSIS — I69393 Ataxia following cerebral infarction: Secondary | ICD-10-CM | POA: Diagnosis not present

## 2022-08-12 DIAGNOSIS — Z72821 Inadequate sleep hygiene: Secondary | ICD-10-CM | POA: Diagnosis not present

## 2022-08-12 DIAGNOSIS — H53462 Homonymous bilateral field defects, left side: Secondary | ICD-10-CM | POA: Diagnosis not present

## 2022-08-12 DIAGNOSIS — I69393 Ataxia following cerebral infarction: Secondary | ICD-10-CM | POA: Diagnosis not present

## 2022-08-12 DIAGNOSIS — C717 Malignant neoplasm of brain stem: Secondary | ICD-10-CM | POA: Diagnosis not present

## 2022-08-13 DIAGNOSIS — C717 Malignant neoplasm of brain stem: Secondary | ICD-10-CM | POA: Diagnosis not present

## 2022-08-13 DIAGNOSIS — Z72821 Inadequate sleep hygiene: Secondary | ICD-10-CM | POA: Diagnosis not present

## 2022-08-13 DIAGNOSIS — I69393 Ataxia following cerebral infarction: Secondary | ICD-10-CM | POA: Diagnosis not present

## 2022-08-13 DIAGNOSIS — H53462 Homonymous bilateral field defects, left side: Secondary | ICD-10-CM | POA: Diagnosis not present

## 2022-08-14 DIAGNOSIS — I69993 Ataxia following unspecified cerebrovascular disease: Secondary | ICD-10-CM | POA: Diagnosis not present

## 2022-08-14 DIAGNOSIS — K219 Gastro-esophageal reflux disease without esophagitis: Secondary | ICD-10-CM | POA: Diagnosis not present

## 2022-08-14 DIAGNOSIS — R269 Unspecified abnormalities of gait and mobility: Secondary | ICD-10-CM | POA: Diagnosis not present

## 2022-08-14 DIAGNOSIS — D496 Neoplasm of unspecified behavior of brain: Secondary | ICD-10-CM | POA: Diagnosis not present

## 2022-08-15 ENCOUNTER — Inpatient Hospital Stay
Admission: RE | Admit: 2022-08-15 | Discharge: 2022-08-15 | Disposition: A | Discharge status: Home / Self Care | Payer: Self-pay | Source: Ambulatory Visit | Attending: Radiation Oncology | Admitting: Radiation Oncology

## 2022-08-15 ENCOUNTER — Telehealth: Payer: Self-pay | Admitting: Radiation Oncology

## 2022-08-15 ENCOUNTER — Other Ambulatory Visit: Payer: Self-pay | Admitting: Radiation Oncology

## 2022-08-15 DIAGNOSIS — G8194 Hemiplegia, unspecified affecting left nondominant side: Secondary | ICD-10-CM | POA: Diagnosis not present

## 2022-08-15 DIAGNOSIS — D496 Neoplasm of unspecified behavior of brain: Secondary | ICD-10-CM

## 2022-08-15 DIAGNOSIS — R270 Ataxia, unspecified: Secondary | ICD-10-CM | POA: Diagnosis not present

## 2022-08-15 DIAGNOSIS — K219 Gastro-esophageal reflux disease without esophagitis: Secondary | ICD-10-CM | POA: Diagnosis not present

## 2022-08-15 NOTE — Telephone Encounter (Signed)
4/29 @ 9:49 am sent via stat fax for recent brain images from [facility] to be push to [facility] -per Jalene Mullet.  F/U called to canopy spoke to [place].  She will transfer images to patient's timeline in epic.  Also requested path report. Waiting on path report.

## 2022-08-16 ENCOUNTER — Other Ambulatory Visit: Payer: Self-pay

## 2022-08-16 ENCOUNTER — Encounter: Payer: Self-pay | Admitting: Internal Medicine

## 2022-08-16 ENCOUNTER — Telehealth: Payer: Self-pay

## 2022-08-16 ENCOUNTER — Telehealth: Payer: Self-pay | Admitting: Pharmacy Technician

## 2022-08-16 ENCOUNTER — Other Ambulatory Visit (HOSPITAL_COMMUNITY): Payer: Self-pay

## 2022-08-16 ENCOUNTER — Inpatient Hospital Stay: Payer: Medicare HMO | Attending: Internal Medicine | Admitting: Internal Medicine

## 2022-08-16 ENCOUNTER — Telehealth: Payer: Self-pay | Admitting: Pharmacist

## 2022-08-16 VITALS — BP 156/75 | HR 85 | Temp 97.8°F | Resp 18 | Ht 65.0 in | Wt 121.5 lb

## 2022-08-16 DIAGNOSIS — C719 Malignant neoplasm of brain, unspecified: Secondary | ICD-10-CM

## 2022-08-16 DIAGNOSIS — H538 Other visual disturbances: Secondary | ICD-10-CM | POA: Diagnosis not present

## 2022-08-16 DIAGNOSIS — C714 Malignant neoplasm of occipital lobe: Secondary | ICD-10-CM

## 2022-08-16 DIAGNOSIS — Z8673 Personal history of transient ischemic attack (TIA), and cerebral infarction without residual deficits: Secondary | ICD-10-CM | POA: Diagnosis not present

## 2022-08-16 DIAGNOSIS — C713 Malignant neoplasm of parietal lobe: Secondary | ICD-10-CM | POA: Insufficient documentation

## 2022-08-16 DIAGNOSIS — Z7901 Long term (current) use of anticoagulants: Secondary | ICD-10-CM | POA: Insufficient documentation

## 2022-08-16 DIAGNOSIS — R531 Weakness: Secondary | ICD-10-CM

## 2022-08-16 MED ORDER — TEMOZOLOMIDE 20 MG PO CAPS
20.0000 mg | ORAL_CAPSULE | Freq: Every day | ORAL | 0 refills | Status: DC
Start: 2022-08-16 — End: 2022-08-17
  Filled 2022-08-16: qty 42, 42d supply, fill #0

## 2022-08-16 MED ORDER — LACOSAMIDE 50 MG PO TABS: 50.0000 mg | ORAL_TABLET | Freq: Two times a day (BID) | ORAL | 2 refills | Status: DC

## 2022-08-16 MED ORDER — ONDANSETRON HCL 8 MG PO TABS
8.0000 mg | ORAL_TABLET | Freq: Three times a day (TID) | ORAL | 1 refills | Status: DC | PRN
Start: 2022-08-16 — End: 2022-08-17
  Filled 2022-08-16: qty 30, 10d supply, fill #0

## 2022-08-16 MED ORDER — TEMOZOLOMIDE 100 MG PO CAPS
100.0000 mg | ORAL_CAPSULE | Freq: Every day | ORAL | 0 refills | Status: DC
Start: 2022-08-16 — End: 2022-08-17
  Filled 2022-08-16: qty 42, 42d supply, fill #0

## 2022-08-16 NOTE — Telephone Encounter (Signed)
Oral Oncology Patient Advocate Encounter  The patient has elected to use [facility] online pharmacy to fill their temozolomide. The discount pricing offered through this pharmacy will offer a cheaper price than billing insurance.   The estimated price of therapy for both strengths of temozolomide is around $480 (including shipping costs).  The patient's husband, Smitty Cords, would like to use his email for the account.   [email or web address]  I have sent instructions on how to set up an account to this email along with my contact information for any questions.  Jinger Neighbors, CPhT-Adv Oncology Pharmacy Patient Advocate [facility] Direct Number: [phone]  Fax: [phone]

## 2022-08-16 NOTE — Progress Notes (Signed)
Radiation Oncology         [phone] ________________________________  Initial Outpatient Consultation  Name: Donna Barber MRN: [id number]  Date: 08/17/2022  DOB: October 26, 1950  WU:[id number], Consuella Lose, MD  Lin Givens, MD   REFERRING PHYSICIAN: Lin Givens, MD  DIAGNOSIS:    ICD-10-CM   1. Glioblastoma multiforme of occipital lobe (HCC)  C71.4     2. 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 female who presented to her PCP this past March 2024 with c/o rhinorrhea and increasing headaches. Her PCP placed her on an antihistamine and nasal spray. A CT of the head was performed several weeks later on 07/07/22 at [facility] which showed a focal area of suspected parenchymal edema in the right parieto-occipital region measuring up to 4 cm, with mass effect on the adjacent right lateral ventricle.   MRI of the brain performed the following date (07/08/22) further demonstrated a large, 4.4 cm, centrally necrotic and heterogeneously enhancing mass centered in the posterior right temporal lobe, with involvement/extension to the adjacent right lateral ventricle/ependyma. Findings were noted a highly concerning for high-grade glioma. Multiple smaller sub-centimeter satellite lesions in the occipital lobe were also appreciated.   She was promptly referred for evaluation at the [facility] on 07/15/22. Based on MRI findings, she was advised to proceed with craniotomy for tumor resection. She was also started on keppra 500 mg BID, decadron 2 mg BID, and placed on pantoprazole for PUP.  She ultimately opted to proceed with tumor resection on 07/25/22. Pathology from the procedure revealed WHO grade 4 glioblastoma; IDH wildtype.   Post-op MRI of the brain on 07/25/22 showed a vague signal abnormality possibly  representing a subtle parenchymal enhancement along the superior margin of the resection cavity near the right parietal-occipital junction. A trace ependymal enhancement was also noted involving the atrium of the right lateral ventricle. I have personally reviewed her images  The patient was seen by [facility] medical oncology on 08/08/22. She was evaluated by Dr. Morrie Sheldon who recommends concurrent chemoradiation with daily oral temozolomide. Temozolomide will be taken daily for a total of 42 doses and will be started on the first day of radiation therapy. She was also seen in consultation by Dr. Barbaraann Cao yesterday who concurs with Dr. Nelly Laurence recommendation. The patient will continue to follow with Dr. Barbaraann Cao and return to him every 2 weeks for repeat labs and evaluation. She will also continue to follow with [facility] at their brain tumor clinic.   Dose of Decadron, if applicable: Completed decadron.  Recent neurologic symptoms, if any:  Seizures: No Headaches: She reports occasional twinges of pain, no real headaches.  Nausea: No Dizziness/ataxia: She reports occasional dizziness when standing, left side drift/lean. Difficulty with hand coordination: She reports decreased coordination to her left hand, due to recent stroke.  She reports she has noticed that she can hold/open lids on some things. Focal numbness/weakness: She reports left side weakness due to recent stroke.  She has some numbness to that left side, she states she is starting to get some sensation in her arms.  She continues to feel like there is something ("a pencil") stuck under her heel. Visual deficits/changes: She reports she has no left peripheral vision at all.  Denies visual changes in the right eye. Confusion/Memory  deficits: No   SAFETY ISSUES: Prior radiation? No Pacemaker/ICD? no Possible current pregnancy? yes Is the patient on methotrexate? no  Additional Complaints / other details: Patient had perioperative stroke, now with  left side deficits.  She has completed a 10 day stay with inpatient rehabilitation with good effect, she will start outpatient rehab soon.    PREVIOUS RADIATION THERAPY: No  PAST MEDICAL HISTORY:  has a past medical history of Abnormal Pap smear (2000), Canker sores oral, Depression, Dyspareunia, GERD (gastroesophageal reflux disease), History of chicken pox, History of measles, mumps, or rubella, Postmenopausal, Uterine fibroid, Vaginal atrophy, and Yeast infection.    PAST SURGICAL HISTORY: Past Surgical History:  Procedure Laterality Date   CARPAL TUNNEL RELEASE     DILATION AND CURETTAGE OF UTERUS     shoulder sugery     TUBAL LIGATION     bilateral    FAMILY HISTORY: family history includes Cancer in her brother; Diabetes in her mother; Heart disease in her mother; Hypertension in her mother.  SOCIAL HISTORY:  reports that she has never smoked. She has never used smokeless tobacco. She reports current alcohol use. She reports that she does not use drugs.  ALLERGIES: Codeine and Elemental sulfur  MEDICATIONS:  Current Outpatient Medications  Medication Sig Dispense Refill   acetaminophen (TYLENOL) 325 MG tablet Take 650 mg by mouth every 6 (six) hours as needed.     ALPRAZolam (XANAX) 0.25 MG tablet Take 0.25 mg by mouth at bedtime as needed for anxiety.     ciprofloxacin (CIPRO) 500 MG tablet Take 500 mg by mouth 2 (two) times daily.     Lacosamide (VIMPAT) 100 MG TABS Take 100 mg by mouth 2 (two) times daily.     lacosamide (VIMPAT) 50 MG TABS tablet Take 1 tablet (50 mg total) by mouth 2 (two) times daily. 60 tablet 2   mirtazapine (REMERON) 15 MG tablet Take 15 mg by mouth at bedtime.     pantoprazole (PROTONIX) 40 MG tablet Take 40 mg by mouth 2 (two) times daily.     polyethylene glycol (MIRALAX / GLYCOLAX) 17 g packet Take 17 g by mouth daily.     temozolomide (TEMODAR) 100 MG capsule Take 1 capsule (100 mg total) by mouth daily. (Take with 20 mg capsules for total daily  dose of 120 mg). May take on an empty stomach to decrease nausea & vomiting. 42 capsule 0   temozolomide (TEMODAR) 20 MG capsule Take 1 capsule (20 mg total) by mouth daily. (Take with 100 mg capsules for total daily dose of 120 mg).May take on an empty stomach to decrease nausea & vomiting. 42 capsule 0   ondansetron (ZOFRAN) 8 MG tablet Take 1 tablet (8 mg total) by mouth every 8 (eight) hours as needed for nausea or vomiting. May take 30-60 minutes prior to Temodar administration if nausea/vomiting occurs as needed. 30 tablet 1   No current facility-administered medications for this encounter.    REVIEW OF SYSTEMS:  Notable for that above.   PHYSICAL EXAM:  height is 5\' 5"  (1.651 m) and weight is 118 lb 2 oz (53.6 kg). Her temporal temperature is 97.1 F (36.2 C) (abnormal). Her blood pressure is 157/78 (abnormal) and her pulse is 92. Her respiration is 18 and oxygen saturation is 100%.   General: Alert and oriented, in no acute distress  HEENT: Head is normocephalic. Extraocular movements are intact. Oropharynx is clear. Heart: Regular in rate and rhythm with no murmurs, rubs, or gallops.  Chest: Clear to auscultation bilaterally, with no rhonchi, wheezes, or rales. Abdomen: Soft, nontender, nondistended, with no rigidity or guarding. Extremities: No cyanosis or edema. Lymphatics: see Neck Exam Skin: No concerning lesions. Musculoskeletal: left arm and left leg weakness (more profound in UE than LE) Neurologic: Left weakness as above. Left leg is numb, left arm with reduced sensation. Speech is fluent. Coordination per finger to nose testing is grossly intact. Psychiatric: Judgment and insight are intact. Affect is appropriate.  KPS = 70  100 - Normal; no complaints; no evidence of disease. 90   - Able to carry on normal activity; minor signs or symptoms of disease. 80   - Normal activity with effort; some signs or symptoms of disease. 9   - Cares for self; unable to carry on normal  activity or to do active work. 60   - Requires occasional assistance, but is able to care for most of his personal needs. 50   - Requires considerable assistance and frequent medical care. 40   - Disabled; requires special care and assistance. 30   - Severely disabled; hospital admission is indicated although death not imminent. 20   - Very sick; hospital admission necessary; active supportive treatment necessary. 10   - Moribund; fatal processes progressing rapidly. 0     - Dead  Karnofsky DA, Abelmann WH, Craver LS and Burchenal JH [phone]) The use of the nitrogen mustards in the palliative treatment of carcinoma: with particular reference to bronchogenic carcinoma Cancer 1 [phone]    LABORATORY DATA:  No results found for: "WBC", "HGB", "HCT", "MCV", "PLT" CMP  No results found for: "NA", "K", "CL", "CO2", "GLUCOSE", "BUN", "CREATININE", "CALCIUM", "PROT", "ALBUMIN", "AST", "ALT", "ALKPHOS", "BILITOT", "GFRNONAA", "GFRAA"       RADIOGRAPHY: as above    IMPRESSION/PLAN: Glioblastoma  Today, I talked to the patient and her supportive daughter about the findings and work-up thus far.  We discussed the patient's diagnosis of glioblastoma and general treatment for this, highlighting the role of radiotherapy in the management.  We discussed the available radiation techniques, and focused on the details of logistics and delivery.   I recommend 6 weeks of post operative partial brain IMRT to a total dose of 60Gy.  We discussed the risks, benefits, and side effects of radiotherapy. Side effects may include but not necessarily be limited to: hair loss, fatigue, skin irritation, nausea, HA, cognitive decline, nerve injury, brain injury, radionecrosis.  No guarantees of treatment were given. A consent form was signed and placed in the patient's medical record. The patient was encouraged to ask questions that I answered to the best of my ability.  She is enthusiastic to proceed.  CT simulation will  occur in 2 days, and treatment is slated to start on 5-13.  She is a devoted grandmother as has much to live for. I look forward to getting to know her and her family and participating in her care.    On date of service, in total, I spent 60 minutes on this encounter. Patient was seen in person. Note was signed after date of service - minutes pertain to date of service only.  __________________________________________   Lonie Peak, MD  This document serves as a record of services personally performed by Lonie Peak, MD. It was created on her behalf by Neena Rhymes, a trained medical scribe. The creation of this record is based on the scribe's personal observations and the provider's statements to them. This document has been checked and approved by  the attending provider.

## 2022-08-16 NOTE — Telephone Encounter (Signed)
Rn attempted to call pt obtain meaningful use and nurse evaluation information. Rn was unable to get pt and message was left. Rn will attempt to call back later.

## 2022-08-16 NOTE — Telephone Encounter (Signed)
Oral Oncology Patient Advocate Encounter  After completing a benefits investigation, prior authorization for Temozolomide is not required at this time through [facility].  Patient's copay is $1,128.17 (100mg ).   Patient's copay is $362.53 (20mg ).     Jinger Neighbors, CPhT-Adv Oncology Pharmacy Patient Advocate [facility] Direct Number: [phone]  Fax: [phone]

## 2022-08-16 NOTE — Telephone Encounter (Signed)
Ran attempted again to call to obtain meaningful use and nurse evaluation information without success. Second voicemail left for pt.

## 2022-08-16 NOTE — Progress Notes (Signed)
[facility] [address]  [place], [place] [address] [phone]   New Patient Evaluation  Date of Service: 08/16/22 Patient Name: Donna Barber Patient MRN: [id number] Patient DOB: Apr 24, 1950 Provider: Henreitta Leber, MD  Identifying Statement:  Donna Barber is a 72 y.o. female with right parietal glioblastoma who presents for initial consultation and evaluation.    Referring Provider: Maurice Small, MD [address] [place],  [place] [address]  Oncologic History: Oncology History  Glioblastoma, IDH-wildtype (HCC)  07/25/2022 Surgery   Craniotomy, right parietal resection with Dr. Shaune Pollack.  Path demonstrates glioblastoma, IDH wild type.     Biomarkers:  MGMT Unknown.  IDH 1/2 Wild type.  EGFR Unknown  TERT Unknown   History of Present Illness: The patient's records from the referring physician were obtained and reviewed and the patient interviewed to confirm this HPI.  Donna Barber presents to review recent brain tumor diagnosis.  She presented to medical attention in March 2024 with several days history of new onset severe headache.  CNS imaging demonstrated large enhancing mass in right thalamus and occipital lobe.  She underwent craniotomy, resection with Dr. Shaune Pollack on 07/25/22 at [facility]; path demonstrated glioblastoma IDH wild type.  Following surgery, her left arm and leg were weak, sche completed two weeks of rehab and regained most of her prior function.  Right now has impaired vision on the left, and some weakness of the left arm.  Otherwise functionally independent.  Medications: Current Outpatient Medications on File Prior to Visit  Medication Sig Dispense Refill   buPROPion (WELLBUTRIN XL) 300 MG 24 hr tablet Take 300 mg by mouth daily.     No current facility-administered medications on file prior to visit.    Allergies:  Allergies  Allergen Reactions   Codeine Nausea And Vomiting   Elemental Sulfur     Past Medical History:  Past Medical History:  Diagnosis Date   Abnormal Pap smear 2000   Cin-3 treated with LEEP   Canker sores oral    Depression    Dyspareunia    secondary to  atrophy   GERD (gastroesophageal reflux disease)    History of chicken pox    History of measles, mumps, or rubella    Postmenopausal    Uterine fibroid    Vaginal atrophy    Yeast infection    Past Surgical History:  Past Surgical History:  Procedure Laterality Date   CARPAL TUNNEL RELEASE     DILATION AND CURETTAGE OF UTERUS     shoulder sugery     TUBAL LIGATION     bilateral   Social History:  Social History   Socioeconomic History   Marital status: Married    Spouse name: Not on file   Number of children: Not on file   Years of education: Not on file   Highest education level: Not on file  Occupational History   Not on file  Tobacco Use   Smoking status: Never   Smokeless tobacco: Never  Vaping Use   Vaping Use: Never used  Substance and Sexual Activity   Alcohol use: Yes   Drug use: No   Sexual activity: Yes    Birth control/protection: Surgical  Other Topics Concern   Not on file  Social History Narrative   Not on file   Social Determinants of Health   Financial Resource Strain: Not on file  Food Insecurity: Not on file  Transportation Needs:  Not on file  Physical Activity: Not on file  Stress: Not on file  Social Connections: Not on file  Intimate Partner Violence: Not on file   Family History:  Family History  Problem Relation Age of Onset   Hypertension Mother    Diabetes Mother    Heart disease Mother    Cancer Brother     Review of Systems: Constitutional: Doesn't report fevers, chills or abnormal weight loss Eyes: Doesn't report blurriness of vision Ears, nose, mouth, throat, and face: Doesn't report sore throat Respiratory: Doesn't report cough, dyspnea or wheezes Cardiovascular: Doesn't report palpitation, chest discomfort  Gastrointestinal:   Doesn't report nausea, constipation, diarrhea GU: Doesn't report incontinence Skin: Doesn't report skin rashes Neurological: Per HPI Musculoskeletal: Doesn't report joint pain Behavioral/Psych: Doesn't report anxiety  Physical Exam: Vitals:   08/16/22 1208  BP: (!) 156/75  Pulse: 85  Resp: 18  Temp: 97.8 F (36.6 C)  SpO2: 100%   KPS: 70. General: Alert, cooperative, pleasant, in no acute distress Head: Normal EENT: No conjunctival injection or scleral icterus.  Lungs: Resp effort normal Cardiac: Regular rate Abdomen: Non-distended abdomen Skin: No rashes cyanosis or petechiae. Extremities: No clubbing or edema  Neurologic Exam: Mental Status: Awake, alert, attentive to examiner. Oriented to self and environment. Language is fluent with intact comprehension.  Cranial Nerves: Visual acuity is grossly normal. Left hemianopia noted. Extra-ocular movements intact. No ptosis. Face is symmetric Motor: Tone and bulk are normal. Power is 4/5 in left arm, 4+/5 in left leg. Reflexes are symmetric, no pathologic reflexes present.  Sensory: Intact to light touch Gait: Hemiparetic   Labs: I have reviewed the data as listed No results found for: "NA", "K", "CL", "CO2", "GLUCOSE", "BUN", "CREATININE", "CALCIUM", "PROT", "ALBUMIN", "AST", "ALT", "ALKPHOS", "BILITOT", "GFRNONAA", "GFRAA" No results found for: "WBC", "NEUTROABS", "HGB", "HCT", "MCV", "PLT"  Imaging: MRI BRAIN WITHOUT AND WITH CONTRAST   INDICATION: s/p crani for tumor, D49.6 Neoplasm of unspecified behavior of  brain (CMS/HHS-HCC)   ADDITIONAL CLINICAL HISTORY: Right occipital lobe mass resection prior day.   COMPARISON: Outside preoperative MRI 07/08/2022. Intraoperative MRI brain  same day.   TECHNIQUE/PROTOCOL: Adult brain tumor protocol without and with IV  contrast.   FINDINGS:  Postoperative changes of right occipital craniotomy with subjacent  resection cavity. Multifocal postoperative pneumocephalus and   pneumoventricle.   Circumferential diffusion restriction abnormality along the resection  cavity with additional more focal areas of diffusion restriction in the  right posterior thalamus, right hippocampus, and right medial occipital  lobe compatible with injury/infarct . Perilesional and right temporal horn  periventricular T2 FLAIR signal abnormality is slightly decreased compared  to the preoperative MRI.   Linear vascular enhancement is present at the right lateral resection  margin extending to the atrium and minimal linear enhancement around the  resection cavity. There is trace thickening of the right tentorial leaflet.  Trace linear FLAIR signal along the left posterior superior frontal lobe is  likely trace subarachnoid hemorrhage and small subdural collection along  the right posterior fossa.   Other Brain Parenchyma:  No hemorrhage or acute cortical infarction.  No  midline shift.  Ventricles: No hydrocephalus.  Extra-Axial Spaces: No extra-axial fluid collection.  Basal Cisterns: The basal cisterns are maintained.  Intracranial Flow-Voids: Normal.   Paranasal Sinuses: Normal.  Mastoid air cells: Normal.  Orbits: Normal.   IMPRESSION:   1.  Interval gross total resection of right temporal enhancing mass.   2.  New focal areas of diffusion restriction  in the right thalamus and  right medial occipital lobe compatible with areas of injury/infarct.   Electronically Reviewed by:  Reuel Derby, MD, [facility]  Electronically Reviewed on:  07/26/2022 10:11 AM    Pathology: Component 3 wk ago  Case Report Surgical Pathology                                Case: [id number]                               Authorizing Provider:  Lin Givens, MD         Collected:           07/25/2022 1123             Ordering Location:     [facility]    Received:            07/25/2022 1130                                    Periop                                                                      Pathologist:           Ephraim Hamburger, MD                                                   Intraop:               Ephraim Hamburger, MD                                                   Specimens:   A) - Brain, right occipital tumor                                                                  B) - Tissue, Right Occipital Tumor                                                                C) - Brain, right occipital tumor  DIAGNOSIS   A-C. Right occipital tumor, resection:   Integrated diagnosis: Glioblastoma, IDH-wildtype, CNS WHO grade 4. Histopathological classification: Glioblastoma. CNS WHO grade: 4 Molecular information: IDH1 R132H is negative (immunohistochemistry). Next-generation sequencing and chromosomal microarray molecular tests are in process.     Assessment/Plan Glioblastoma, IDH-wildtype (HCC)  Donna Barber presents with clinical and radiographic syndrome c/w right thalamic/occipital glioblastoma, IDH wild type.  She has recovered well following her craniotomy, with left visual field impairment and mild left sided weakness.  We had an extensive conversation with her and her family regarding pathology, prognosis, and available treatment pathways.  Age will be a limiting facor, but we are encouraged by good recovery of motor function during post-operative rehabilitation.   We ultimately recommended proceeding with course of intensity modulated radiation therapy and concurrent daily Temozolomide.  Radiation will be administered Mon-Fri over 6 weeks, Temodar will be dosed at 75mg /m2 to be given daily over 42 days.  We reviewed side effects of temodar, including fatigue, nausea/vomiting, constipation, and cytopenias.  Informed consent was verbally obtained at bedside to proceed with oral chemotherapy.  Chemotherapy should be held for the following:  ANC less than 1,000  Platelets  less than 100,000  LFT or creatinine greater than 2x ULN  If clinical concerns/contraindications develop  Due to lack of seizures and poor tolerance of vimpat, will decrease dose to 50mg  BID.  Anticoagulation was started following her stroke, but because this event was intra-operative in nature rather than thromboembolic we would recommend stopping Xarelto.  There is no sign of DVT clinically.    Recommended stopping Wellbutrin which was started during rehab.  This can lower seizure threshold.  She will meet with radiation oncology team tomorrow.  Every 2 weeks during radiation, labs will be checked accompanied by a clinical evaluation in the brain tumor clinic.  We appreciate the opportunity to participate in the care of Donna Barber.   Screening for potential clinical trials was performed and discussed using eligibility criteria for active protocols at [facility], loco-regional tertiary centers, as well as national database available on GroundTransfer.at.    The patient is not a candidate for a research protocol at this time due to no suitable study identified.   We spent twenty additional minutes teaching regarding the natural history, biology, and historical experience in the treatment of brain tumors. We then discussed in detail the current recommendations for therapy focusing on the mode of administration, mechanism of action, anticipated toxicities, and quality of life issues associated with this plan. We also provided teaching sheets for the patient to take home as an additional resource.  All questions were answered. The patient knows to call the clinic with any problems, questions or concerns. No barriers to learning were detected.  The total time spent in the encounter was 60 minutes and more than 50% was on counseling and review of test results   Henreitta Leber, MD Medical Director of Neuro-Oncology [facility] [place] [facility] 08/16/22 12:18 PM

## 2022-08-16 NOTE — Progress Notes (Signed)

## 2022-08-16 NOTE — Telephone Encounter (Signed)
Oral Oncology Pharmacist Encounter  Received new prescription for Temodar (temozolomide) for the treatment of glioblastoma in conjunction with radiation, planned duration 42 days.  CMP from 08/08/22 and CBC w/ Diff from 08/13/22 assessed, no relevant lab abnormalities requiring baseline dose adjustment required at this time. Prescription dose and frequency assessed for appropriateness.  Current medication list in Epic reviewed, no relevant/significant DDIs with Temodar identified.  Evaluated chart and no patient barriers to medication adherence noted.   Patient agreement for treatment documented in MD note on 08/16/22.  Due to high copay, patient's family has elected to fill temozolomide though [facility] to obtain temozolomide at reduced price.   Oral Oncology Clinic will continue to follow for initial counseling and start date.  Lenord Carbo, PharmD, BCPS, [facility] Hematology/Oncology Clinical Pharmacist [facility] Oral Chemotherapy Navigation Clinics [phone] 08/16/2022 2:49 PM

## 2022-08-17 ENCOUNTER — Ambulatory Visit
Admission: RE | Admit: 2022-08-17 | Discharge: 2022-08-17 | Disposition: A | Discharge status: Home / Self Care | Payer: Medicare HMO | Source: Ambulatory Visit | Attending: Radiation Oncology | Admitting: Radiation Oncology

## 2022-08-17 ENCOUNTER — Other Ambulatory Visit (HOSPITAL_COMMUNITY): Payer: Self-pay

## 2022-08-17 VITALS — BP 157/78 | HR 92 | Temp 97.1°F | Resp 18 | Ht 65.0 in | Wt 118.1 lb

## 2022-08-17 DIAGNOSIS — Z79630 Long term (current) use of alkylating agent: Secondary | ICD-10-CM | POA: Insufficient documentation

## 2022-08-17 DIAGNOSIS — R42 Dizziness and giddiness: Secondary | ICD-10-CM | POA: Insufficient documentation

## 2022-08-17 DIAGNOSIS — K219 Gastro-esophageal reflux disease without esophagitis: Secondary | ICD-10-CM | POA: Diagnosis not present

## 2022-08-17 DIAGNOSIS — Z923 Personal history of irradiation: Secondary | ICD-10-CM | POA: Insufficient documentation

## 2022-08-17 DIAGNOSIS — C714 Malignant neoplasm of occipital lobe: Secondary | ICD-10-CM | POA: Insufficient documentation

## 2022-08-17 DIAGNOSIS — R270 Ataxia, unspecified: Secondary | ICD-10-CM | POA: Insufficient documentation

## 2022-08-17 DIAGNOSIS — Z809 Family history of malignant neoplasm, unspecified: Secondary | ICD-10-CM | POA: Insufficient documentation

## 2022-08-17 DIAGNOSIS — Z79899 Other long term (current) drug therapy: Secondary | ICD-10-CM | POA: Diagnosis not present

## 2022-08-17 DIAGNOSIS — C719 Malignant neoplasm of brain, unspecified: Secondary | ICD-10-CM

## 2022-08-17 MED ORDER — TEMOZOLOMIDE 20 MG PO CAPS
20.0000 mg | ORAL_CAPSULE | Freq: Every day | ORAL | 0 refills | Status: DC
Start: 2022-08-17 — End: 2022-12-20

## 2022-08-17 MED ORDER — TEMOZOLOMIDE 100 MG PO CAPS
100.0000 mg | ORAL_CAPSULE | Freq: Every day | ORAL | 0 refills | Status: DC
Start: 2022-08-17 — End: 2022-12-20

## 2022-08-17 MED ORDER — ONDANSETRON HCL 8 MG PO TABS
8.0000 mg | ORAL_TABLET | Freq: Three times a day (TID) | ORAL | 1 refills | Status: DC | PRN
Start: 2022-08-17 — End: 2022-10-03

## 2022-08-18 ENCOUNTER — Other Ambulatory Visit: Payer: Self-pay

## 2022-08-18 DIAGNOSIS — R2689 Other abnormalities of gait and mobility: Secondary | ICD-10-CM | POA: Diagnosis not present

## 2022-08-18 DIAGNOSIS — R270 Ataxia, unspecified: Secondary | ICD-10-CM | POA: Diagnosis not present

## 2022-08-19 ENCOUNTER — Ambulatory Visit
Admission: RE | Admit: 2022-08-19 | Discharge: 2022-08-19 | Disposition: A | Discharge status: Home / Self Care | Payer: Medicare HMO | Source: Ambulatory Visit | Attending: Radiation Oncology | Admitting: Radiation Oncology

## 2022-08-19 ENCOUNTER — Other Ambulatory Visit: Payer: Self-pay

## 2022-08-19 DIAGNOSIS — Z510 Encounter for antineoplastic radiation therapy: Secondary | ICD-10-CM | POA: Diagnosis not present

## 2022-08-19 DIAGNOSIS — C714 Malignant neoplasm of occipital lobe: Secondary | ICD-10-CM | POA: Diagnosis not present

## 2022-08-20 ENCOUNTER — Encounter: Payer: Self-pay | Admitting: Radiation Oncology

## 2022-08-20 DIAGNOSIS — C714 Malignant neoplasm of occipital lobe: Secondary | ICD-10-CM | POA: Insufficient documentation

## 2022-08-22 DIAGNOSIS — C714 Malignant neoplasm of occipital lobe: Secondary | ICD-10-CM | POA: Diagnosis not present

## 2022-08-22 NOTE — Telephone Encounter (Signed)
Oral Chemotherapy Pharmacist Encounter  I spoke with patient's husband for overview of: Temodar for the treatment of glioblastoma multiforme in conjunction with radiation, planned duration concomitant phase 42 days of therapy.   Counseled patient's husband on administration, dosing, side effects, monitoring, drug-food interactions, safe handling, storage, and disposal.  Patient will take Temodar 100mg  capsules and Temodar 20mg  capsules, 120 mg total daily dose, by mouth once daily, may take at bedtime and on an empty stomach to decrease nausea and vomiting.  Patient will take Temodar concurrent with radiation for 42 days straight.  Temodar start date: 08/28/22 PM Radiation start date: 08/29/22  Adverse effects include but are not limited to: nausea, vomiting, anorexia, GI upset, rash, drug fever, and fatigue.  Prophylactic Zofran will not be used at initiation of concurrent phase, but will be initiated if nausea develops despite Temodar administration on an empty stomach and at bedtime. If this occurs, patient will take Zofran 8 mg tablet, 1 tablet by mouth 30-60 min prior to Temodar dose to help decrease N/V   Reviewed importance of keeping a medication schedule and plan for any missed doses. No barriers to medication adherence identified.  Medication reconciliation performed and medication/allergy list updated.  Insurance authorization for Temodar has been obtained. Due to high copay with insurance, patient is filling temozolomide through [employer]. This is anticipated to arrive to patient's home this week per Mr. Pasko.   All questions answered.  Mr. Gribben voiced understanding and appreciation.   Medication education handout placed in mail for patient and patient's family. Patient's family knows to call the office with questions or concerns. Oral Chemotherapy Clinic phone number provided.   Lenord Carbo, PharmD, BCPS, BCOP Hematology/Oncology  Clinical Pharmacist [facility] Oral Chemotherapy Navigation Clinics [phone] 08/22/2022 11:18 AM

## 2022-08-23 ENCOUNTER — Other Ambulatory Visit: Payer: Self-pay | Admitting: Internal Medicine

## 2022-08-23 ENCOUNTER — Telehealth: Payer: Self-pay | Admitting: Internal Medicine

## 2022-08-23 DIAGNOSIS — C719 Malignant neoplasm of brain, unspecified: Secondary | ICD-10-CM

## 2022-08-23 NOTE — Telephone Encounter (Signed)
Called patient regarding May and June appointments, left a voicemail.

## 2022-08-24 DIAGNOSIS — Z510 Encounter for antineoplastic radiation therapy: Secondary | ICD-10-CM | POA: Diagnosis not present

## 2022-08-24 DIAGNOSIS — R2689 Other abnormalities of gait and mobility: Secondary | ICD-10-CM | POA: Diagnosis not present

## 2022-08-24 DIAGNOSIS — C714 Malignant neoplasm of occipital lobe: Secondary | ICD-10-CM | POA: Diagnosis not present

## 2022-08-24 DIAGNOSIS — R270 Ataxia, unspecified: Secondary | ICD-10-CM | POA: Diagnosis not present

## 2022-08-29 ENCOUNTER — Ambulatory Visit
Admission: RE | Admit: 2022-08-29 | Discharge: 2022-08-29 | Disposition: A | Discharge status: Home / Self Care | Payer: Medicare HMO | Source: Ambulatory Visit | Attending: Radiation Oncology | Admitting: Radiation Oncology

## 2022-08-29 ENCOUNTER — Other Ambulatory Visit: Payer: Self-pay

## 2022-08-29 DIAGNOSIS — C714 Malignant neoplasm of occipital lobe: Secondary | ICD-10-CM

## 2022-08-29 DIAGNOSIS — C719 Malignant neoplasm of brain, unspecified: Secondary | ICD-10-CM

## 2022-08-29 DIAGNOSIS — Z510 Encounter for antineoplastic radiation therapy: Secondary | ICD-10-CM | POA: Diagnosis not present

## 2022-08-29 LAB — RAD ONC ARIA SESSION SUMMARY
Course Elapsed Days: 0
Plan Fractions Treated to Date: 1
Plan Prescribed Dose Per Fraction: 2 Gy
Plan Total Fractions Prescribed: 23
Plan Total Prescribed Dose: 46 Gy
Reference Point Dosage Given to Date: 2 Gy
Reference Point Session Dosage Given: 2 Gy
Session Number: 1

## 2022-08-29 MED ORDER — SONAFINE EX EMUL
1.0000 | Freq: Two times a day (BID) | CUTANEOUS | Status: DC
  Administered 2022-08-29: 1 via TOPICAL

## 2022-08-29 NOTE — Progress Notes (Signed)
Pt here for patient teaching. Pt given Radiation and You booklet, Managing Acute Radiation Side Effects for Head and Neck Cancer handout, skin care instructions, and Sonafine.  Reviewed areas of pertinence such as fatigue, hair loss, mouth changes, nausea and vomiting, skin changes, throat changes, headache, blurry vision, earaches, and taste changes. Pt able to give teach back of to pat skin, use unscented/gentle soap, and drink plenty of water, apply Sonafine bid, avoid applying anything to skin within 4 hours of treatment, and to use an electric razor if they must shave. Pt verbalizes understanding of information given and will contact nursing with any questions or concerns.    Http://rtanswers.org/treatmentinformation/whattoexpect/index  This concludes the interview.   Ruel Favors, LPN

## 2022-08-30 ENCOUNTER — Other Ambulatory Visit: Payer: Self-pay

## 2022-08-30 ENCOUNTER — Ambulatory Visit
Admission: RE | Admit: 2022-08-30 | Discharge: 2022-08-30 | Disposition: A | Discharge status: Home / Self Care | Payer: Medicare HMO | Source: Ambulatory Visit | Attending: Radiation Oncology | Admitting: Radiation Oncology

## 2022-08-30 DIAGNOSIS — C714 Malignant neoplasm of occipital lobe: Secondary | ICD-10-CM | POA: Diagnosis not present

## 2022-08-30 DIAGNOSIS — Z510 Encounter for antineoplastic radiation therapy: Secondary | ICD-10-CM | POA: Diagnosis not present

## 2022-08-30 LAB — RAD ONC ARIA SESSION SUMMARY
Course Elapsed Days: 1
Plan Fractions Treated to Date: 2
Plan Prescribed Dose Per Fraction: 2 Gy
Plan Total Fractions Prescribed: 23
Plan Total Prescribed Dose: 46 Gy
Reference Point Dosage Given to Date: 4 Gy
Reference Point Session Dosage Given: 2 Gy
Session Number: 2

## 2022-08-31 ENCOUNTER — Other Ambulatory Visit: Payer: Self-pay

## 2022-08-31 ENCOUNTER — Ambulatory Visit
Admission: RE | Admit: 2022-08-31 | Discharge: 2022-08-31 | Disposition: A | Discharge status: Home / Self Care | Payer: Medicare HMO | Source: Ambulatory Visit | Attending: Radiation Oncology | Admitting: Radiation Oncology

## 2022-08-31 DIAGNOSIS — C714 Malignant neoplasm of occipital lobe: Secondary | ICD-10-CM | POA: Diagnosis not present

## 2022-08-31 DIAGNOSIS — Z510 Encounter for antineoplastic radiation therapy: Secondary | ICD-10-CM | POA: Diagnosis not present

## 2022-08-31 LAB — RAD ONC ARIA SESSION SUMMARY
Course Elapsed Days: 2
Plan Fractions Treated to Date: 3
Plan Prescribed Dose Per Fraction: 2 Gy
Plan Total Fractions Prescribed: 23
Plan Total Prescribed Dose: 46 Gy
Reference Point Dosage Given to Date: 6 Gy
Reference Point Session Dosage Given: 2 Gy
Session Number: 3

## 2022-09-01 ENCOUNTER — Ambulatory Visit
Admission: RE | Admit: 2022-09-01 | Discharge: 2022-09-01 | Disposition: A | Discharge status: Home / Self Care | Payer: Medicare HMO | Source: Ambulatory Visit | Attending: Radiation Oncology | Admitting: Radiation Oncology

## 2022-09-01 ENCOUNTER — Other Ambulatory Visit: Payer: Self-pay

## 2022-09-01 DIAGNOSIS — Z510 Encounter for antineoplastic radiation therapy: Secondary | ICD-10-CM | POA: Diagnosis not present

## 2022-09-01 DIAGNOSIS — C714 Malignant neoplasm of occipital lobe: Secondary | ICD-10-CM | POA: Diagnosis not present

## 2022-09-01 LAB — RAD ONC ARIA SESSION SUMMARY
Course Elapsed Days: 3
Plan Fractions Treated to Date: 4
Plan Prescribed Dose Per Fraction: 2 Gy
Plan Total Fractions Prescribed: 23
Plan Total Prescribed Dose: 46 Gy
Reference Point Dosage Given to Date: 8 Gy
Reference Point Session Dosage Given: 2 Gy
Session Number: 4

## 2022-09-02 ENCOUNTER — Telehealth: Payer: Self-pay | Admitting: *Deleted

## 2022-09-02 ENCOUNTER — Ambulatory Visit
Admission: RE | Admit: 2022-09-02 | Discharge: 2022-09-02 | Disposition: A | Discharge status: Home / Self Care | Payer: Medicare HMO | Source: Ambulatory Visit | Attending: Radiation Oncology | Admitting: Radiation Oncology

## 2022-09-02 ENCOUNTER — Other Ambulatory Visit: Payer: Self-pay

## 2022-09-02 DIAGNOSIS — C714 Malignant neoplasm of occipital lobe: Secondary | ICD-10-CM | POA: Diagnosis not present

## 2022-09-02 DIAGNOSIS — Z510 Encounter for antineoplastic radiation therapy: Secondary | ICD-10-CM | POA: Diagnosis not present

## 2022-09-02 LAB — RAD ONC ARIA SESSION SUMMARY
Course Elapsed Days: 4
Plan Fractions Treated to Date: 5
Plan Prescribed Dose Per Fraction: 2 Gy
Plan Total Fractions Prescribed: 23
Plan Total Prescribed Dose: 46 Gy
Reference Point Dosage Given to Date: 10 Gy
Reference Point Session Dosage Given: 2 Gy
Session Number: 5

## 2022-09-02 NOTE — Telephone Encounter (Signed)
Daughter called on patient's behalf and LVM: Started oral chemo pill this week and taking oral nausea med an hour before taking it, but pt experiencing continuous nausea. Not vomiting. They looked on line for non medication things to try, but are now wondering if she needs a different medication.  Called patient. Patient states she is using ginger chews with some relief. She is able to eat, "although nothing tastes good". She reports only taking Zofran once a day - 1 hour before Temodar. Advised her she can take Zofran every 8 hours if needed, so up to 3 times a day for nausea. She said she was just reading that on the label while we were talking and wonders if that would have helped this week. She said she will take Zofran as needed every 8 hours and see if the nausea decreases. She will contact office if nausea continues while taking Zofran.   Advised her that Zofran can cause constipation and to be sure to take Miralax (on her med list) to prevent. She states she is often constipated and has been trying to manage with fluids, fruits and vegetables as she did not want to become dependant on laxatives. Explained that Miralax not stimulant laxative, can be taken daily, and would be helpful as nausea is worse with constipation.   Patient verbalized understanding of all information.  Message forwarded to Dr. Barbaraann Cao as Lorain Childes

## 2022-09-05 ENCOUNTER — Ambulatory Visit
Admission: RE | Admit: 2022-09-05 | Discharge: 2022-09-05 | Disposition: A | Discharge status: Home / Self Care | Payer: Medicare HMO | Source: Ambulatory Visit | Attending: Radiation Oncology | Admitting: Radiation Oncology

## 2022-09-05 ENCOUNTER — Other Ambulatory Visit: Payer: Self-pay

## 2022-09-05 ENCOUNTER — Other Ambulatory Visit: Payer: Self-pay | Admitting: Radiation Oncology

## 2022-09-05 DIAGNOSIS — C714 Malignant neoplasm of occipital lobe: Secondary | ICD-10-CM

## 2022-09-05 DIAGNOSIS — Z510 Encounter for antineoplastic radiation therapy: Secondary | ICD-10-CM | POA: Diagnosis not present

## 2022-09-05 LAB — RAD ONC ARIA SESSION SUMMARY
Course Elapsed Days: 7
Plan Fractions Treated to Date: 6
Plan Prescribed Dose Per Fraction: 2 Gy
Plan Total Fractions Prescribed: 23
Plan Total Prescribed Dose: 46 Gy
Reference Point Dosage Given to Date: 12 Gy
Reference Point Session Dosage Given: 2 Gy
Session Number: 6

## 2022-09-05 MED ORDER — ONDANSETRON 8 MG PO TBDP
8.0000 mg | ORAL_TABLET | Freq: Three times a day (TID) | ORAL | 3 refills | Status: DC | PRN
Start: 2022-09-05 — End: 2022-10-03

## 2022-09-06 ENCOUNTER — Other Ambulatory Visit: Payer: Self-pay

## 2022-09-06 ENCOUNTER — Ambulatory Visit
Admission: RE | Admit: 2022-09-06 | Discharge: 2022-09-06 | Disposition: A | Discharge status: Home / Self Care | Payer: Medicare HMO | Source: Ambulatory Visit | Attending: Radiation Oncology | Admitting: Radiation Oncology

## 2022-09-06 ENCOUNTER — Encounter: Payer: Self-pay | Admitting: Internal Medicine

## 2022-09-06 DIAGNOSIS — C714 Malignant neoplasm of occipital lobe: Secondary | ICD-10-CM | POA: Diagnosis not present

## 2022-09-06 DIAGNOSIS — Z510 Encounter for antineoplastic radiation therapy: Secondary | ICD-10-CM | POA: Diagnosis not present

## 2022-09-06 LAB — RAD ONC ARIA SESSION SUMMARY
Course Elapsed Days: 8
Plan Fractions Treated to Date: 7
Plan Prescribed Dose Per Fraction: 2 Gy
Plan Total Fractions Prescribed: 23
Plan Total Prescribed Dose: 46 Gy
Reference Point Dosage Given to Date: 14 Gy
Reference Point Session Dosage Given: 2 Gy
Session Number: 7

## 2022-09-07 ENCOUNTER — Ambulatory Visit
Admission: RE | Admit: 2022-09-07 | Discharge: 2022-09-07 | Disposition: A | Discharge status: Home / Self Care | Payer: Medicare HMO | Source: Ambulatory Visit | Attending: Radiation Oncology | Admitting: Radiation Oncology

## 2022-09-07 ENCOUNTER — Other Ambulatory Visit: Payer: Self-pay

## 2022-09-07 ENCOUNTER — Encounter: Payer: Self-pay | Admitting: *Deleted

## 2022-09-07 ENCOUNTER — Telehealth: Payer: Self-pay | Admitting: *Deleted

## 2022-09-07 DIAGNOSIS — C714 Malignant neoplasm of occipital lobe: Secondary | ICD-10-CM | POA: Diagnosis not present

## 2022-09-07 DIAGNOSIS — Z510 Encounter for antineoplastic radiation therapy: Secondary | ICD-10-CM | POA: Diagnosis not present

## 2022-09-07 LAB — RAD ONC ARIA SESSION SUMMARY
Course Elapsed Days: 9
Plan Fractions Treated to Date: 8
Plan Prescribed Dose Per Fraction: 2 Gy
Plan Total Fractions Prescribed: 23
Plan Total Prescribed Dose: 46 Gy
Reference Point Dosage Given to Date: 16 Gy
Reference Point Session Dosage Given: 2 Gy
Session Number: 8

## 2022-09-07 MED ORDER — PROCHLORPERAZINE MALEATE 10 MG PO TABS: 10.0000 mg | ORAL_TABLET | Freq: Four times a day (QID) | ORAL | 0 refills | Status: DC | PRN

## 2022-09-07 NOTE — Telephone Encounter (Signed)
Daughter left a message stating Donna Barber has been taking the zofran as directed for nausea. She has been trying to eat small meals and trying other things to decrease nausea, but she is still nauseated in the mornings. They would like her to try something else for nausea.

## 2022-09-07 NOTE — Addendum Note (Signed)
Addended by: Henreitta Leber on: 09/07/2022 01:48 PM   Modules accepted: Orders

## 2022-09-08 ENCOUNTER — Ambulatory Visit
Admission: RE | Admit: 2022-09-08 | Discharge: 2022-09-08 | Disposition: A | Discharge status: Home / Self Care | Payer: Medicare HMO | Source: Ambulatory Visit | Attending: Radiation Oncology | Admitting: Radiation Oncology

## 2022-09-08 ENCOUNTER — Other Ambulatory Visit: Payer: Self-pay

## 2022-09-08 ENCOUNTER — Inpatient Hospital Stay: Payer: Medicare HMO | Attending: Internal Medicine

## 2022-09-08 ENCOUNTER — Inpatient Hospital Stay (HOSPITAL_BASED_OUTPATIENT_CLINIC_OR_DEPARTMENT_OTHER): Payer: Medicare HMO | Admitting: Internal Medicine

## 2022-09-08 VITALS — BP 145/73 | HR 87 | Temp 97.5°F | Resp 17 | Ht 65.0 in | Wt 120.8 lb

## 2022-09-08 DIAGNOSIS — C719 Malignant neoplasm of brain, unspecified: Secondary | ICD-10-CM

## 2022-09-08 DIAGNOSIS — C713 Malignant neoplasm of parietal lobe: Secondary | ICD-10-CM | POA: Insufficient documentation

## 2022-09-08 DIAGNOSIS — C714 Malignant neoplasm of occipital lobe: Secondary | ICD-10-CM | POA: Diagnosis not present

## 2022-09-08 DIAGNOSIS — Z510 Encounter for antineoplastic radiation therapy: Secondary | ICD-10-CM | POA: Diagnosis not present

## 2022-09-08 LAB — CMP (CANCER CENTER ONLY)
ALT: 17 U/L (ref 0–44)
AST: 19 U/L (ref 15–41)
Albumin: 4.3 g/dL (ref 3.5–5.0)
Alkaline Phosphatase: 88 U/L (ref 38–126)
Anion gap: 6 (ref 5–15)
BUN: 11 mg/dL (ref 8–23)
CO2: 29 mmol/L (ref 22–32)
Calcium: 9.3 mg/dL (ref 8.9–10.3)
Chloride: 104 mmol/L (ref 98–111)
Creatinine: 0.72 mg/dL (ref 0.44–1.00)
GFR, Estimated: >60 mL/min (ref >60)
Glucose, Bld: 104 mg/dL — ABNORMAL HIGH (ref 70–99)
Potassium: 4.4 mmol/L (ref 3.5–5.1)
Sodium: 139 mmol/L (ref 135–145)
Total Bilirubin: 0.5 mg/dL (ref 0.3–1.2)
Total Protein: 6.6 g/dL (ref 6.5–8.1)

## 2022-09-08 LAB — CBC WITH DIFFERENTIAL (CANCER CENTER ONLY)
Abs Immature Granulocytes: 0.01 10*3/uL (ref 0.00–0.07)
Basophils Absolute: 0.1 10*3/uL (ref 0.0–0.1)
Basophils Relative: 1 %
Eosinophils Absolute: 0.2 10*3/uL (ref 0.0–0.5)
Eosinophils Relative: 3 %
HCT: 39.9 % (ref 36.0–46.0)
Hemoglobin: 13.1 g/dL (ref 12.0–15.0)
Immature Granulocytes: 0 %
Lymphocytes Relative: 18 %
Lymphs Abs: 1.2 10*3/uL (ref 0.7–4.0)
MCH: 27.6 pg (ref 26.0–34.0)
MCHC: 32.8 g/dL (ref 30.0–36.0)
MCV: 84.2 fL (ref 80.0–100.0)
Monocytes Absolute: 0.7 10*3/uL (ref 0.1–1.0)
Monocytes Relative: 10 %
Neutro Abs: 4.5 10*3/uL (ref 1.7–7.7)
Neutrophils Relative %: 68 %
Platelet Count: 261 10*3/uL (ref 150–400)
RBC: 4.74 MIL/uL (ref 3.87–5.11)
RDW: 13.7 % (ref 11.5–15.5)
WBC Count: 6.8 10*3/uL (ref 4.0–10.5)
nRBC: 0 % (ref 0.0–0.2)

## 2022-09-08 LAB — RAD ONC ARIA SESSION SUMMARY
Course Elapsed Days: 10
Plan Fractions Treated to Date: 9
Plan Prescribed Dose Per Fraction: 2 Gy
Plan Total Fractions Prescribed: 23
Plan Total Prescribed Dose: 46 Gy
Reference Point Dosage Given to Date: 18 Gy
Reference Point Session Dosage Given: 2 Gy
Session Number: 9

## 2022-09-08 NOTE — Progress Notes (Signed)
[facility] at [facility] [address]  [place], [place] [address] [phone]   Interval Evaluation  Date of Service: 09/08/22 Patient Name: Donna Barber Patient MRN: [id number] Patient DOB: 1950/07/01 Provider: Henreitta Leber, MD  Identifying Statement:  Donna Barber is a 72 y.o. female with right parietal glioblastoma who presents for initial consultation and evaluation.    Referring Provider: Maurice Small, MD [address] [place],  [place] [address]  Oncologic History: Oncology History  Glioblastoma, IDH-wildtype (HCC)  07/25/2022 Surgery   Craniotomy, right parietal resection with Dr. Shaune Pollack.  Path demonstrates glioblastoma, IDH wild type.   08/29/2022 -  Chemotherapy   Patient is on Treatment Plan : BRAIN GLIOBLASTOMA Radiation Therapy With Concurrent Temozolomide 75 mg/m2 Daily Followed By Sequential Maintenance Temozolomide x 6-12 cycles       Biomarkers:  MGMT Unknown.  IDH 1/2 Wild type.  EGFR Unknown  TERT Unknown   Interval History: Donna Barber presents for follow up, now having completed 2 weeks of radiation and Temodar.  Nausea issues have improved since starting the compazine.  Fatigue is present each day, increased from prior.  Otherwise maintains functional indepednence, no worsening of left sided weakness.   H+P (08/16/22) Patient presents to review recent brain tumor diagnosis.  She presented to medical attention in March 2024 with several days history of new onset severe headache.  CNS imaging demonstrated large enhancing mass in right thalamus and occipital lobe.  She underwent craniotomy, resection with Dr. Shaune Pollack on 07/25/22 at [facility]; path demonstrated glioblastoma IDH wild type.  Following surgery, her left arm and leg were weak, sche completed two weeks of rehab and regained most of her prior function.  Right now has impaired vision on the left, and some weakness of the left arm.  Otherwise functionally  independent.  Medications: Current Outpatient Medications on File Prior to Visit  Medication Sig Dispense Refill   acetaminophen (TYLENOL) 325 MG tablet Take 650 mg by mouth every 6 (six) hours as needed.     ALPRAZolam (XANAX) 0.25 MG tablet Take 0.25 mg by mouth at bedtime as needed for anxiety.     ciprofloxacin (CIPRO) 500 MG tablet Take 500 mg by mouth 2 (two) times daily.     Lacosamide (VIMPAT) 100 MG TABS Take 100 mg by mouth 2 (two) times daily.     lacosamide (VIMPAT) 50 MG TABS tablet Take 1 tablet (50 mg total) by mouth 2 (two) times daily. 60 tablet 2   mirtazapine (REMERON) 15 MG tablet Take 15 mg by mouth at bedtime.     ondansetron (ZOFRAN) 8 MG tablet Take 1 tablet (8 mg total) by mouth every 8 (eight) hours as needed for nausea or vomiting. May take 30-60 minutes prior to Temodar administration if nausea/vomiting occurs as needed. 30 tablet 1   ondansetron (ZOFRAN-ODT) 8 MG disintegrating tablet Take 1 tablet (8 mg total) by mouth every 8 (eight) hours as needed for nausea or vomiting. Take instead of the alternative form of ondansetron and see if this is more effective. 20 tablet 3   pantoprazole (PROTONIX) 40 MG tablet Take 40 mg by mouth 2 (two) times daily.     polyethylene glycol (MIRALAX / GLYCOLAX) 17 g packet Take 17 g by mouth daily.     prochlorperazine (COMPAZINE) 10 MG tablet Take 1 tablet (10 mg total) by mouth every 6 (six) hours as needed for nausea or vomiting. 30 tablet 0  temozolomide (TEMODAR) 100 MG capsule Take 1 capsule (100 mg total) by mouth daily. (Take with 20 mg capsules for total daily dose of 120 mg). May take on an empty stomach to decrease nausea & vomiting. 42 capsule 0   temozolomide (TEMODAR) 20 MG capsule Take 1 capsule (20 mg total) by mouth daily. (Take with 100 mg capsules for total daily dose of 120 mg).May take on an empty stomach to decrease nausea & vomiting. 42 capsule 0   No current facility-administered medications on file prior to  visit.    Allergies:  Allergies  Allergen Reactions   Sulfa Antibiotics Nausea And Vomiting   Sulfur Nausea And Vomiting and Nausea Only    Other Reaction(s): GI Intolerance   Codeine Nausea And Vomiting   Elemental Sulfur    Past Medical History:  Past Medical History:  Diagnosis Date   Abnormal Pap smear 2000   Cin-3 treated with LEEP   Canker sores oral    Depression    Dyspareunia    secondary to  atrophy   GERD (gastroesophageal reflux disease)    History of chicken pox    History of measles, mumps, or rubella    Postmenopausal    Uterine fibroid    Vaginal atrophy    Yeast infection    Past Surgical History:  Past Surgical History:  Procedure Laterality Date   CARPAL TUNNEL RELEASE     DILATION AND CURETTAGE OF UTERUS     shoulder sugery     TUBAL LIGATION     bilateral   Social History:  Social History   Socioeconomic History   Marital status: Married    Spouse name: Not on file   Number of children: Not on file   Years of education: Not on file   Highest education level: Not on file  Occupational History   Not on file  Tobacco Use   Smoking status: Never   Smokeless tobacco: Never  Vaping Use   Vaping Use: Never used  Substance and Sexual Activity   Alcohol use: Yes   Drug use: No   Sexual activity: Yes    Birth control/protection: Surgical  Other Topics Concern   Not on file  Social History Narrative   Not on file   Social Determinants of Health   Financial Resource Strain: Not on file  Food Insecurity: No Food Insecurity (08/16/2022)   Hunger Vital Sign    Worried About Running Out of Food in the Last Year: Never true    Ran Out of Food in the Last Year: Never true  Transportation Needs: No Transportation Needs (08/16/2022)   PRAPARE - Administrator, Civil Service (Medical): No    Lack of Transportation (Non-Medical): No  Physical Activity: Not on file  Stress: Not on file  Social Connections: Not on file  Intimate  Partner Violence: Not At Risk (08/16/2022)   Humiliation, Afraid, Rape, and Kick questionnaire    Fear of Current or Ex-Partner: No    Emotionally Abused: No    Physically Abused: No    Sexually Abused: No   Family History:  Family History  Problem Relation Age of Onset   Hypertension Mother    Diabetes Mother    Heart disease Mother    Cancer Brother     Review of Systems: Constitutional: Doesn't report fevers, chills or abnormal weight loss Eyes: Doesn't report blurriness of vision Ears, nose, mouth, throat, and face: Doesn't report sore throat Respiratory: Doesn't report cough,  dyspnea or wheezes Cardiovascular: Doesn't report palpitation, chest discomfort  Gastrointestinal:  Doesn't report nausea, constipation, diarrhea GU: Doesn't report incontinence Skin: Doesn't report skin rashes Neurological: Per HPI Musculoskeletal: Doesn't report joint pain Behavioral/Psych: Doesn't report anxiety  Physical Exam: Vitals:   09/08/22 0904  BP: (!) 145/73  Pulse: 87  Resp: 17  Temp: (!) 97.5 F (36.4 C)  SpO2: 100%   KPS: 80. General: Alert, cooperative, pleasant, in no acute distress Head: Normal EENT: No conjunctival injection or scleral icterus.  Lungs: Resp effort normal Cardiac: Regular rate Abdomen: Non-distended abdomen Skin: No rashes cyanosis or petechiae. Extremities: No clubbing or edema  Neurologic Exam: Mental Status: Awake, alert, attentive to examiner. Oriented to self and environment. Language is fluent with intact comprehension.  Cranial Nerves: Visual acuity is grossly normal. Left hemianopia noted. Extra-ocular movements intact. No ptosis. Face is symmetric Motor: Tone and bulk are normal. Power is 4/5 in left arm, 4+/5 in left leg. Reflexes are symmetric, no pathologic reflexes present.  Sensory: Intact to light touch Gait: Hemiparetic   Labs: I have reviewed the data as listed No results found for: "NA", "K", "CL", "CO2", "GLUCOSE", "BUN",  "CREATININE", "CALCIUM", "PROT", "ALBUMIN", "AST", "ALT", "ALKPHOS", "BILITOT", "GFRNONAA", "GFRAA" Lab Results  Component Value Date   WBC 6.8 09/08/2022   NEUTROABS 4.5 09/08/2022   HGB 13.1 09/08/2022   HCT 39.9 09/08/2022   MCV 84.2 09/08/2022   PLT 261 09/08/2022   Assessment/Plan Glioblastoma, IDH-wildtype (HCC)  Donna Barber is clinically stable today, now having completed 2 weeks of IMRT Temozolomide.   We ultimately recommended continuing with course of intensity modulated radiation therapy and concurrent daily Temozolomide.  Radiation will be administered Mon-Fri over 6 weeks, Temodar will be dosed at 75mg /m2 to be given daily over 42 days.  We reviewed side effects of temodar, including fatigue, nausea/vomiting, constipation, and cytopenias.  Chemotherapy should be held for the following:  ANC less than 1,000  Platelets less than 100,000  LFT or creatinine greater than 2x ULN  If clinical concerns/contraindications develop  Vimpat will con't 50mg  BID.  Every 2 weeks during radiation, labs will be checked accompanied by a clinical evaluation in the brain tumor clinic.  We appreciate the opportunity to participate in the care of Donna Barber.   All questions were answered. The patient knows to call the clinic with any problems, questions or concerns. No barriers to learning were detected.  The total time spent in the encounter was 30 minutes and more than 50% was on counseling and review of test results   Henreitta Leber, MD Medical Director of Neuro-Oncology [facility] at [facility] 09/08/22 9:07 AM

## 2022-09-09 ENCOUNTER — Other Ambulatory Visit: Payer: Self-pay

## 2022-09-09 ENCOUNTER — Ambulatory Visit
Admission: RE | Admit: 2022-09-09 | Discharge: 2022-09-09 | Disposition: A | Discharge status: Home / Self Care | Payer: Medicare HMO | Source: Ambulatory Visit | Attending: Radiation Oncology | Admitting: Radiation Oncology

## 2022-09-09 DIAGNOSIS — Z510 Encounter for antineoplastic radiation therapy: Secondary | ICD-10-CM | POA: Diagnosis not present

## 2022-09-09 DIAGNOSIS — C714 Malignant neoplasm of occipital lobe: Secondary | ICD-10-CM | POA: Diagnosis not present

## 2022-09-09 LAB — RAD ONC ARIA SESSION SUMMARY
Course Elapsed Days: 11
Plan Fractions Treated to Date: 10
Plan Prescribed Dose Per Fraction: 2 Gy
Plan Total Fractions Prescribed: 23
Plan Total Prescribed Dose: 46 Gy
Reference Point Dosage Given to Date: 20 Gy
Reference Point Session Dosage Given: 2 Gy
Session Number: 10

## 2022-09-13 ENCOUNTER — Other Ambulatory Visit: Payer: Self-pay

## 2022-09-13 ENCOUNTER — Ambulatory Visit
Admission: RE | Admit: 2022-09-13 | Discharge: 2022-09-13 | Disposition: A | Discharge status: Home / Self Care | Payer: Medicare HMO | Source: Ambulatory Visit | Attending: Radiation Oncology | Admitting: Radiation Oncology

## 2022-09-13 DIAGNOSIS — Z510 Encounter for antineoplastic radiation therapy: Secondary | ICD-10-CM | POA: Diagnosis not present

## 2022-09-13 DIAGNOSIS — C714 Malignant neoplasm of occipital lobe: Secondary | ICD-10-CM | POA: Diagnosis not present

## 2022-09-13 LAB — RAD ONC ARIA SESSION SUMMARY
Course Elapsed Days: 15
Plan Fractions Treated to Date: 11
Plan Prescribed Dose Per Fraction: 2 Gy
Plan Total Fractions Prescribed: 23
Plan Total Prescribed Dose: 46 Gy
Reference Point Dosage Given to Date: 22 Gy
Reference Point Session Dosage Given: 2 Gy
Session Number: 11

## 2022-09-14 ENCOUNTER — Ambulatory Visit
Admission: RE | Admit: 2022-09-14 | Discharge: 2022-09-14 | Disposition: A | Discharge status: Home / Self Care | Payer: Medicare HMO | Source: Ambulatory Visit | Attending: Radiation Oncology | Admitting: Radiation Oncology

## 2022-09-14 ENCOUNTER — Other Ambulatory Visit: Payer: Self-pay

## 2022-09-14 DIAGNOSIS — Z510 Encounter for antineoplastic radiation therapy: Secondary | ICD-10-CM | POA: Diagnosis not present

## 2022-09-14 DIAGNOSIS — C714 Malignant neoplasm of occipital lobe: Secondary | ICD-10-CM | POA: Diagnosis not present

## 2022-09-14 LAB — RAD ONC ARIA SESSION SUMMARY
Course Elapsed Days: 16
Plan Fractions Treated to Date: 12
Plan Prescribed Dose Per Fraction: 2 Gy
Plan Total Fractions Prescribed: 23
Plan Total Prescribed Dose: 46 Gy
Reference Point Dosage Given to Date: 24 Gy
Reference Point Session Dosage Given: 2 Gy
Session Number: 12

## 2022-09-15 ENCOUNTER — Ambulatory Visit
Admission: RE | Admit: 2022-09-15 | Discharge: 2022-09-15 | Disposition: A | Discharge status: Home / Self Care | Payer: Medicare HMO | Source: Ambulatory Visit | Attending: Radiation Oncology | Admitting: Radiation Oncology

## 2022-09-15 ENCOUNTER — Telehealth: Payer: Self-pay | Admitting: *Deleted

## 2022-09-15 ENCOUNTER — Other Ambulatory Visit: Payer: Self-pay

## 2022-09-15 DIAGNOSIS — C714 Malignant neoplasm of occipital lobe: Secondary | ICD-10-CM | POA: Diagnosis not present

## 2022-09-15 DIAGNOSIS — Z510 Encounter for antineoplastic radiation therapy: Secondary | ICD-10-CM | POA: Diagnosis not present

## 2022-09-15 LAB — RAD ONC ARIA SESSION SUMMARY
Course Elapsed Days: 17
Plan Fractions Treated to Date: 13
Plan Prescribed Dose Per Fraction: 2 Gy
Plan Total Fractions Prescribed: 23
Plan Total Prescribed Dose: 46 Gy
Reference Point Dosage Given to Date: 26 Gy
Reference Point Session Dosage Given: 2 Gy
Session Number: 13

## 2022-09-15 MED ORDER — PROCHLORPERAZINE MALEATE 10 MG PO TABS: 10.0000 mg | ORAL_TABLET | Freq: Four times a day (QID) | ORAL | 0 refills | Status: DC | PRN

## 2022-09-15 MED ORDER — PANTOPRAZOLE SODIUM 40 MG PO TBEC: 40.0000 mg | DELAYED_RELEASE_TABLET | Freq: Two times a day (BID) | ORAL | 2 refills | Status: DC

## 2022-09-15 MED ORDER — MIRTAZAPINE 15 MG PO TABS: 15.0000 mg | ORAL_TABLET | Freq: Every day | ORAL | 2 refills | Status: DC

## 2022-09-15 NOTE — Telephone Encounter (Signed)
Daughter states patient needs refills of 2 medications that were given to her when she went to rehab, not prescribed by Dr Barbaraann Cao. Pantoprazole 40 mg BID and Remeron 15 mg q HS.  And is also asking for a refill of compazine.

## 2022-09-15 NOTE — Addendum Note (Signed)
Addended by: Henreitta Leber on: 09/15/2022 04:44 PM   Modules accepted: Orders

## 2022-09-16 ENCOUNTER — Other Ambulatory Visit: Payer: Self-pay

## 2022-09-16 ENCOUNTER — Ambulatory Visit
Admission: RE | Admit: 2022-09-16 | Discharge: 2022-09-16 | Disposition: A | Discharge status: Home / Self Care | Payer: Medicare HMO | Source: Ambulatory Visit | Attending: Radiation Oncology | Admitting: Radiation Oncology

## 2022-09-16 DIAGNOSIS — D849 Immunodeficiency, unspecified: Secondary | ICD-10-CM | POA: Diagnosis not present

## 2022-09-16 DIAGNOSIS — H539 Unspecified visual disturbance: Secondary | ICD-10-CM | POA: Diagnosis not present

## 2022-09-16 DIAGNOSIS — Z510 Encounter for antineoplastic radiation therapy: Secondary | ICD-10-CM | POA: Diagnosis not present

## 2022-09-16 DIAGNOSIS — C714 Malignant neoplasm of occipital lobe: Secondary | ICD-10-CM | POA: Diagnosis not present

## 2022-09-16 DIAGNOSIS — C719 Malignant neoplasm of brain, unspecified: Secondary | ICD-10-CM | POA: Diagnosis not present

## 2022-09-16 DIAGNOSIS — M25512 Pain in left shoulder: Secondary | ICD-10-CM | POA: Diagnosis not present

## 2022-09-16 LAB — RAD ONC ARIA SESSION SUMMARY
Course Elapsed Days: 18
Plan Fractions Treated to Date: 14
Plan Prescribed Dose Per Fraction: 2 Gy
Plan Total Fractions Prescribed: 23
Plan Total Prescribed Dose: 46 Gy
Reference Point Dosage Given to Date: 28 Gy
Reference Point Session Dosage Given: 2 Gy
Session Number: 14

## 2022-09-19 ENCOUNTER — Ambulatory Visit
Admission: RE | Admit: 2022-09-19 | Discharge: 2022-09-19 | Disposition: A | Discharge status: Home / Self Care | Payer: Medicare HMO | Source: Ambulatory Visit | Attending: Radiation Oncology | Admitting: Radiation Oncology

## 2022-09-19 ENCOUNTER — Inpatient Hospital Stay: Payer: Medicare HMO

## 2022-09-19 ENCOUNTER — Ambulatory Visit: Payer: Medicare HMO

## 2022-09-19 ENCOUNTER — Inpatient Hospital Stay: Payer: Medicare HMO | Attending: Internal Medicine | Admitting: Internal Medicine

## 2022-09-19 ENCOUNTER — Other Ambulatory Visit: Payer: Self-pay

## 2022-09-19 VITALS — BP 153/73 | HR 96 | Temp 97.3°F | Resp 16 | Wt 121.7 lb

## 2022-09-19 DIAGNOSIS — Z79899 Other long term (current) drug therapy: Secondary | ICD-10-CM | POA: Insufficient documentation

## 2022-09-19 DIAGNOSIS — C714 Malignant neoplasm of occipital lobe: Secondary | ICD-10-CM | POA: Insufficient documentation

## 2022-09-19 DIAGNOSIS — C719 Malignant neoplasm of brain, unspecified: Secondary | ICD-10-CM

## 2022-09-19 DIAGNOSIS — C713 Malignant neoplasm of parietal lobe: Secondary | ICD-10-CM | POA: Insufficient documentation

## 2022-09-19 DIAGNOSIS — R5383 Other fatigue: Secondary | ICD-10-CM | POA: Diagnosis not present

## 2022-09-19 DIAGNOSIS — Z510 Encounter for antineoplastic radiation therapy: Secondary | ICD-10-CM | POA: Diagnosis not present

## 2022-09-19 LAB — CMP (CANCER CENTER ONLY)
ALT: 15 U/L (ref 0–44)
AST: 20 U/L (ref 15–41)
Albumin: 4.4 g/dL (ref 3.5–5.0)
Alkaline Phosphatase: 82 U/L (ref 38–126)
Anion gap: 5 (ref 5–15)
BUN: 11 mg/dL (ref 8–23)
CO2: 31 mmol/L (ref 22–32)
Calcium: 9.6 mg/dL (ref 8.9–10.3)
Chloride: 103 mmol/L (ref 98–111)
Creatinine: 0.73 mg/dL (ref 0.44–1.00)
GFR, Estimated: >60 mL/min (ref >60)
Glucose, Bld: 126 mg/dL — ABNORMAL HIGH (ref 70–99)
Potassium: 4.2 mmol/L (ref 3.5–5.1)
Sodium: 139 mmol/L (ref 135–145)
Total Bilirubin: 0.6 mg/dL (ref 0.3–1.2)
Total Protein: 6.8 g/dL (ref 6.5–8.1)

## 2022-09-19 LAB — CBC WITH DIFFERENTIAL (CANCER CENTER ONLY)
Abs Immature Granulocytes: 0.01 10*3/uL (ref 0.00–0.07)
Basophils Absolute: 0.1 10*3/uL (ref 0.0–0.1)
Basophils Relative: 1 %
Eosinophils Absolute: 0.2 10*3/uL (ref 0.0–0.5)
Eosinophils Relative: 3 %
HCT: 40.1 % (ref 36.0–46.0)
Hemoglobin: 12.9 g/dL (ref 12.0–15.0)
Immature Granulocytes: 0 %
Lymphocytes Relative: 16 %
Lymphs Abs: 0.9 10*3/uL (ref 0.7–4.0)
MCH: 27.4 pg (ref 26.0–34.0)
MCHC: 32.2 g/dL (ref 30.0–36.0)
MCV: 85.1 fL (ref 80.0–100.0)
Monocytes Absolute: 0.6 10*3/uL (ref 0.1–1.0)
Monocytes Relative: 11 %
Neutro Abs: 3.8 10*3/uL (ref 1.7–7.7)
Neutrophils Relative %: 69 %
Platelet Count: 251 10*3/uL (ref 150–400)
RBC: 4.71 MIL/uL (ref 3.87–5.11)
RDW: 13.7 % (ref 11.5–15.5)
WBC Count: 5.5 10*3/uL (ref 4.0–10.5)
nRBC: 0 % (ref 0.0–0.2)

## 2022-09-19 LAB — RAD ONC ARIA SESSION SUMMARY
Course Elapsed Days: 21
Plan Fractions Treated to Date: 15
Plan Prescribed Dose Per Fraction: 2 Gy
Plan Total Fractions Prescribed: 23
Plan Total Prescribed Dose: 46 Gy
Reference Point Dosage Given to Date: 30 Gy
Reference Point Session Dosage Given: 2 Gy
Session Number: 15

## 2022-09-19 NOTE — Progress Notes (Signed)
[facility] at [facility] [address]  [place], [place] [address] [phone]   Interval Evaluation  Date of Service: 09/19/22 Patient Name: Donna Barber Patient MRN: [id number] Patient DOB: 10-Jun-1950 Provider: Henreitta Leber, MD  Identifying Statement:  Donna Barber is a 72 y.o. female with right parietal glioblastoma who presents for initial consultation and evaluation.    Referring Provider: Maurice Small, MD No address on file  Oncologic History: Oncology History  Glioblastoma, IDH-wildtype (HCC)  07/25/2022 Surgery   Craniotomy, right parietal resection with Dr. Shaune Pollack.  Path demonstrates glioblastoma, IDH wild type.   08/29/2022 -  Chemotherapy   Patient is on Treatment Plan : BRAIN GLIOBLASTOMA Radiation Therapy With Concurrent Temozolomide 75 mg/m2 Daily Followed By Sequential Maintenance Temozolomide x 6-12 cycles       Biomarkers:  MGMT Unknown.  IDH 1/2 Wild type.  EGFR Unknown  TERT Unknown   Interval History: Donna Barber presents for follow up, now having completed 4 weeks of radiation and Temodar.  Nausea has been a persistent problem, requiring multiple daily doses of compazine without complete resolution of symptoms.  Fatigue is present each day, unchanged from prior.  Otherwise maintains functional indepednence, no worsening of left sided weakness.   H+P (08/16/22) Patient presents to review recent brain tumor diagnosis.  She presented to medical attention in March 2024 with several days history of new onset severe headache.  CNS imaging demonstrated large enhancing mass in right thalamus and occipital lobe.  She underwent craniotomy, resection with Dr. Shaune Pollack on 07/25/22 at [facility]; path demonstrated glioblastoma IDH wild type.  Following surgery, her left arm and leg were weak, sche completed two weeks of rehab and regained most of her prior function.  Right now has impaired vision on the left, and some weakness of the left  arm.  Otherwise functionally independent.  Medications: Current Outpatient Medications on File Prior to Visit  Medication Sig Dispense Refill   acetaminophen (TYLENOL) 325 MG tablet Take 650 mg by mouth every 6 (six) hours as needed.     ALPRAZolam (XANAX) 0.25 MG tablet Take 0.25 mg by mouth at bedtime as needed for anxiety.     ciprofloxacin (CIPRO) 500 MG tablet Take 500 mg by mouth 2 (two) times daily.     lacosamide (VIMPAT) 50 MG TABS tablet Take 1 tablet (50 mg total) by mouth 2 (two) times daily. 60 tablet 2   mirtazapine (REMERON) 15 MG tablet Take 1 tablet (15 mg total) by mouth at bedtime. 30 tablet 2   ondansetron (ZOFRAN) 8 MG tablet Take 1 tablet (8 mg total) by mouth every 8 (eight) hours as needed for nausea or vomiting. May take 30-60 minutes prior to Temodar administration if nausea/vomiting occurs as needed. 30 tablet 1   ondansetron (ZOFRAN-ODT) 8 MG disintegrating tablet Take 1 tablet (8 mg total) by mouth every 8 (eight) hours as needed for nausea or vomiting. Take instead of the alternative form of ondansetron and see if this is more effective. 20 tablet 3   pantoprazole (PROTONIX) 40 MG tablet Take 1 tablet (40 mg total) by mouth 2 (two) times daily. 30 tablet 2   polyethylene glycol (MIRALAX / GLYCOLAX) 17 g packet Take 17 g by mouth daily.     prochlorperazine (COMPAZINE) 10 MG tablet Take 1 tablet (10 mg total) by mouth every 6 (six) hours as needed for nausea or vomiting. 30 tablet 0   temozolomide (TEMODAR) 100 MG capsule Take 1  capsule (100 mg total) by mouth daily. (Take with 20 mg capsules for total daily dose of 120 mg). May take on an empty stomach to decrease nausea & vomiting. 42 capsule 0   temozolomide (TEMODAR) 20 MG capsule Take 1 capsule (20 mg total) by mouth daily. (Take with 100 mg capsules for total daily dose of 120 mg).May take on an empty stomach to decrease nausea & vomiting. 42 capsule 0   No current facility-administered medications on file prior  to visit.    Allergies:  Allergies  Allergen Reactions   Sulfa Antibiotics Nausea And Vomiting   Sulfur Nausea And Vomiting and Nausea Only    Other Reaction(s): GI Intolerance   Codeine Nausea And Vomiting   Elemental Sulfur    Past Medical History:  Past Medical History:  Diagnosis Date   Abnormal Pap smear 2000   Cin-3 treated with LEEP   Canker sores oral    Depression    Dyspareunia    secondary to  atrophy   GERD (gastroesophageal reflux disease)    History of chicken pox    History of measles, mumps, or rubella    Postmenopausal    Uterine fibroid    Vaginal atrophy    Yeast infection    Past Surgical History:  Past Surgical History:  Procedure Laterality Date   CARPAL TUNNEL RELEASE     DILATION AND CURETTAGE OF UTERUS     shoulder sugery     TUBAL LIGATION     bilateral   Social History:  Social History   Socioeconomic History   Marital status: Married    Spouse name: Not on file   Number of children: Not on file   Years of education: Not on file   Highest education level: Not on file  Occupational History   Not on file  Tobacco Use   Smoking status: Never   Smokeless tobacco: Never  Vaping Use   Vaping Use: Never used  Substance and Sexual Activity   Alcohol use: Yes   Drug use: No   Sexual activity: Yes    Birth control/protection: Surgical  Other Topics Concern   Not on file  Social History Narrative   Not on file   Social Determinants of Health   Financial Resource Strain: Not on file  Food Insecurity: No Food Insecurity (08/16/2022)   Hunger Vital Sign    Worried About Running Out of Food in the Last Year: Never true    Ran Out of Food in the Last Year: Never true  Transportation Needs: No Transportation Needs (08/16/2022)   PRAPARE - Administrator, Civil Service (Medical): No    Lack of Transportation (Non-Medical): No  Physical Activity: Not on file  Stress: Not on file  Social Connections: Not on file  Intimate  Partner Violence: Not At Risk (08/16/2022)   Humiliation, Afraid, Rape, and Kick questionnaire    Fear of Current or Ex-Partner: No    Emotionally Abused: No    Physically Abused: No    Sexually Abused: No   Family History:  Family History  Problem Relation Age of Onset   Hypertension Mother    Diabetes Mother    Heart disease Mother    Cancer Brother     Review of Systems: Constitutional: Doesn't report fevers, chills or abnormal weight loss Eyes: Doesn't report blurriness of vision Ears, nose, mouth, throat, and face: Doesn't report sore throat Respiratory: Doesn't report cough, dyspnea or wheezes Cardiovascular: Doesn't report palpitation,  chest discomfort  Gastrointestinal:  Doesn't report nausea, constipation, diarrhea GU: Doesn't report incontinence Skin: Doesn't report skin rashes Neurological: Per HPI Musculoskeletal: Doesn't report joint pain Behavioral/Psych: Doesn't report anxiety  Physical Exam: Vitals:   09/19/22 0914  BP: (!) 153/73  Pulse: 96  Resp: 16  Temp: (!) 97.3 F (36.3 C)  SpO2: 100%   KPS: 80. General: Alert, cooperative, pleasant, in no acute distress Head: Normal EENT: No conjunctival injection or scleral icterus.  Lungs: Resp effort normal Cardiac: Regular rate Abdomen: Non-distended abdomen Skin: No rashes cyanosis or petechiae. Extremities: No clubbing or edema  Neurologic Exam: Mental Status: Awake, alert, attentive to examiner. Oriented to self and environment. Language is fluent with intact comprehension.  Cranial Nerves: Visual acuity is grossly normal. Left hemianopia noted. Extra-ocular movements intact. No ptosis. Face is symmetric Motor: Tone and bulk are normal. Power is 4/5 in left arm, 4+/5 in left leg. Reflexes are symmetric, no pathologic reflexes present.  Sensory: Intact to light touch Gait: Hemiparetic   Labs: I have reviewed the data as listed    Component Value Date/Time   NA 139 09/08/2022 0838   K 4.4  09/08/2022 0838   CL 104 09/08/2022 0838   CO2 29 09/08/2022 0838   GLUCOSE 104 (H) 09/08/2022 0838   BUN 11 09/08/2022 0838   CREATININE 0.72 09/08/2022 0838   CALCIUM 9.3 09/08/2022 0838   PROT 6.6 09/08/2022 0838   ALBUMIN 4.3 09/08/2022 0838   AST 19 09/08/2022 0838   ALT 17 09/08/2022 0838   ALKPHOS 88 09/08/2022 0838   BILITOT 0.5 09/08/2022 0838   GFRNONAA >60 09/08/2022 0838   Lab Results  Component Value Date   WBC 5.5 09/19/2022   NEUTROABS 3.8 09/19/2022   HGB 12.9 09/19/2022   HCT 40.1 09/19/2022   MCV 85.1 09/19/2022   PLT 251 09/19/2022   Assessment/Plan Glioblastoma, IDH-wildtype (HCC)  Nima W Stolze is clinically stable today, now having completed 4 weeks of IMRT Temozolomide.   We ultimately recommended continuing with course of intensity modulated radiation therapy and concurrent daily Temozolomide.  Radiation will be administered Mon-Fri over 6 weeks, Temodar will be dosed at 75mg /m2 to be given daily over 42 days.  We reviewed side effects of temodar, including fatigue, nausea/vomiting, constipation, and cytopenias.  Temozolomide will be held this week because of refractory nausea.    Chemotherapy should be held for the following:  ANC less than 1,000  Platelets less than 100,000  LFT or creatinine greater than 2x ULN  If clinical concerns/contraindications develop  Vimpat will con't 50mg  BID.  Every 2 weeks during radiation, labs will be checked accompanied by a clinical evaluation in the brain tumor clinic.  We appreciate the opportunity to participate in the care of JUVENTINA DOBRY.  We will touch base with her via phone next week to consider resuming chemo.  All questions were answered. The patient knows to call the clinic with any problems, questions or concerns. No barriers to learning were detected.  The total time spent in the encounter was 30 minutes and more than 50% was on counseling and review of test results   Henreitta Leber,  MD Medical Director of Neuro-Oncology [facility] at [place] 09/19/22 9:18 AM

## 2022-09-20 ENCOUNTER — Other Ambulatory Visit: Payer: Self-pay

## 2022-09-20 ENCOUNTER — Ambulatory Visit
Admission: RE | Admit: 2022-09-20 | Discharge: 2022-09-20 | Disposition: A | Discharge status: Home / Self Care | Payer: Medicare HMO | Source: Ambulatory Visit | Attending: Radiation Oncology | Admitting: Radiation Oncology

## 2022-09-20 DIAGNOSIS — C714 Malignant neoplasm of occipital lobe: Secondary | ICD-10-CM | POA: Diagnosis not present

## 2022-09-20 DIAGNOSIS — Z510 Encounter for antineoplastic radiation therapy: Secondary | ICD-10-CM | POA: Diagnosis not present

## 2022-09-20 LAB — RAD ONC ARIA SESSION SUMMARY
Course Elapsed Days: 22
Plan Fractions Treated to Date: 16
Plan Prescribed Dose Per Fraction: 2 Gy
Plan Total Fractions Prescribed: 23
Plan Total Prescribed Dose: 46 Gy
Reference Point Dosage Given to Date: 32 Gy
Reference Point Session Dosage Given: 2 Gy
Session Number: 16

## 2022-09-21 ENCOUNTER — Other Ambulatory Visit: Payer: Self-pay

## 2022-09-21 ENCOUNTER — Ambulatory Visit
Admission: RE | Admit: 2022-09-21 | Discharge: 2022-09-21 | Disposition: A | Discharge status: Home / Self Care | Payer: Medicare HMO | Source: Ambulatory Visit | Attending: Radiation Oncology | Admitting: Radiation Oncology

## 2022-09-21 DIAGNOSIS — H31011 Macula scars of posterior pole (postinflammatory) (post-traumatic), right eye: Secondary | ICD-10-CM | POA: Diagnosis not present

## 2022-09-21 DIAGNOSIS — H2513 Age-related nuclear cataract, bilateral: Secondary | ICD-10-CM | POA: Diagnosis not present

## 2022-09-21 DIAGNOSIS — C714 Malignant neoplasm of occipital lobe: Secondary | ICD-10-CM | POA: Diagnosis not present

## 2022-09-21 DIAGNOSIS — I639 Cerebral infarction, unspecified: Secondary | ICD-10-CM | POA: Diagnosis not present

## 2022-09-21 DIAGNOSIS — Z510 Encounter for antineoplastic radiation therapy: Secondary | ICD-10-CM | POA: Diagnosis not present

## 2022-09-21 DIAGNOSIS — H53462 Homonymous bilateral field defects, left side: Secondary | ICD-10-CM | POA: Diagnosis not present

## 2022-09-21 LAB — RAD ONC ARIA SESSION SUMMARY
Course Elapsed Days: 23
Plan Fractions Treated to Date: 17
Plan Prescribed Dose Per Fraction: 2 Gy
Plan Total Fractions Prescribed: 23
Plan Total Prescribed Dose: 46 Gy
Reference Point Dosage Given to Date: 34 Gy
Reference Point Session Dosage Given: 2 Gy
Session Number: 17

## 2022-09-22 ENCOUNTER — Other Ambulatory Visit: Payer: Self-pay

## 2022-09-22 ENCOUNTER — Telehealth: Payer: Self-pay

## 2022-09-22 ENCOUNTER — Telehealth: Payer: Self-pay | Admitting: Internal Medicine

## 2022-09-22 ENCOUNTER — Ambulatory Visit
Admission: RE | Admit: 2022-09-22 | Discharge: 2022-09-22 | Disposition: A | Discharge status: Home / Self Care | Payer: Medicare HMO | Source: Ambulatory Visit | Attending: Radiation Oncology | Admitting: Radiation Oncology

## 2022-09-22 DIAGNOSIS — Z510 Encounter for antineoplastic radiation therapy: Secondary | ICD-10-CM | POA: Diagnosis not present

## 2022-09-22 DIAGNOSIS — C714 Malignant neoplasm of occipital lobe: Secondary | ICD-10-CM | POA: Diagnosis not present

## 2022-09-22 DIAGNOSIS — H9201 Otalgia, right ear: Secondary | ICD-10-CM

## 2022-09-22 LAB — RAD ONC ARIA SESSION SUMMARY
Course Elapsed Days: 24
Plan Fractions Treated to Date: 18
Plan Prescribed Dose Per Fraction: 2 Gy
Plan Total Fractions Prescribed: 23
Plan Total Prescribed Dose: 46 Gy
Reference Point Dosage Given to Date: 36 Gy
Reference Point Session Dosage Given: 2 Gy
Session Number: 18

## 2022-09-22 NOTE — Telephone Encounter (Signed)
Prescription called into pt pharmacy for Cortisporin Otic solution, four drops to right ear four times a day. With further instructions to use until ear pain has improved. Rn will call pt and let her know this medication was called in.

## 2022-09-22 NOTE — Telephone Encounter (Addendum)
Rn called pt after receiving a message from medical oncology about this pt needing ear drops for her right ear pain. Rn called pt and she stated Dr. Basilio Cairo had told her to let us know if ear drops needed to be called in for her ear pain. She stated she did need these called in.  Rn also verified that pt uses [facility] in [place] for her medications. Rn will reach out to to Dr. Roselind Messier for assistance with this request as Dr. Basilio Cairo is out the office today.

## 2022-09-22 NOTE — Telephone Encounter (Signed)
Scheduled per 06/03 los, patient has been called and notified. 

## 2022-09-22 NOTE — Telephone Encounter (Signed)
Rn called pt to let her know that ear drops were called into her pharmacy [facility] in [place]). She understood and will pick them up when they are ready.

## 2022-09-23 ENCOUNTER — Ambulatory Visit
Admission: RE | Admit: 2022-09-23 | Discharge: 2022-09-23 | Disposition: A | Discharge status: Home / Self Care | Payer: Medicare HMO | Source: Ambulatory Visit | Attending: Radiation Oncology | Admitting: Radiation Oncology

## 2022-09-23 ENCOUNTER — Other Ambulatory Visit: Payer: Self-pay

## 2022-09-23 ENCOUNTER — Telehealth: Payer: Self-pay | Admitting: *Deleted

## 2022-09-23 ENCOUNTER — Other Ambulatory Visit: Payer: Self-pay | Admitting: Radiation Oncology

## 2022-09-23 DIAGNOSIS — C714 Malignant neoplasm of occipital lobe: Secondary | ICD-10-CM | POA: Diagnosis not present

## 2022-09-23 DIAGNOSIS — Z510 Encounter for antineoplastic radiation therapy: Secondary | ICD-10-CM | POA: Diagnosis not present

## 2022-09-23 LAB — RAD ONC ARIA SESSION SUMMARY
Course Elapsed Days: 25
Plan Fractions Treated to Date: 19
Plan Prescribed Dose Per Fraction: 2 Gy
Plan Total Fractions Prescribed: 23
Plan Total Prescribed Dose: 46 Gy
Reference Point Dosage Given to Date: 38 Gy
Reference Point Session Dosage Given: 2 Gy
Session Number: 19

## 2022-09-23 NOTE — Telephone Encounter (Signed)
CALLED PATIENT'S HUSBAND (BRUCE) AND GAVE HIM THE NUMBER TO DR. MARCELLINO'S OFFICE TO CALL FOR AN APPT., SPOKE WITH PATIENT'S HUSBAND BRUCE AND HE TOLD ME THAT HE WOULD CALL THIS NUMBER

## 2022-09-23 NOTE — Telephone Encounter (Signed)
RETURNED PATIENT'S HUSBAND'S PHONE CALL, SPOKE WITH PATIENT'S HUSBAND (BRUCE)

## 2022-09-26 ENCOUNTER — Other Ambulatory Visit: Payer: Self-pay

## 2022-09-26 ENCOUNTER — Telehealth: Payer: Self-pay

## 2022-09-26 ENCOUNTER — Ambulatory Visit
Admission: RE | Admit: 2022-09-26 | Discharge: 2022-09-26 | Disposition: A | Discharge status: Home / Self Care | Payer: Medicare HMO | Source: Ambulatory Visit | Attending: Radiation Oncology | Admitting: Radiation Oncology

## 2022-09-26 ENCOUNTER — Telehealth: Payer: Self-pay | Admitting: Radiology

## 2022-09-26 ENCOUNTER — Inpatient Hospital Stay (HOSPITAL_BASED_OUTPATIENT_CLINIC_OR_DEPARTMENT_OTHER): Payer: Medicare HMO | Admitting: Internal Medicine

## 2022-09-26 DIAGNOSIS — C719 Malignant neoplasm of brain, unspecified: Secondary | ICD-10-CM

## 2022-09-26 DIAGNOSIS — C714 Malignant neoplasm of occipital lobe: Secondary | ICD-10-CM | POA: Diagnosis not present

## 2022-09-26 DIAGNOSIS — Z510 Encounter for antineoplastic radiation therapy: Secondary | ICD-10-CM | POA: Diagnosis not present

## 2022-09-26 LAB — RAD ONC ARIA SESSION SUMMARY
Course Elapsed Days: 28
Plan Fractions Treated to Date: 20
Plan Prescribed Dose Per Fraction: 2 Gy
Plan Total Fractions Prescribed: 23
Plan Total Prescribed Dose: 46 Gy
Reference Point Dosage Given to Date: 40 Gy
Reference Point Session Dosage Given: 2 Gy
Session Number: 20

## 2022-09-26 NOTE — Telephone Encounter (Signed)
Rn called pt husband to let him know that Rn was able to get an earlier appointment with ENT office. She will now see Tressia Miners PA on Thursday June 13th at 1pm, this will be at the [address] location. Pt husband understood and was grateful for the phone call.

## 2022-09-26 NOTE — Progress Notes (Signed)
I connected with Pierre Bali on 09/26/22 at  2:00 PM EDT by telephone visit and verified that I am speaking with the correct person using two identifiers.  I discussed the limitations, risks, security and privacy concerns of performing an evaluation and management service by telemedicine and the availability of in-person appointments. I also discussed with the patient that there may be a patient responsible charge related to this service. The patient expressed understanding and agreed to proceed.  Other persons participating in the visit and their role in the encounter:  n/a   Patient's location:  Home Provider's location:  Office Chief Complaint:  Glioblastoma, IDH-wildtype (HCC)  History of Present Ilness: Donna Barber reports no clinical changes today.  She continues to tolerate treatment well without complication.  Recently visited ophthalmology, no intervention recommended at this time.    Observations: Language and cognition at baseline  Assessment and Plan: Glioblastoma, IDH-wildtype (HCC)  Follow Up Instructions:  We ask that Donna Barber return to clinic in 1 week, or sooner as needed.  I discussed the assessment and treatment plan with the patient.  The patient was provided an opportunity to ask questions and all were answered.  The patient agreed with the plan and demonstrated understanding of the instructions.    The patient was advised to call back or seek an in-person evaluation if the symptoms worsen or if the condition fails to improve as anticipated.    Henreitta Leber, MD   I provided 18 minutes of non face-to-face telephone visit time during this encounter, and > 50% was spent counseling as documented under my assessment & plan.

## 2022-09-26 NOTE — Telephone Encounter (Signed)
Rn called pt to check on the status of her ear pain after her husband called back and left voicemail. Pt states her right ear was still hurting but the shooting pains have subsided mostly. Now she reports the pain goes down her jaw bone from her right ear. She states it is more uncomfortable when she tilts hear head as well. She denies any hearing loss with this pain. She describes it overall as a general ache and reports the ear drops are helping. Rn will reach out ENT office to see about getting pt a closer appointment, her current appointment is not until July 30th. Rn will work on this follow up.

## 2022-09-26 NOTE — Telephone Encounter (Signed)
Donna Barber informed me about an encounter the patient had with her eye doctor that was bothersome to her. I called the patient today to reassure her that we would only administer the treatment if it provided additional benefits. Despite the treatment being palliative, the aim is to enhance her quality of life and improve her prognosis. Patient expressed understanding is motivated to complete her treatment.     Joyice Faster, PA-C

## 2022-09-26 NOTE — Telephone Encounter (Signed)
Rn called patients husband to ensure the pt was able to get an appointment with the ENT md. Pt husband stated she was actually in house for treatment this am and would have her call RN when she got home and had the information. He did state on the phone as well that the pt was upset about going to the eye doctor recently. He stated the eye doctor told her she had a "poor prognosis" and most pts in her case "would rather die then seek treatment". She was upset and confused by this information coming from him (she was seeing him for macular degeneration), not cancer care. She would like to speak to Dr. Basilio Cairo about this, to make sure nothing has changed in her prognosis. Rn will also reach out to [place] PA to see if she can speak with pt while Dr. Basilio Cairo is away. Rn will continue to follow up on this patients needs.

## 2022-09-27 ENCOUNTER — Other Ambulatory Visit: Payer: Self-pay

## 2022-09-27 ENCOUNTER — Ambulatory Visit
Admission: RE | Admit: 2022-09-27 | Discharge: 2022-09-27 | Disposition: A | Discharge status: Home / Self Care | Payer: Medicare HMO | Source: Ambulatory Visit | Attending: Radiation Oncology | Admitting: Radiation Oncology

## 2022-09-27 ENCOUNTER — Telehealth: Payer: Self-pay | Admitting: *Deleted

## 2022-09-27 DIAGNOSIS — C714 Malignant neoplasm of occipital lobe: Secondary | ICD-10-CM | POA: Diagnosis not present

## 2022-09-27 DIAGNOSIS — Z510 Encounter for antineoplastic radiation therapy: Secondary | ICD-10-CM | POA: Diagnosis not present

## 2022-09-27 LAB — RAD ONC ARIA SESSION SUMMARY
Course Elapsed Days: 29
Plan Fractions Treated to Date: 21
Plan Prescribed Dose Per Fraction: 2 Gy
Plan Total Fractions Prescribed: 23
Plan Total Prescribed Dose: 46 Gy
Reference Point Dosage Given to Date: 42 Gy
Reference Point Session Dosage Given: 2 Gy
Session Number: 21

## 2022-09-27 NOTE — Telephone Encounter (Signed)
Returned PC to patient's daughter, Marcelino Duster - she called earlier with question of when her mother should resume Temodar.  Informed her that, per Dr Barbaraann Cao, patient may resume taking her temodar tonight.  Informed daughter that patient may take compazine as needed for her nausea.  Michelle verbalizes understanding.

## 2022-09-28 ENCOUNTER — Ambulatory Visit: Payer: Medicare HMO

## 2022-09-28 ENCOUNTER — Telehealth: Payer: Self-pay | Admitting: *Deleted

## 2022-09-28 ENCOUNTER — Encounter: Payer: Self-pay | Admitting: Internal Medicine

## 2022-09-28 NOTE — Telephone Encounter (Signed)
Husband states Donna Barber was sick and vomiting during the night. Took compazine 1 hour before taking temodar at bedtime. She cancelled RT this morning. She has been sleeping a lot today, but has not been vomiting today.Has eaten yogurt and fluids.  Compazine is usually effective with her nausea.   Instructed to take compazine 30 minutes prior to temodar, take temodar on an empty stomach. If gets nauseated during the night, take Zofran ODT. Call us in the morning to let us know how she is doing.

## 2022-09-28 NOTE — Telephone Encounter (Signed)
Notified to hold temodar if she is nauseated again tonight. Plans to call us in the morning with an update

## 2022-09-29 ENCOUNTER — Ambulatory Visit
Admission: RE | Admit: 2022-09-29 | Discharge: 2022-09-29 | Disposition: A | Discharge status: Home / Self Care | Payer: Medicare HMO | Source: Ambulatory Visit | Attending: Radiation Oncology | Admitting: Radiation Oncology

## 2022-09-29 ENCOUNTER — Other Ambulatory Visit: Payer: Self-pay

## 2022-09-29 DIAGNOSIS — Z510 Encounter for antineoplastic radiation therapy: Secondary | ICD-10-CM | POA: Diagnosis not present

## 2022-09-29 DIAGNOSIS — C714 Malignant neoplasm of occipital lobe: Secondary | ICD-10-CM | POA: Diagnosis not present

## 2022-09-29 DIAGNOSIS — M26621 Arthralgia of right temporomandibular joint: Secondary | ICD-10-CM | POA: Diagnosis not present

## 2022-09-29 DIAGNOSIS — H9201 Otalgia, right ear: Secondary | ICD-10-CM | POA: Diagnosis not present

## 2022-09-29 LAB — RAD ONC ARIA SESSION SUMMARY
Course Elapsed Days: 31
Plan Fractions Treated to Date: 22
Plan Prescribed Dose Per Fraction: 2 Gy
Plan Total Fractions Prescribed: 23
Plan Total Prescribed Dose: 46 Gy
Reference Point Dosage Given to Date: 44 Gy
Reference Point Session Dosage Given: 2 Gy
Session Number: 22

## 2022-09-30 ENCOUNTER — Ambulatory Visit: Payer: Medicare HMO

## 2022-09-30 ENCOUNTER — Other Ambulatory Visit: Payer: Self-pay

## 2022-09-30 ENCOUNTER — Ambulatory Visit
Admission: RE | Admit: 2022-09-30 | Discharge: 2022-09-30 | Disposition: A | Discharge status: Home / Self Care | Payer: Medicare HMO | Source: Ambulatory Visit | Attending: Radiation Oncology | Admitting: Radiation Oncology

## 2022-09-30 DIAGNOSIS — C714 Malignant neoplasm of occipital lobe: Secondary | ICD-10-CM | POA: Diagnosis not present

## 2022-09-30 DIAGNOSIS — Z510 Encounter for antineoplastic radiation therapy: Secondary | ICD-10-CM | POA: Diagnosis not present

## 2022-09-30 LAB — RAD ONC ARIA SESSION SUMMARY
Course Elapsed Days: 32
Plan Fractions Treated to Date: 23
Plan Prescribed Dose Per Fraction: 2 Gy
Plan Total Fractions Prescribed: 23
Plan Total Prescribed Dose: 46 Gy
Reference Point Dosage Given to Date: 46 Gy
Reference Point Session Dosage Given: 2 Gy
Session Number: 23

## 2022-10-03 ENCOUNTER — Other Ambulatory Visit: Payer: Self-pay

## 2022-10-03 ENCOUNTER — Ambulatory Visit: Payer: Medicare HMO

## 2022-10-03 ENCOUNTER — Ambulatory Visit (HOSPITAL_COMMUNITY)
Admission: RE | Admit: 2022-10-03 | Discharge: 2022-10-03 | Disposition: A | Discharge status: Home / Self Care | Payer: Medicare HMO | Source: Ambulatory Visit | Attending: Radiation Oncology | Admitting: Radiation Oncology

## 2022-10-03 ENCOUNTER — Inpatient Hospital Stay: Payer: Medicare HMO

## 2022-10-03 ENCOUNTER — Ambulatory Visit
Admission: RE | Admit: 2022-10-03 | Discharge: 2022-10-03 | Disposition: A | Discharge status: Home / Self Care | Payer: Medicare HMO | Source: Ambulatory Visit | Attending: Radiation Oncology | Admitting: Radiation Oncology

## 2022-10-03 ENCOUNTER — Inpatient Hospital Stay: Payer: Medicare HMO | Admitting: Internal Medicine

## 2022-10-03 VITALS — BP 145/69 | HR 73 | Temp 97.7°F | Resp 18 | Ht 65.0 in | Wt 118.6 lb

## 2022-10-03 DIAGNOSIS — C719 Malignant neoplasm of brain, unspecified: Secondary | ICD-10-CM

## 2022-10-03 DIAGNOSIS — C713 Malignant neoplasm of parietal lobe: Secondary | ICD-10-CM | POA: Diagnosis not present

## 2022-10-03 DIAGNOSIS — M7989 Other specified soft tissue disorders: Secondary | ICD-10-CM | POA: Diagnosis not present

## 2022-10-03 DIAGNOSIS — C714 Malignant neoplasm of occipital lobe: Secondary | ICD-10-CM

## 2022-10-03 DIAGNOSIS — R5383 Other fatigue: Secondary | ICD-10-CM | POA: Diagnosis not present

## 2022-10-03 DIAGNOSIS — Z79899 Other long term (current) drug therapy: Secondary | ICD-10-CM | POA: Diagnosis not present

## 2022-10-03 DIAGNOSIS — Z510 Encounter for antineoplastic radiation therapy: Secondary | ICD-10-CM | POA: Diagnosis not present

## 2022-10-03 LAB — RAD ONC ARIA SESSION SUMMARY
Course Elapsed Days: 35
Plan Fractions Treated to Date: 1
Plan Prescribed Dose Per Fraction: 2 Gy
Plan Total Fractions Prescribed: 7
Plan Total Prescribed Dose: 14 Gy
Reference Point Dosage Given to Date: 2 Gy
Reference Point Session Dosage Given: 2 Gy
Session Number: 24

## 2022-10-03 LAB — CBC WITH DIFFERENTIAL (CANCER CENTER ONLY)
Abs Immature Granulocytes: 0 10*3/uL (ref 0.00–0.07)
Basophils Absolute: 0.1 10*3/uL (ref 0.0–0.1)
Basophils Relative: 1 %
Eosinophils Absolute: 0.1 10*3/uL (ref 0.0–0.5)
Eosinophils Relative: 2 %
HCT: 39.5 % (ref 36.0–46.0)
Hemoglobin: 12.9 g/dL (ref 12.0–15.0)
Immature Granulocytes: 0 %
Lymphocytes Relative: 14 %
Lymphs Abs: 0.7 10*3/uL (ref 0.7–4.0)
MCH: 27.7 pg (ref 26.0–34.0)
MCHC: 32.7 g/dL (ref 30.0–36.0)
MCV: 84.8 fL (ref 80.0–100.0)
Monocytes Absolute: 0.7 10*3/uL (ref 0.1–1.0)
Monocytes Relative: 14 %
Neutro Abs: 3.4 10*3/uL (ref 1.7–7.7)
Neutrophils Relative %: 69 %
Platelet Count: 225 10*3/uL (ref 150–400)
RBC: 4.66 MIL/uL (ref 3.87–5.11)
RDW: 14 % (ref 11.5–15.5)
WBC Count: 4.9 10*3/uL (ref 4.0–10.5)
nRBC: 0 % (ref 0.0–0.2)

## 2022-10-03 LAB — CMP (CANCER CENTER ONLY)
ALT: 9 U/L (ref 0–44)
AST: 17 U/L (ref 15–41)
Albumin: 4.2 g/dL (ref 3.5–5.0)
Alkaline Phosphatase: 68 U/L (ref 38–126)
Anion gap: 8 (ref 5–15)
BUN: 10 mg/dL (ref 8–23)
CO2: 29 mmol/L (ref 22–32)
Calcium: 9.8 mg/dL (ref 8.9–10.3)
Chloride: 99 mmol/L (ref 98–111)
Creatinine: 0.67 mg/dL (ref 0.44–1.00)
GFR, Estimated: >60 mL/min (ref >60)
Glucose, Bld: 107 mg/dL — ABNORMAL HIGH (ref 70–99)
Potassium: 4.2 mmol/L (ref 3.5–5.1)
Sodium: 136 mmol/L (ref 135–145)
Total Bilirubin: 0.7 mg/dL (ref 0.3–1.2)
Total Protein: 6.2 g/dL — ABNORMAL LOW (ref 6.5–8.1)

## 2022-10-03 NOTE — Progress Notes (Signed)
Left hand x ray ordered per Dr. Basilio Cairo

## 2022-10-03 NOTE — Progress Notes (Signed)
[facility] [address]  [place], [place] [address] [phone]   Interval Evaluation  Date of Service: 10/03/22 Patient Name: XANIA GANSTER Patient MRN: [id number] Patient DOB: 1950-11-24 Provider: Henreitta Leber, MD  Identifying Statement:  SASHIA TATE is a 72 y.o. female with right parietal glioblastoma who presents for initial consultation and evaluation.    Referring Provider: Maurice Small, MD [address] [place],  [place] [address]  Oncologic History: Oncology History  Glioblastoma, IDH-wildtype (HCC)  07/25/2022 Surgery   Craniotomy, right parietal resection with Dr. Shaune Pollack.  Path demonstrates glioblastoma, IDH wild type.   08/29/2022 -  Chemotherapy   Patient is on Treatment Plan : BRAIN GLIOBLASTOMA Radiation Therapy With Concurrent Temozolomide 75 mg/m2 Daily Followed By Sequential Maintenance Temozolomide x 6-12 cycles       Biomarkers:  MGMT Unknown.  IDH 1/2 Wild type.  EGFR Unknown  TERT Unknown   Interval History: ALEIAH BELFIELD presents for follow up, now having completed 5 weeks of radiation and Temodar.  Nausea has improved with dosing of compazine more regularly.  Fatigue is present each day, worsening a little each week of treatment.  Otherwise maintains functional indepednence, no worsening of left sided weakness.   H+P (08/16/22) Patient presents to review recent brain tumor diagnosis.  She presented to medical attention in March 2024 with several days history of new onset severe headache.  CNS imaging demonstrated large enhancing mass in right thalamus and occipital lobe.  She underwent craniotomy, resection with Dr. Shaune Pollack on 07/25/22 at [facility]; path demonstrated glioblastoma IDH wild type.  Following surgery, her left arm and leg were weak, sche completed two weeks of rehab and regained most of her prior function.  Right now has impaired vision on the left, and some weakness of the left arm.   Otherwise functionally independent.  Medications: Current Outpatient Medications on File Prior to Visit  Medication Sig Dispense Refill   lacosamide (VIMPAT) 50 MG TABS tablet Take 1 tablet (50 mg total) by mouth 2 (two) times daily. 60 tablet 2   pantoprazole (PROTONIX) 40 MG tablet Take 1 tablet (40 mg total) by mouth 2 (two) times daily. 30 tablet 2   prochlorperazine (COMPAZINE) 10 MG tablet Take 1 tablet (10 mg total) by mouth every 6 (six) hours as needed for nausea or vomiting. 30 tablet 0   temozolomide (TEMODAR) 100 MG capsule Take 1 capsule (100 mg total) by mouth daily. (Take with 20 mg capsules for total daily dose of 120 mg). May take on an empty stomach to decrease nausea & vomiting. 42 capsule 0   temozolomide (TEMODAR) 20 MG capsule Take 1 capsule (20 mg total) by mouth daily. (Take with 100 mg capsules for total daily dose of 120 mg).May take on an empty stomach to decrease nausea & vomiting. 42 capsule 0   acetaminophen (TYLENOL) 325 MG tablet Take 650 mg by mouth every 6 (six) hours as needed. (Patient not taking: Reported on 10/03/2022)     ALPRAZolam (XANAX) 0.25 MG tablet Take 0.25 mg by mouth at bedtime as needed for anxiety. (Patient not taking: Reported on 10/03/2022)     No current facility-administered medications on file prior to visit.    Allergies:  Allergies  Allergen Reactions   Sulfa Antibiotics Nausea And Vomiting   Sulfur Nausea And Vomiting and Nausea Only    Other Reaction(s): GI Intolerance   Codeine Nausea And Vomiting   Elemental Sulfur  Past Medical History:  Past Medical History:  Diagnosis Date   Abnormal Pap smear 2000   Cin-3 treated with LEEP   Canker sores oral    Depression    Dyspareunia    secondary to  atrophy   GERD (gastroesophageal reflux disease)    History of chicken pox    History of measles, mumps, or rubella    Postmenopausal    Uterine fibroid    Vaginal atrophy    Yeast infection    Past Surgical History:  Past  Surgical History:  Procedure Laterality Date   CARPAL TUNNEL RELEASE     DILATION AND CURETTAGE OF UTERUS     shoulder sugery     TUBAL LIGATION     bilateral   Social History:  Social History   Socioeconomic History   Marital status: Married    Spouse name: Not on file   Number of children: Not on file   Years of education: Not on file   Highest education level: Not on file  Occupational History   Not on file  Tobacco Use   Smoking status: Never   Smokeless tobacco: Never  Vaping Use   Vaping Use: Never used  Substance and Sexual Activity   Alcohol use: Yes   Drug use: No   Sexual activity: Yes    Birth control/protection: Surgical  Other Topics Concern   Not on file  Social History Narrative   Not on file   Social Determinants of Health   Financial Resource Strain: Not on file  Food Insecurity: No Food Insecurity (08/16/2022)   Hunger Vital Sign    Worried About Running Out of Food in the Last Year: Never true    Ran Out of Food in the Last Year: Never true  Transportation Needs: No Transportation Needs (08/16/2022)   PRAPARE - Administrator, Civil Service (Medical): No    Lack of Transportation (Non-Medical): No  Physical Activity: Not on file  Stress: Not on file  Social Connections: Not on file  Intimate Partner Violence: Not At Risk (08/16/2022)   Humiliation, Afraid, Rape, and Kick questionnaire    Fear of Current or Ex-Partner: No    Emotionally Abused: No    Physically Abused: No    Sexually Abused: No   Family History:  Family History  Problem Relation Age of Onset   Hypertension Mother    Diabetes Mother    Heart disease Mother    Cancer Brother     Review of Systems: Constitutional: Doesn't report fevers, chills or abnormal weight loss Eyes: Doesn't report blurriness of vision Ears, nose, mouth, throat, and face: Doesn't report sore throat Respiratory: Doesn't report cough, dyspnea or wheezes Cardiovascular: Doesn't report  palpitation, chest discomfort  Gastrointestinal:  Doesn't report nausea, constipation, diarrhea GU: Doesn't report incontinence Skin: Doesn't report skin rashes Neurological: Per HPI Musculoskeletal: Doesn't report joint pain Behavioral/Psych: Doesn't report anxiety  Physical Exam: Vitals:   10/03/22 1023  BP: (!) 145/69  Pulse: 73  Resp: 18  Temp: 97.7 F (36.5 C)  SpO2: 100%   KPS: 80. General: Alert, cooperative, pleasant, in no acute distress Head: Normal EENT: No conjunctival injection or scleral icterus.  Lungs: Resp effort normal Cardiac: Regular rate Abdomen: Non-distended abdomen Skin: No rashes cyanosis or petechiae. Extremities: No clubbing or edema  Neurologic Exam: Mental Status: Awake, alert, attentive to examiner. Oriented to self and environment. Language is fluent with intact comprehension.  Cranial Nerves: Visual acuity is grossly normal.  Left hemianopia noted. Extra-ocular movements intact. No ptosis. Face is symmetric Motor: Tone and bulk are normal. Power is 4/5 in left arm, 4+/5 in left leg. Reflexes are symmetric, no pathologic reflexes present.  Sensory: Intact to light touch Gait: Hemiparetic   Labs: I have reviewed the data as listed    Component Value Date/Time   NA 136 10/03/2022 1013   K 4.2 10/03/2022 1013   CL 99 10/03/2022 1013   CO2 29 10/03/2022 1013   GLUCOSE 107 (H) 10/03/2022 1013   BUN 10 10/03/2022 1013   CREATININE 0.67 10/03/2022 1013   CALCIUM 9.8 10/03/2022 1013   PROT 6.2 (L) 10/03/2022 1013   ALBUMIN 4.2 10/03/2022 1013   AST 17 10/03/2022 1013   ALT 9 10/03/2022 1013   ALKPHOS 68 10/03/2022 1013   BILITOT 0.7 10/03/2022 1013   GFRNONAA >60 10/03/2022 1013   Lab Results  Component Value Date   WBC 4.9 10/03/2022   NEUTROABS 3.4 10/03/2022   HGB 12.9 10/03/2022   HCT 39.5 10/03/2022   MCV 84.8 10/03/2022   PLT 225 10/03/2022   Assessment/Plan Glioblastoma, IDH-wildtype (HCC)  Mckenzie W Odonald is clinically  stable today, now having completed 5+ weeks of IMRT Temozolomide.   We ultimately recommended completing course of intensity modulated radiation therapy and concurrent daily Temozolomide.  Radiation will be administered Mon-Fri over 6 weeks, Temodar will be dosed at 75mg /m2 to be given daily over 42 days.  We reviewed side effects of temodar, including fatigue, nausea/vomiting, constipation, and cytopenias.  Chemotherapy should be held for the following:  ANC less than 1,000  Platelets less than 100,000  LFT or creatinine greater than 2x ULN  If clinical concerns/contraindications develop  Vimpat will con't 50mg  BID.  Every 2 weeks during radiation, labs will be checked accompanied by a clinical evaluation in the brain tumor clinic.  We appreciate the opportunity to participate in the care of DEAUNDRA SCHRAG.    We ask that DAWNNE CATALLO return to clinic in 1 months following next brain MRI, or sooner as needed.  All questions were answered. The patient knows to call the clinic with any problems, questions or concerns. No barriers to learning were detected.  The total time spent in the encounter was 30 minutes and more than 50% was on counseling and review of test results   Henreitta Leber, MD Medical Director of Neuro-Oncology [facility] 10/03/22 10:47 AM

## 2022-10-04 ENCOUNTER — Other Ambulatory Visit: Payer: Self-pay

## 2022-10-04 ENCOUNTER — Ambulatory Visit
Admission: RE | Admit: 2022-10-04 | Discharge: 2022-10-04 | Disposition: A | Discharge status: Home / Self Care | Payer: Medicare HMO | Source: Ambulatory Visit | Attending: Radiation Oncology | Admitting: Radiation Oncology

## 2022-10-04 ENCOUNTER — Telehealth: Payer: Self-pay

## 2022-10-04 DIAGNOSIS — Z510 Encounter for antineoplastic radiation therapy: Secondary | ICD-10-CM | POA: Diagnosis not present

## 2022-10-04 DIAGNOSIS — C714 Malignant neoplasm of occipital lobe: Secondary | ICD-10-CM | POA: Diagnosis not present

## 2022-10-04 LAB — RAD ONC ARIA SESSION SUMMARY
Course Elapsed Days: 36
Plan Fractions Treated to Date: 2
Plan Prescribed Dose Per Fraction: 2 Gy
Plan Total Fractions Prescribed: 7
Plan Total Prescribed Dose: 14 Gy
Reference Point Dosage Given to Date: 4 Gy
Reference Point Session Dosage Given: 2 Gy
Session Number: 25

## 2022-10-04 NOTE — Telephone Encounter (Signed)
Rn called pt daughter to let her know that her mom's hand x ray looked normal with no fractures or dislocations. Pt daughter still concerned for swelling in this left hand and that her mom is not moving it that much (since her stroke). She does have an ortho referral to Dr. Shelle Iron with [facility] but not until July 25th. Rn will attempt to call and get this appointment moved up for pt . Rn left message for referral coordinator/ scheduler to call RN back. Rn will call pt daughter back when ortho office calls back.

## 2022-10-05 ENCOUNTER — Ambulatory Visit
Admission: RE | Admit: 2022-10-05 | Discharge: 2022-10-05 | Disposition: A | Discharge status: Home / Self Care | Payer: Medicare HMO | Source: Ambulatory Visit | Attending: Radiation Oncology | Admitting: Radiation Oncology

## 2022-10-05 ENCOUNTER — Telehealth: Payer: Self-pay

## 2022-10-05 ENCOUNTER — Other Ambulatory Visit: Payer: Self-pay

## 2022-10-05 DIAGNOSIS — C714 Malignant neoplasm of occipital lobe: Secondary | ICD-10-CM | POA: Diagnosis not present

## 2022-10-05 DIAGNOSIS — Z510 Encounter for antineoplastic radiation therapy: Secondary | ICD-10-CM | POA: Diagnosis not present

## 2022-10-05 LAB — RAD ONC ARIA SESSION SUMMARY
Course Elapsed Days: 37
Plan Fractions Treated to Date: 3
Plan Prescribed Dose Per Fraction: 2 Gy
Plan Total Fractions Prescribed: 7
Plan Total Prescribed Dose: 14 Gy
Reference Point Dosage Given to Date: 6 Gy
Reference Point Session Dosage Given: 2 Gy
Session Number: 26

## 2022-10-05 NOTE — Telephone Encounter (Signed)
Rn called pt daughter back about orthopedic appointment. Rn called [facility] office to check appointment time and date. Her appointment is for June 27th at 3:30pm. Pt thought her appointment was in July. Pt daughter was grateful for the call and clarification of time/date of orthopedic appointment.

## 2022-10-06 ENCOUNTER — Ambulatory Visit
Admission: RE | Admit: 2022-10-06 | Discharge: 2022-10-06 | Disposition: A | Discharge status: Home / Self Care | Payer: Medicare HMO | Source: Ambulatory Visit | Attending: Radiation Oncology | Admitting: Radiation Oncology

## 2022-10-06 ENCOUNTER — Other Ambulatory Visit: Payer: Self-pay

## 2022-10-06 DIAGNOSIS — Z510 Encounter for antineoplastic radiation therapy: Secondary | ICD-10-CM | POA: Diagnosis not present

## 2022-10-06 DIAGNOSIS — C714 Malignant neoplasm of occipital lobe: Secondary | ICD-10-CM | POA: Diagnosis not present

## 2022-10-06 LAB — RAD ONC ARIA SESSION SUMMARY
Course Elapsed Days: 38
Plan Fractions Treated to Date: 4
Plan Prescribed Dose Per Fraction: 2 Gy
Plan Total Fractions Prescribed: 7
Plan Total Prescribed Dose: 14 Gy
Reference Point Dosage Given to Date: 8 Gy
Reference Point Session Dosage Given: 2 Gy
Session Number: 27

## 2022-10-07 ENCOUNTER — Ambulatory Visit
Admission: RE | Admit: 2022-10-07 | Discharge: 2022-10-07 | Disposition: A | Discharge status: Home / Self Care | Payer: Medicare HMO | Source: Ambulatory Visit | Attending: Radiation Oncology | Admitting: Radiation Oncology

## 2022-10-07 ENCOUNTER — Other Ambulatory Visit: Payer: Self-pay

## 2022-10-07 DIAGNOSIS — C714 Malignant neoplasm of occipital lobe: Secondary | ICD-10-CM | POA: Diagnosis not present

## 2022-10-07 DIAGNOSIS — Z510 Encounter for antineoplastic radiation therapy: Secondary | ICD-10-CM | POA: Diagnosis not present

## 2022-10-07 LAB — RAD ONC ARIA SESSION SUMMARY
Course Elapsed Days: 39
Plan Fractions Treated to Date: 5
Plan Prescribed Dose Per Fraction: 2 Gy
Plan Total Fractions Prescribed: 7
Plan Total Prescribed Dose: 14 Gy
Reference Point Dosage Given to Date: 10 Gy
Reference Point Session Dosage Given: 2 Gy
Session Number: 28

## 2022-10-08 ENCOUNTER — Other Ambulatory Visit: Payer: Self-pay | Admitting: Hematology and Oncology

## 2022-10-08 ENCOUNTER — Other Ambulatory Visit: Payer: Self-pay

## 2022-10-08 ENCOUNTER — Other Ambulatory Visit: Payer: Self-pay | Admitting: Internal Medicine

## 2022-10-08 MED ORDER — PROCHLORPERAZINE MALEATE 10 MG PO TABS: 10.0000 mg | ORAL_TABLET | Freq: Four times a day (QID) | ORAL | 0 refills | Status: DC | PRN

## 2022-10-10 ENCOUNTER — Encounter: Payer: Self-pay | Admitting: Internal Medicine

## 2022-10-10 ENCOUNTER — Other Ambulatory Visit: Payer: Self-pay

## 2022-10-10 ENCOUNTER — Ambulatory Visit
Admission: RE | Admit: 2022-10-10 | Discharge: 2022-10-10 | Disposition: A | Discharge status: Home / Self Care | Payer: Medicare HMO | Source: Ambulatory Visit | Attending: Radiation Oncology | Admitting: Radiation Oncology

## 2022-10-10 ENCOUNTER — Ambulatory Visit: Payer: Medicare HMO

## 2022-10-10 DIAGNOSIS — Z510 Encounter for antineoplastic radiation therapy: Secondary | ICD-10-CM | POA: Diagnosis not present

## 2022-10-10 DIAGNOSIS — C714 Malignant neoplasm of occipital lobe: Secondary | ICD-10-CM | POA: Diagnosis not present

## 2022-10-10 LAB — RAD ONC ARIA SESSION SUMMARY
Course Elapsed Days: 42
Plan Fractions Treated to Date: 6
Plan Prescribed Dose Per Fraction: 2 Gy
Plan Total Fractions Prescribed: 7
Plan Total Prescribed Dose: 14 Gy
Reference Point Dosage Given to Date: 12 Gy
Reference Point Session Dosage Given: 2 Gy
Session Number: 29

## 2022-10-11 ENCOUNTER — Other Ambulatory Visit: Payer: Self-pay

## 2022-10-11 ENCOUNTER — Ambulatory Visit
Admission: RE | Admit: 2022-10-11 | Discharge: 2022-10-11 | Disposition: A | Discharge status: Home / Self Care | Payer: Medicare HMO | Source: Ambulatory Visit | Attending: Radiation Oncology | Admitting: Radiation Oncology

## 2022-10-11 DIAGNOSIS — C714 Malignant neoplasm of occipital lobe: Secondary | ICD-10-CM | POA: Diagnosis not present

## 2022-10-11 DIAGNOSIS — Z510 Encounter for antineoplastic radiation therapy: Secondary | ICD-10-CM | POA: Diagnosis not present

## 2022-10-11 LAB — RAD ONC ARIA SESSION SUMMARY
Course Elapsed Days: 43
Plan Fractions Treated to Date: 7
Plan Prescribed Dose Per Fraction: 2 Gy
Plan Total Fractions Prescribed: 7
Plan Total Prescribed Dose: 14 Gy
Reference Point Dosage Given to Date: 14 Gy
Reference Point Session Dosage Given: 2 Gy
Session Number: 30

## 2022-10-12 NOTE — Radiation Completion Notes (Signed)
Patient Name: Donna Barber, Donna Barber MRN: [id number] Date of Birth: 04/24/1950 Referring Physician: Marlis Edelson, M.D. Date of Service: 2022-10-12 Radiation Oncologist: Lonie Peak, M.D. 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 Intent: Curative     ==========DELIVERED PLANS==========  First Treatment Date: 2022-08-29 - Last Treatment Date: 2022-10-11   Plan Name: Brain_R_Occip Site: Occipital Lobe Technique: IMRT Mode: Photon Dose Per Fraction: 2 Gy Prescribed Dose (Delivered / Prescribed): 46 Gy / 46 Gy Prescribed Fxs (Delivered / Prescribed): 23 / 23   Plan Name: Brain_R_Bst Site: Occipital Lobe Technique: IMRT Mode: Photon Dose Per Fraction: 2 Gy Prescribed Dose (Delivered / Prescribed): 14 Gy / 14 Gy Prescribed Fxs (Delivered / Prescribed): 7 / 7     ==========ON TREATMENT VISIT DATES========== 2022-08-29, 2022-09-05, 2022-09-13, 2022-09-21, 2022-10-03, 2022-10-10     ==========UPCOMING VISITS==========       ==========APPENDIX - ON TREATMENT VISIT NOTES==========   See weekly On Treatment Notes in Epic for details.

## 2022-10-13 DIAGNOSIS — M25512 Pain in left shoulder: Secondary | ICD-10-CM | POA: Diagnosis not present

## 2022-10-24 ENCOUNTER — Telehealth: Payer: Self-pay | Admitting: *Deleted

## 2022-10-24 DIAGNOSIS — M79642 Pain in left hand: Secondary | ICD-10-CM | POA: Diagnosis not present

## 2022-10-24 NOTE — Telephone Encounter (Signed)
Daughter called and left voicemail that patient saw a hand specialist and they prescribed a steroid.  Communicated this new information to Dr Barbaraann Cao and he is agreeable to patient dosing that medication.    Relayed this to the patient directly.

## 2022-10-25 DIAGNOSIS — M25642 Stiffness of left hand, not elsewhere classified: Secondary | ICD-10-CM | POA: Diagnosis not present

## 2022-10-28 NOTE — Progress Notes (Signed)
Donna Barber was called today for follow up after completing radiation to her brain.She completed treatment on 10-11-22.   Recent neurologic symptoms, if any:  Seizures: no Headaches: no Nausea: no Dizziness/ataxia: yes, when she stands up too fast Difficulty with hand coordination: yes, Pt has left side neglect from CVA- she reports this is improving but she did drop a plate the other day Focal numbness/weakness: yes, she continues to have numbness and weakness on left side due to CVA Visual deficits/changes: pt has no peripheral vision on left side. This has not improved since treatment.  Confusion/Memory deficits: yes, she reports her husband states she is doing well but she still feels like she struggles at times. She states she cannot remember recipes and has to look them up.   Other issues of note: Pt came to clinic thinking this was an in person visit. Rn was able to talk to pt on the phone. She does have several questions she will call back RN about when she can go home and write them down. Rn will get these questions to Dr. Basilio Cairo if RN cannot answer them when she calls back.

## 2022-11-02 DIAGNOSIS — M6281 Muscle weakness (generalized): Secondary | ICD-10-CM | POA: Diagnosis not present

## 2022-11-03 ENCOUNTER — Ambulatory Visit (HOSPITAL_COMMUNITY)
Admission: RE | Admit: 2022-11-03 | Discharge: 2022-11-03 | Disposition: A | Discharge status: Home / Self Care | Payer: Medicare HMO | Source: Ambulatory Visit | Attending: Internal Medicine | Admitting: Internal Medicine

## 2022-11-03 DIAGNOSIS — C719 Malignant neoplasm of brain, unspecified: Secondary | ICD-10-CM

## 2022-11-03 DIAGNOSIS — C729 Malignant neoplasm of central nervous system, unspecified: Secondary | ICD-10-CM | POA: Diagnosis not present

## 2022-11-03 MED ORDER — GADOBUTROL 1 MMOL/ML IV SOLN
5.0000 mL | Freq: Once | INTRAVENOUS | Status: AC | PRN
  Administered 2022-11-03: 5 mL via INTRAVENOUS

## 2022-11-07 DIAGNOSIS — C714 Malignant neoplasm of occipital lobe: Secondary | ICD-10-CM | POA: Diagnosis not present

## 2022-11-08 ENCOUNTER — Other Ambulatory Visit: Payer: Self-pay

## 2022-11-08 ENCOUNTER — Inpatient Hospital Stay: Payer: Medicare HMO | Admitting: Internal Medicine

## 2022-11-08 ENCOUNTER — Inpatient Hospital Stay: Payer: Medicare HMO | Attending: Internal Medicine

## 2022-11-08 VITALS — BP 152/77 | HR 90 | Temp 99.7°F | Resp 18 | Ht 65.0 in | Wt 111.3 lb

## 2022-11-08 DIAGNOSIS — C713 Malignant neoplasm of parietal lobe: Secondary | ICD-10-CM | POA: Diagnosis not present

## 2022-11-08 DIAGNOSIS — C719 Malignant neoplasm of brain, unspecified: Secondary | ICD-10-CM | POA: Diagnosis not present

## 2022-11-08 DIAGNOSIS — Z79899 Other long term (current) drug therapy: Secondary | ICD-10-CM | POA: Insufficient documentation

## 2022-11-08 LAB — CMP (CANCER CENTER ONLY)
ALT: 12 U/L (ref 0–44)
AST: 14 U/L — ABNORMAL LOW (ref 15–41)
Albumin: 4.5 g/dL (ref 3.5–5.0)
Alkaline Phosphatase: 61 U/L (ref 38–126)
Anion gap: 7 (ref 5–15)
BUN: 9 mg/dL (ref 8–23)
CO2: 26 mmol/L (ref 22–32)
Calcium: 9.9 mg/dL (ref 8.9–10.3)
Chloride: 104 mmol/L (ref 98–111)
Creatinine: 0.52 mg/dL (ref 0.44–1.00)
GFR, Estimated: >60 mL/min (ref >60)
Glucose, Bld: 105 mg/dL — ABNORMAL HIGH (ref 70–99)
Potassium: 4 mmol/L (ref 3.5–5.1)
Sodium: 137 mmol/L (ref 135–145)
Total Bilirubin: 0.8 mg/dL (ref 0.3–1.2)
Total Protein: 6.6 g/dL (ref 6.5–8.1)

## 2022-11-08 LAB — CBC WITH DIFFERENTIAL (CANCER CENTER ONLY)
Abs Immature Granulocytes: 0.01 10*3/uL (ref 0.00–0.07)
Basophils Absolute: 0.1 10*3/uL (ref 0.0–0.1)
Basophils Relative: 1 %
Eosinophils Absolute: 0 10*3/uL (ref 0.0–0.5)
Eosinophils Relative: 0 %
HCT: 41.1 % (ref 36.0–46.0)
Hemoglobin: 13.7 g/dL (ref 12.0–15.0)
Immature Granulocytes: 0 %
Lymphocytes Relative: 7 %
Lymphs Abs: 0.5 10*3/uL — ABNORMAL LOW (ref 0.7–4.0)
MCH: 27.7 pg (ref 26.0–34.0)
MCHC: 33.3 g/dL (ref 30.0–36.0)
MCV: 83.2 fL (ref 80.0–100.0)
Monocytes Absolute: 0.6 10*3/uL (ref 0.1–1.0)
Monocytes Relative: 8 %
Neutro Abs: 6.3 10*3/uL (ref 1.7–7.7)
Neutrophils Relative %: 84 %
Platelet Count: 220 10*3/uL (ref 150–400)
RBC: 4.94 MIL/uL (ref 3.87–5.11)
RDW: 14.8 % (ref 11.5–15.5)
WBC Count: 7.5 10*3/uL (ref 4.0–10.5)
nRBC: 0 % (ref 0.0–0.2)

## 2022-11-08 MED ORDER — TEMOZOLOMIDE 140 MG PO CAPS
140.0000 mg | ORAL_CAPSULE | Freq: Every day | ORAL | 0 refills | Status: DC
Start: 2022-11-08 — End: 2022-12-20

## 2022-11-08 MED ORDER — PROCHLORPERAZINE MALEATE 10 MG PO TABS: 10.0000 mg | ORAL_TABLET | Freq: Four times a day (QID) | ORAL | 2 refills | Status: DC | PRN

## 2022-11-08 MED ORDER — TEMOZOLOMIDE 100 MG PO CAPS
100.0000 mg | ORAL_CAPSULE | Freq: Every day | ORAL | 0 refills | Status: DC
Start: 2022-11-08 — End: 2022-12-20

## 2022-11-08 NOTE — Progress Notes (Signed)
[facility] [address]  [place], [place] [address] [phone]   Interval Evaluation  Date of Service: 11/08/22 Patient Name: Donna Barber Patient MRN: [id number] Patient DOB: 1950-12-21 Provider: Henreitta Leber, MD  Identifying Statement:  Donna Barber is a 72 y.o. female with right parietal glioblastoma    Oncologic History: Oncology History  Glioblastoma, IDH-wildtype (HCC)  07/25/2022 Surgery   Craniotomy, right parietal resection with Dr. Shaune Pollack.  Path demonstrates glioblastoma, IDH wild type.   08/29/2022 -  Chemotherapy   Patient is on Treatment Plan : BRAIN GLIOBLASTOMA Radiation Therapy With Concurrent Temozolomide 75 mg/m2 Daily Followed By Sequential Maintenance Temozolomide x 6-12 cycles       Biomarkers:  MGMT Unknown.  IDH 1/2 Wild type.  EGFR Unknown  TERT Unknown   Interval History: CORINTHIA HELMERS presents for follow up, now having completed full 6 weeks of radiation and Temodar.  Fatigue is notable improved from the end of radiation.  No new deficits.  Underwent bone marrow sampling at [facility] earlier this week.  Otherwise maintains functional indepednence, no worsening of left sided weakness.   H+P (08/16/22) Patient presents to review recent brain tumor diagnosis.  She presented to medical attention in March 2024 with several days history of new onset severe headache.  CNS imaging demonstrated large enhancing mass in right thalamus and occipital lobe.  She underwent craniotomy, resection with Dr. Shaune Pollack on 07/25/22 at [facility]; path demonstrated glioblastoma IDH wild type.  Following surgery, her left arm and leg were weak, sche completed two weeks of rehab and regained most of her prior function.  Right now has impaired vision on the left, and some weakness of the left arm.  Otherwise functionally independent.  Medications: Current Outpatient Medications on File Prior to Visit  Medication Sig Dispense Refill    acetaminophen (TYLENOL) 325 MG tablet Take 650 mg by mouth every 6 (six) hours as needed.     ALPRAZolam (XANAX) 0.25 MG tablet Take 0.25 mg by mouth at bedtime as needed for anxiety.     lacosamide (VIMPAT) 50 MG TABS tablet Take 1 tablet (50 mg total) by mouth 2 (two) times daily. 60 tablet 2   pantoprazole (PROTONIX) 40 MG tablet Take 1 tablet (40 mg total) by mouth 2 (two) times daily. (Patient not taking: Reported on 11/08/2022) 30 tablet 2   prochlorperazine (COMPAZINE) 10 MG tablet TAKE 1 TABLET BY MOUTH EVERY 6 HOURS AS NEEDED FOR NAUSEA OR VOMITING (Patient not taking: Reported on 11/08/2022) 30 tablet 0   temozolomide (TEMODAR) 100 MG capsule Take 1 capsule (100 mg total) by mouth daily. (Take with 20 mg capsules for total daily dose of 120 mg). May take on an empty stomach to decrease nausea & vomiting. (Patient not taking: Reported on 11/08/2022) 42 capsule 0   temozolomide (TEMODAR) 20 MG capsule Take 1 capsule (20 mg total) by mouth daily. (Take with 100 mg capsules for total daily dose of 120 mg).May take on an empty stomach to decrease nausea & vomiting. (Patient not taking: Reported on 11/08/2022) 42 capsule 0   No current facility-administered medications on file prior to visit.    Allergies:  Allergies  Allergen Reactions   Sulfa Antibiotics Nausea And Vomiting   Sulfur Nausea And Vomiting and Nausea Only    Other Reaction(s): GI Intolerance   Codeine Nausea And Vomiting   Elemental Sulfur    Past Medical History:  Past Medical History:  Diagnosis Date  Abnormal Pap smear 2000   Cin-3 treated with LEEP   Canker sores oral    Depression    Dyspareunia    secondary to  atrophy   GERD (gastroesophageal reflux disease)    History of chicken pox    History of measles, mumps, or rubella    Postmenopausal    Uterine fibroid    Vaginal atrophy    Yeast infection    Past Surgical History:  Past Surgical History:  Procedure Laterality Date   CARPAL TUNNEL RELEASE      DILATION AND CURETTAGE OF UTERUS     shoulder sugery     TUBAL LIGATION     bilateral   Social History:  Social History   Socioeconomic History   Marital status: Married    Spouse name: Not on file   Number of children: Not on file   Years of education: Not on file   Highest education level: Not on file  Occupational History   Not on file  Tobacco Use   Smoking status: Never   Smokeless tobacco: Never  Vaping Use   Vaping status: Never Used  Substance and Sexual Activity   Alcohol use: Yes   Drug use: No   Sexual activity: Yes    Birth control/protection: Surgical  Other Topics Concern   Not on file  Social History Narrative   Not on file   Social Determinants of Health   Financial Resource Strain: Low Risk  (01/04/2022)   Received from [facility]   Overall Financial Resource Strain (CARDIA)    Difficulty of Paying Living Expenses: Not hard at all  Food Insecurity: No Food Insecurity (08/16/2022)   Hunger Vital Sign    Worried About Running Out of Food in the Last Year: Never true    Ran Out of Food in the Last Year: Never true  Transportation Needs: No Transportation Needs (08/16/2022)   PRAPARE - Administrator, Civil Service (Medical): No    Lack of Transportation (Non-Medical): No  Physical Activity: Sufficiently Active (01/04/2022)   Received from [facility]   Exercise Vital Sign    Days of Exercise per Week: 6 days    Minutes of Exercise per Session: 80 min  Stress: No Stress Concern Present (01/04/2022)   Received from [facility]ational Health - Occupational Stress Questionnaire    Feeling of Stress : Only a little  Social Connections: Moderately Integrated (01/04/2022)   [facility], [facility]   Social Network    How would you rate your social network (family, work, friends)?: Adequate participation with social networks  Intimate Partner  Violence: Not At Risk (08/16/2022)   Humiliation, Afraid, Rape, and Kick questionnaire    Fear of Current or Ex-Partner: No    Emotionally Abused: No    Physically Abused: No    Sexually Abused: No   Family History:  Family History  Problem Relation Age of Onset   Hypertension Mother    Diabetes Mother    Heart disease Mother    Cancer Brother     Review of Systems: Constitutional: Doesn't report fevers, chills or abnormal weight loss Eyes: Doesn't report blurriness of vision Ears, nose, mouth, throat, and face: Doesn't report sore throat Respiratory: Doesn't report cough, dyspnea or wheezes Cardiovascular: Doesn't report palpitation, chest discomfort  Gastrointestinal:  Doesn't report nausea, constipation, diarrhea GU: Doesn't report incontinence Skin: Doesn't report skin rashes Neurological:  Per HPI Musculoskeletal: Doesn't report joint pain Behavioral/Psych: Doesn't report anxiety  Physical Exam: Vitals:   11/08/22 1111  BP: (!) 152/77  Pulse: 90  Resp: 18  Temp: 99.7 F (37.6 C)  SpO2: 100%   KPS: 80. General: Alert, cooperative, pleasant, in no acute distress Head: Normal EENT: No conjunctival injection or scleral icterus.  Lungs: Resp effort normal Cardiac: Regular rate Abdomen: Non-distended abdomen Skin: No rashes cyanosis or petechiae. Extremities: No clubbing or edema  Neurologic Exam: Mental Status: Awake, alert, attentive to examiner. Oriented to self and environment. Language is fluent with intact comprehension.  Cranial Nerves: Visual acuity is grossly normal. Left hemianopia noted. Extra-ocular movements intact. No ptosis. Face is symmetric Motor: Tone and bulk are normal. Power is 4/5 in left arm, 4+/5 in left leg. Reflexes are symmetric, no pathologic reflexes present.  Sensory: Intact to light touch Gait: Hemiparetic   Labs: I have reviewed the data as listed    Component Value Date/Time   NA 136 10/03/2022 1013   K 4.2 10/03/2022 1013    CL 99 10/03/2022 1013   CO2 29 10/03/2022 1013   GLUCOSE 107 (H) 10/03/2022 1013   BUN 10 10/03/2022 1013   CREATININE 0.67 10/03/2022 1013   CALCIUM 9.8 10/03/2022 1013   PROT 6.2 (L) 10/03/2022 1013   ALBUMIN 4.2 10/03/2022 1013   AST 17 10/03/2022 1013   ALT 9 10/03/2022 1013   ALKPHOS 68 10/03/2022 1013   BILITOT 0.7 10/03/2022 1013   GFRNONAA >60 10/03/2022 1013   Lab Results  Component Value Date   WBC 7.5 11/08/2022   NEUTROABS 6.3 11/08/2022   HGB 13.7 11/08/2022   HCT 41.1 11/08/2022   MCV 83.2 11/08/2022   PLT 220 11/08/2022    Imaging:  [facility] Clinician Interpretation: I have personally reviewed the CNS images as listed.  My interpretation, in the context of the patient's clinical presentation, is treatment effect vs true progression pending official read.  No results found.   Assessment/Plan Glioblastoma, IDH-wildtype (HCC)  Norman TYRENA GOHR is clinically stable today, now having completed 6 weeks course of IMRT and Temozolomide.  MRI brain demonstrates progressive changes which could be consistent with treatment effect, pseudoprogression vs organic growth of tumor.    We recommended initiating treatment with Temozolomide 150mg /m2, on for five days and off for twenty three days in twenty eight day cycles. The patient will have a complete blood count performed on days 21 and 28 of each cycle, and a comprehensive metabolic panel performed on day 28 of each cycle. Labs may need to be performed more often. Zofran will prescribed for home use for nausea/vomiting.   Informed consent was obtained verbally at bedside to proceed with oral chemotherapy.  Chemotherapy should be held for the following:  ANC less than 1,000  Platelets less than 100,000  LFT or creatinine greater than 2x ULN  If clinical concerns/contraindications develop  Vimpat will con't 50mg  BID.  We appreciate the opportunity to participate in the care of AKEIRA LAHM.    We ask that SHERRINA ZAUGG return to clinic in 1 months with labs prior to cycle #2, or sooner as needed.  All questions were answered. The patient knows to call the clinic with any problems, questions or concerns. No barriers to learning were detected.  The total time spent in the encounter was 40 minutes and more than 50% was on counseling and review of test results   Henreitta Leber, MD Medical Director of  Neuro-Oncology [facility] 11/08/22 11:13 AM

## 2022-11-10 ENCOUNTER — Encounter: Payer: Self-pay | Admitting: Radiation Oncology

## 2022-11-10 ENCOUNTER — Ambulatory Visit
Admission: RE | Admit: 2022-11-10 | Discharge: 2022-11-10 | Disposition: A | Discharge status: Home / Self Care | Payer: Medicare HMO | Source: Ambulatory Visit | Attending: Radiation Oncology | Admitting: Radiation Oncology

## 2022-11-10 ENCOUNTER — Other Ambulatory Visit: Payer: Self-pay

## 2022-11-10 ENCOUNTER — Telehealth: Payer: Self-pay

## 2022-11-10 DIAGNOSIS — C714 Malignant neoplasm of occipital lobe: Secondary | ICD-10-CM | POA: Diagnosis not present

## 2022-11-10 NOTE — Telephone Encounter (Signed)
Pt called RN back after she arrived to clinic thinking this appointment was an in person visit. Rn was able to speak with pt when she called back. Follow up note was completed and routed to Dr. Basilio Cairo.

## 2022-11-10 NOTE — Telephone Encounter (Signed)
RN attempted to call pt for telephone follow up without success. RN will pt back in a few minutes.

## 2022-11-11 ENCOUNTER — Telehealth: Payer: Self-pay

## 2022-11-11 ENCOUNTER — Encounter: Payer: Self-pay | Admitting: Internal Medicine

## 2022-11-11 MED ORDER — DEXAMETHASONE 4 MG PO TABS: 4.0000 mg | ORAL_TABLET | Freq: Every day | ORAL | 1 refills | Status: DC

## 2022-11-11 NOTE — Telephone Encounter (Signed)
Pt called and left message for RN to call back. Rn called pt and she stated her head did not hurt but felt tight or swollen on the inside. She also also reports feeling more anxious and shaky today. She stated she woke up feeling more "wobbly" but not fallen or stumbled. She reports that when she lays her head on something cool it makes her head feel better as well. RN had pt check her BP and it was 163/91. Pt has not have any other associated symptoms and has not started any new medications. She currently is packing for a beach trip with her family tomorrow and is concerned with her symptoms today. Pt stated to RN she has had this tightness in her head for a couple of days now but did not report it to RN yesterday. She overall feels it is worse today and she is more anxious. Rn will reach out to Dr. Basilio Cairo and seek her feedback.

## 2022-11-11 NOTE — Telephone Encounter (Signed)
RN called pt to inform her that Dr. Barbaraann Cao had called in Dexamethasone for her to help with inflammation. Dr. Barbaraann Cao feels like this inflammation is causing the head tightness she is feeling. Rn instructed pt to take 8mg  today and then take 4mg  daily after that. She stated she would have her husband pick up the prescription and take the medication. She was able to verbalize the instructions (she wrote down as well) to RN. Rn also encouraged her to have a great time with her family at the beach as well. She is looking forward to this trip. Pt was grateful for this phone call. Pt knows to call with any further concerns or questions.

## 2022-11-12 ENCOUNTER — Encounter: Payer: Self-pay | Admitting: Internal Medicine

## 2022-11-14 ENCOUNTER — Other Ambulatory Visit: Payer: Self-pay | Admitting: Internal Medicine

## 2022-11-15 ENCOUNTER — Other Ambulatory Visit: Payer: Self-pay | Admitting: *Deleted

## 2022-11-15 DIAGNOSIS — C719 Malignant neoplasm of brain, unspecified: Secondary | ICD-10-CM

## 2022-11-19 ENCOUNTER — Other Ambulatory Visit: Payer: Self-pay | Admitting: Internal Medicine

## 2022-11-21 ENCOUNTER — Encounter: Payer: Self-pay | Admitting: Internal Medicine

## 2022-11-22 ENCOUNTER — Telehealth: Payer: Self-pay | Admitting: *Deleted

## 2022-11-22 ENCOUNTER — Other Ambulatory Visit: Payer: Self-pay | Admitting: *Deleted

## 2022-11-22 DIAGNOSIS — C719 Malignant neoplasm of brain, unspecified: Secondary | ICD-10-CM

## 2022-11-22 MED ORDER — NAPROXEN 500 MG PO TABS
500.0000 mg | ORAL_TABLET | Freq: Four times a day (QID) | ORAL | 0 refills | Status: AC | PRN
Start: 2022-11-22 — End: ?

## 2022-11-22 NOTE — Telephone Encounter (Signed)
Returned PC to patient, informed her of Dr Liana Gerold recommendation that she try taking naproxen every 6 hours.  Instructed patient to call back after a couple of days if this is not helping & we will bring her in to see Dr Barbaraann Cao.  She verbalizes understanding.

## 2022-11-22 NOTE — Telephone Encounter (Signed)
-----   Message from Henreitta Leber sent at 11/22/2022 10:15 AM EDT ----- Can do Naproxen 500 or 550mg  every 6 hours, if that doesn't help then we can get her in to clinic to review ----- Message ----- From: Arville Care, RN Sent: 11/22/2022  10:13 AM EDT To: Henreitta Leber, MD  Donna Barber just called, she says she continues to have a feeling of pressure in her head despite taking the dex 4 mg daily for the last week.  She said it gets worse as the day goes on.  She denies any nausea or vomiting.

## 2022-11-28 ENCOUNTER — Inpatient Hospital Stay: Payer: Medicare HMO | Attending: Internal Medicine | Admitting: Internal Medicine

## 2022-11-28 ENCOUNTER — Other Ambulatory Visit: Payer: Self-pay

## 2022-11-28 ENCOUNTER — Telehealth: Payer: Self-pay | Admitting: *Deleted

## 2022-11-28 VITALS — BP 166/71 | HR 72 | Temp 97.7°F | Resp 13 | Wt 117.1 lb

## 2022-11-28 DIAGNOSIS — R519 Headache, unspecified: Secondary | ICD-10-CM | POA: Diagnosis not present

## 2022-11-28 DIAGNOSIS — C713 Malignant neoplasm of parietal lobe: Secondary | ICD-10-CM | POA: Diagnosis not present

## 2022-11-28 DIAGNOSIS — R112 Nausea with vomiting, unspecified: Secondary | ICD-10-CM | POA: Diagnosis not present

## 2022-11-28 DIAGNOSIS — C719 Malignant neoplasm of brain, unspecified: Secondary | ICD-10-CM | POA: Diagnosis not present

## 2022-11-28 MED ORDER — TRAZODONE HCL 50 MG PO TABS: 50.0000 mg | ORAL_TABLET | Freq: Every day | ORAL | 2 refills | Status: DC

## 2022-11-28 NOTE — Progress Notes (Signed)
[facility] [address]  [place], [place] [address] [phone]   Interval Evaluation  Date of Service: 11/28/22 Patient Name: Donna Barber Patient MRN: [id number] Patient DOB: 1951-04-15 Provider: Henreitta Leber, MD  Identifying Statement:  Donna Barber is a 72 y.o. female with right parietal glioblastoma    Oncologic History: Oncology History  Glioblastoma, IDH-wildtype (HCC)  07/25/2022 Surgery   Craniotomy, right parietal resection with Dr. Shaune Pollack.  Path demonstrates glioblastoma, IDH wild type.   08/29/2022 -  Chemotherapy   Patient is on Treatment Plan : BRAIN GLIOBLASTOMA Radiation Therapy With Concurrent Temozolomide 75 mg/m2 Daily Followed By Sequential Maintenance Temozolomide x 6-12 cycles       Biomarkers:  MGMT Unknown.  IDH 1/2 Wild type.  EGFR Unknown  TERT Unknown   Interval History: SHANIK KOVALEV presents for follow up.  Temodar cycle 1 was dosed on 8/6 without complication.  Today she complains of daily moderate headache "in both temples".  This has not improved with decadron 4mg  daily or the naprosyn.  Sleep has been very poor, she worries a lot at night about the tumor. Otherwise no new deficits.  Maintains functional indepednence, no worsening of left sided weakness.   H+P (08/16/22) Patient presents to review recent brain tumor diagnosis.  She presented to medical attention in March 2024 with several days history of new onset severe headache.  CNS imaging demonstrated large enhancing mass in right thalamus and occipital lobe.  She underwent craniotomy, resection with Dr. Shaune Pollack on 07/25/22 at [facility]; path demonstrated glioblastoma IDH wild type.  Following surgery, her left arm and leg were weak, sche completed two weeks of rehab and regained most of her prior function.  Right now has impaired vision on the left, and some weakness of the left arm.  Otherwise functionally independent.  Medications: Current  Outpatient Medications on File Prior to Visit  Medication Sig Dispense Refill   acetaminophen (TYLENOL) 325 MG tablet Take 650 mg by mouth every 6 (six) hours as needed.     ALPRAZolam (XANAX) 0.25 MG tablet Take 0.25 mg by mouth at bedtime as needed for anxiety.     dexamethasone (DECADRON) 4 MG tablet Take 1 tablet (4 mg total) by mouth daily. 30 tablet 1   lacosamide (VIMPAT) 50 MG TABS tablet TAKE 1 TABLET BY MOUTH 2 TIMES A DAY 60 tablet 3   naproxen (NAPROSYN) 500 MG tablet Take 1 tablet (500 mg total) by mouth every 6 (six) hours as needed for headache. 30 tablet 0   pantoprazole (PROTONIX) 40 MG tablet Take 1 tablet (40 mg total) by mouth 2 (two) times daily. 30 tablet 2   prochlorperazine (COMPAZINE) 10 MG tablet Take 1 tablet (10 mg total) by mouth every 6 (six) hours as needed. 30 tablet 2   temozolomide (TEMODAR) 100 MG capsule Take 1 capsule (100 mg total) by mouth daily. (Take with 20 mg capsules for total daily dose of 120 mg). May take on an empty stomach to decrease nausea & vomiting. 42 capsule 0   temozolomide (TEMODAR) 100 MG capsule Take 1 capsule (100 mg total) by mouth daily. (Take with 140 mg capsules for total daily dose of 240 mg). May take on an empty stomach to decrease nausea & vomiting. 5 capsule 0   temozolomide (TEMODAR) 140 MG capsule Take 1 capsule (140 mg total) by mouth daily. (Take with 100 mg capsules for total daily dose of 240 mg). May take  on an empty stomach to decrease nausea & vomiting. 5 capsule 0   temozolomide (TEMODAR) 20 MG capsule Take 1 capsule (20 mg total) by mouth daily. (Take with 100 mg capsules for total daily dose of 120 mg).May take on an empty stomach to decrease nausea & vomiting. 42 capsule 0   No current facility-administered medications on file prior to visit.    Allergies:  Allergies  Allergen Reactions   Sulfa Antibiotics Nausea And Vomiting   Sulfur Nausea And Vomiting and Nausea Only    Other Reaction(s): GI Intolerance    Codeine Nausea And Vomiting   Elemental Sulfur    Past Medical History:  Past Medical History:  Diagnosis Date   Abnormal Pap smear 2000   Cin-3 treated with LEEP   Canker sores oral    Depression    Dyspareunia    secondary to  atrophy   GERD (gastroesophageal reflux disease)    History of chicken pox    History of measles, mumps, or rubella    Postmenopausal    Uterine fibroid    Vaginal atrophy    Yeast infection    Past Surgical History:  Past Surgical History:  Procedure Laterality Date   CARPAL TUNNEL RELEASE     DILATION AND CURETTAGE OF UTERUS     shoulder sugery     TUBAL LIGATION     bilateral   Social History:  Social History   Socioeconomic History   Marital status: Married    Spouse name: Not on file   Number of children: Not on file   Years of education: Not on file   Highest education level: Not on file  Occupational History   Not on file  Tobacco Use   Smoking status: Never   Smokeless tobacco: Never  Vaping Use   Vaping status: Never Used  Substance and Sexual Activity   Alcohol use: Yes   Drug use: No   Sexual activity: Yes    Birth control/protection: Surgical  Other Topics Concern   Not on file  Social History Narrative   Not on file   Social Determinants of Health   Financial Resource Strain: Low Risk  (01/04/2022)   [facility]   Overall Financial Resource Strain (CARDIA)    Difficulty of Paying Living Expenses: Not hard at all  Food Insecurity: No Food Insecurity (08/16/2022)   Hunger Vital Sign    Worried About Running Out of Food in the Last Year: Never true    Ran Out of Food in the Last Year: Never true  Transportation Needs: No Transportation Needs (08/16/2022)   PRAPARE - Administrator, Civil Service (Medical): No    Lack of Transportation (Non-Medical): No  Physical Activity: Sufficiently Active (01/04/2022)   [facility]   Exercise Vital Sign    Days of Exercise per Week: 6 days    Minutes of Exercise per Session: 80 min  Stress: No Stress Concern Present (01/04/2022)   Received from [facility], [facility], [facility]   [facility] of Occupational Health - Occupational Stress Questionnaire    Feeling of Stress : Only a little  Social Connections: Moderately Integrated (01/04/2022)   [facility]   Social Network    How would you rate your social network (family, work, friends)?: Adequate participation with social networks  Intimate Partner Violence: Not At Risk (08/16/2022)  Humiliation, Afraid, Rape, and Kick questionnaire    Fear of Current or Ex-Partner: No    Emotionally Abused: No    Physically Abused: No    Sexually Abused: No   Family History:  Family History  Problem Relation Age of Onset   Hypertension Mother    Diabetes Mother    Heart disease Mother    Cancer Brother     Review of Systems: Constitutional: Doesn't report fevers, chills or abnormal weight loss Eyes: Doesn't report blurriness of vision Ears, nose, mouth, throat, and face: Doesn't report sore throat Respiratory: Doesn't report cough, dyspnea or wheezes Cardiovascular: Doesn't report palpitation, chest discomfort  Gastrointestinal:  Doesn't report nausea, constipation, diarrhea GU: Doesn't report incontinence Skin: Doesn't report skin rashes Neurological: Per HPI Musculoskeletal: Doesn't report joint pain Behavioral/Psych: Doesn't report anxiety  Physical Exam: Vitals:   11/28/22 1124  BP: (!) 166/71  Pulse: 72  Resp: 13  Temp: 97.7 F (36.5 C)  SpO2: 100%   KPS: 80. General: Alert, cooperative, pleasant, in no acute distress Head: Normal EENT: No conjunctival injection or scleral icterus.  Lungs: Resp effort normal Cardiac: Regular rate Abdomen: Non-distended abdomen Skin: No rashes cyanosis or petechiae. Extremities: No clubbing or  edema  Neurologic Exam: Mental Status: Awake, alert, attentive to examiner. Oriented to self and environment. Language is fluent with intact comprehension.  Cranial Nerves: Visual acuity is grossly normal. Left hemianopia noted. Extra-ocular movements intact. No ptosis. Face is symmetric Motor: Tone and bulk are normal. Power is 4/5 in left arm, 4+/5 in left leg. Reflexes are symmetric, no pathologic reflexes present.  Sensory: Intact to light touch Gait: Hemiparetic   Labs: I have reviewed the data as listed    Component Value Date/Time   NA 137 11/08/2022 1051   K 4.0 11/08/2022 1051   CL 104 11/08/2022 1051   CO2 26 11/08/2022 1051   GLUCOSE 105 (H) 11/08/2022 1051   BUN 9 11/08/2022 1051   CREATININE 0.52 11/08/2022 1051   CALCIUM 9.9 11/08/2022 1051   PROT 6.6 11/08/2022 1051   ALBUMIN 4.5 11/08/2022 1051   AST 14 (L) 11/08/2022 1051   ALT 12 11/08/2022 1051   ALKPHOS 61 11/08/2022 1051   BILITOT 0.8 11/08/2022 1051   GFRNONAA >60 11/08/2022 1051   Lab Results  Component Value Date   WBC 7.5 11/08/2022   NEUTROABS 6.3 11/08/2022   HGB 13.7 11/08/2022   HCT 41.1 11/08/2022   MCV 83.2 11/08/2022   PLT 220 11/08/2022      Assessment/Plan Glioblastoma, IDH-wildtype (HCC) - Plan: MR BRAIN W WO CONTRAST  Alexanderia W Hermoso presents today having dosed 1st cycle of 5-day Temodar.  She is experiencing chronic daily headache which we suspect is secondary to poor sleep.  We counseled extensively today on sleep hygiene.  Will also initiate trial of Trazodone 50mg  HS as sleep aid.  We recommended continuing treatment with Temozolomide 150mg /m2, on for five days and off for twenty three days in twenty eight day cycles. The patient will have a complete blood count performed on days 21 and 28 of each cycle, and a comprehensive metabolic panel performed on day 28 of each cycle. Labs may need to be performed more often. Zofran will prescribed for home use for nausea/vomiting.    Chemotherapy should be held for the following:  ANC less than 1,000  Platelets less than 100,000  LFT or creatinine greater than 2x ULN  If clinical concerns/contraindications develop  Vimpat will con't 50mg  BID.  We appreciate the opportunity to participate in the care of ALEK LAISURE.    We ask that JENEVA DRAGOVICH return to clinic in 1 months with MRI brain prior to cycle #2, or sooner as needed.  All questions were answered. The patient knows to call the clinic with any problems, questions or concerns. No barriers to learning were detected.  The total time spent in the encounter was 40 minutes and more than 50% was on counseling and review of test results   Henreitta Leber, MD Medical Director of Neuro-Oncology [facility] [place] 11/28/22 4:29 PM

## 2022-11-28 NOTE — Telephone Encounter (Signed)
Daughter states Donna Barber is still having the swelling feeling around her head. Is taking the Naprosyn 2 times a day rather that every 6 hours due to previous stomach issues. She did well after chemo. Is walking every morning,but sleeps a lot during the day. I stressed out about the swelling.   Will come in today to see Dr Barbaraann Cao at [facility]

## 2022-12-05 ENCOUNTER — Telehealth: Payer: Self-pay | Admitting: *Deleted

## 2022-12-05 MED ORDER — FAMOTIDINE 20 MG PO TABS: 20.0000 mg | ORAL_TABLET | Freq: Every day | ORAL | 2 refills | Status: DC

## 2022-12-05 MED ORDER — TRAZODONE HCL 100 MG PO TABS: 100.0000 mg | ORAL_TABLET | Freq: Every day | ORAL | 3 refills | Status: DC

## 2022-12-05 NOTE — Telephone Encounter (Signed)
Patients daughter Marcelino Duster called to provide update on patent (mother).    Returned call to patient to obtain more details and also informed that if patient wishes for [place] to speak with daughter in the future we would need her signed consent to do so.  Patient stated understanding.  Patient states antidepressant it working, more active than prior and doesn't feel like the tumor is going to result in her immediate death less days than prior.  Concerns are that she is still not sleeping well.  Reports going to sleep but wakes around 1am and unable to return to sleep. Patient is dosing Trazodone 50 mg HS.  Also concerned about worsening acid reflux.  Is on Decadron 4 mg once daily and takes Protonix 40 mg BID Plus several tums throughout the day.  She also feels that the reflux is contributing some to the insomnia.  Routing to Dr Barbaraann Cao to please advise of any changes or advice for patient.

## 2022-12-05 NOTE — Addendum Note (Signed)
Addended by: Henreitta Leber on: 12/05/2022 03:29 PM   Modules accepted: Orders

## 2022-12-11 ENCOUNTER — Other Ambulatory Visit: Payer: Self-pay | Admitting: Internal Medicine

## 2022-12-11 DIAGNOSIS — C719 Malignant neoplasm of brain, unspecified: Secondary | ICD-10-CM

## 2022-12-12 NOTE — Telephone Encounter (Signed)
Patient will have labs and see provider before refilling of oral chemo.

## 2022-12-16 ENCOUNTER — Ambulatory Visit (HOSPITAL_COMMUNITY)
Admission: RE | Admit: 2022-12-16 | Discharge: 2022-12-16 | Disposition: A | Discharge status: Home / Self Care | Payer: Medicare HMO | Source: Ambulatory Visit | Attending: Internal Medicine | Admitting: Internal Medicine

## 2022-12-16 DIAGNOSIS — C719 Malignant neoplasm of brain, unspecified: Secondary | ICD-10-CM

## 2022-12-16 MED ORDER — GADOBUTROL 1 MMOL/ML IV SOLN
5.0000 mL | Freq: Once | INTRAVENOUS | Status: AC | PRN
  Administered 2022-12-16: 5 mL via INTRAVENOUS

## 2022-12-20 ENCOUNTER — Inpatient Hospital Stay: Payer: Medicare HMO | Attending: Internal Medicine

## 2022-12-20 ENCOUNTER — Inpatient Hospital Stay (HOSPITAL_BASED_OUTPATIENT_CLINIC_OR_DEPARTMENT_OTHER): Payer: Medicare HMO | Admitting: Internal Medicine

## 2022-12-20 VITALS — BP 187/83 | HR 62 | Temp 98.5°F | Resp 13 | Wt 115.2 lb

## 2022-12-20 DIAGNOSIS — R112 Nausea with vomiting, unspecified: Secondary | ICD-10-CM | POA: Insufficient documentation

## 2022-12-20 DIAGNOSIS — C719 Malignant neoplasm of brain, unspecified: Secondary | ICD-10-CM | POA: Diagnosis not present

## 2022-12-20 DIAGNOSIS — C713 Malignant neoplasm of parietal lobe: Secondary | ICD-10-CM | POA: Diagnosis not present

## 2022-12-20 LAB — CMP (CANCER CENTER ONLY)
ALT: 12 U/L (ref 0–44)
AST: 11 U/L — ABNORMAL LOW (ref 15–41)
Albumin: 4.3 g/dL (ref 3.5–5.0)
Alkaline Phosphatase: 54 U/L (ref 38–126)
Anion gap: 5 (ref 5–15)
BUN: 15 mg/dL (ref 8–23)
CO2: 32 mmol/L (ref 22–32)
Calcium: 9.3 mg/dL (ref 8.9–10.3)
Chloride: 100 mmol/L (ref 98–111)
Creatinine: 0.65 mg/dL (ref 0.44–1.00)
GFR, Estimated: >60 mL/min (ref >60)
Glucose, Bld: 104 mg/dL — ABNORMAL HIGH (ref 70–99)
Potassium: 4.7 mmol/L (ref 3.5–5.1)
Sodium: 137 mmol/L (ref 135–145)
Total Bilirubin: 0.5 mg/dL (ref 0.3–1.2)
Total Protein: 6.4 g/dL — ABNORMAL LOW (ref 6.5–8.1)

## 2022-12-20 LAB — CBC WITH DIFFERENTIAL (CANCER CENTER ONLY)
Abs Immature Granulocytes: 0.09 10*3/uL — ABNORMAL HIGH (ref 0.00–0.07)
Basophils Absolute: 0 10*3/uL (ref 0.0–0.1)
Basophils Relative: 0 %
Eosinophils Absolute: 0 10*3/uL (ref 0.0–0.5)
Eosinophils Relative: 0 %
HCT: 42.7 % (ref 36.0–46.0)
Hemoglobin: 14.1 g/dL (ref 12.0–15.0)
Immature Granulocytes: 1 %
Lymphocytes Relative: 8 %
Lymphs Abs: 0.8 10*3/uL (ref 0.7–4.0)
MCH: 28.1 pg (ref 26.0–34.0)
MCHC: 33 g/dL (ref 30.0–36.0)
MCV: 85.1 fL (ref 80.0–100.0)
Monocytes Absolute: 0.8 10*3/uL (ref 0.1–1.0)
Monocytes Relative: 7 %
Neutro Abs: 8.7 10*3/uL — ABNORMAL HIGH (ref 1.7–7.7)
Neutrophils Relative %: 84 %
Platelet Count: 244 10*3/uL (ref 150–400)
RBC: 5.02 MIL/uL (ref 3.87–5.11)
RDW: 15.1 % (ref 11.5–15.5)
WBC Count: 10.4 10*3/uL (ref 4.0–10.5)
nRBC: 0 % (ref 0.0–0.2)

## 2022-12-20 MED ORDER — TEMOZOLOMIDE 100 MG PO CAPS
200.0000 mg/m2/d | ORAL_CAPSULE | Freq: Every day | ORAL | 0 refills | Status: DC
Start: 2022-12-20 — End: 2023-04-17

## 2022-12-20 MED ORDER — DEXAMETHASONE 1 MG PO TABS: ORAL_TABLET | ORAL | 0 refills | Status: AC

## 2022-12-20 NOTE — Progress Notes (Signed)
[facility] [address]  [place], [place] [address] [phone]   Interval Evaluation  Date of Service: 12/20/22 Patient Name: Donna Barber Patient MRN: [id number] Patient DOB: 13-Aug-1950 Provider: Henreitta Leber, MD  Identifying Statement:  MASIYA BABICZ is a 72 y.o. female with right parietal glioblastoma    Oncologic History: Oncology History  Glioblastoma, IDH-wildtype (HCC)  07/25/2022 Surgery   Craniotomy, right parietal resection with Dr. Shaune Pollack.  Path demonstrates glioblastoma, IDH wild type.   08/29/2022 -  Chemotherapy   Patient is on Treatment Plan : BRAIN GLIOBLASTOMA Radiation Therapy With Concurrent Temozolomide 75 mg/m2 Daily Followed By Sequential Maintenance Temozolomide x 6-12 cycles       Biomarkers:  MGMT Unknown.  IDH 1/2 Wild type.  EGFR Unknown  TERT Unknown   Interval History: SHEMIAH DEERY presents for follow up, now having completed cycle #1 5-day Temodar.  Continues on decadron 4mg  daily.  Sleep has improved with the trazodone, as have the headaches.  Otherwise no new deficits.  Maintains functional independence, no worsening of left sided weakness.   H+P (08/16/22) Patient presents to review recent brain tumor diagnosis.  She presented to medical attention in March 2024 with several days history of new onset severe headache.  CNS imaging demonstrated large enhancing mass in right thalamus and occipital lobe.  She underwent craniotomy, resection with Dr. Shaune Pollack on 07/25/22 at [facility] path demonstrated glioblastoma IDH wild type.  Following surgery, her left arm and leg were weak, sche completed two weeks of rehab and regained most of her prior function.  Right now has impaired vision on the left, and some weakness of the left arm.  Otherwise functionally independent.  Medications: Current Outpatient Medications on File Prior to Visit  Medication Sig Dispense Refill   acetaminophen (TYLENOL) 325 MG tablet  Take 650 mg by mouth every 6 (six) hours as needed.     ALPRAZolam (XANAX) 0.25 MG tablet Take 0.25 mg by mouth at bedtime as needed for anxiety.     dexamethasone (DECADRON) 4 MG tablet Take 1 tablet (4 mg total) by mouth daily. 30 tablet 1   famotidine (PEPCID) 20 MG tablet Take 1 tablet (20 mg total) by mouth at bedtime. 30 tablet 2   lacosamide (VIMPAT) 50 MG TABS tablet TAKE 1 TABLET BY MOUTH 2 TIMES A DAY 60 tablet 3   naproxen (NAPROSYN) 500 MG tablet Take 1 tablet (500 mg total) by mouth every 6 (six) hours as needed for headache. 30 tablet 0   pantoprazole (PROTONIX) 40 MG tablet Take 1 tablet (40 mg total) by mouth 2 (two) times daily. 30 tablet 2   prochlorperazine (COMPAZINE) 10 MG tablet Take 1 tablet (10 mg total) by mouth every 6 (six) hours as needed. 30 tablet 2   temozolomide (TEMODAR) 100 MG capsule Take 1 capsule (100 mg total) by mouth daily. (Take with 20 mg capsules for total daily dose of 120 mg). May take on an empty stomach to decrease nausea & vomiting. 42 capsule 0   temozolomide (TEMODAR) 100 MG capsule Take 1 capsule (100 mg total) by mouth daily. (Take with 140 mg capsules for total daily dose of 240 mg). May take on an empty stomach to decrease nausea & vomiting. 5 capsule 0   temozolomide (TEMODAR) 140 MG capsule Take 1 capsule (140 mg total) by mouth daily. (Take with 100 mg capsules for total daily dose of 240 mg). May take on an empty  stomach to decrease nausea & vomiting. 5 capsule 0   temozolomide (TEMODAR) 20 MG capsule Take 1 capsule (20 mg total) by mouth daily. (Take with 100 mg capsules for total daily dose of 120 mg).May take on an empty stomach to decrease nausea & vomiting. 42 capsule 0   traZODone (DESYREL) 100 MG tablet Take 1 tablet (100 mg total) by mouth at bedtime. 30 tablet 3   No current facility-administered medications on file prior to visit.    Allergies:  Allergies  Allergen Reactions   Sulfa Antibiotics Nausea And Vomiting   Sulfur  Nausea And Vomiting and Nausea Only    Other Reaction(s): GI Intolerance   Codeine Nausea And Vomiting   Elemental Sulfur    Past Medical History:  Past Medical History:  Diagnosis Date   Abnormal Pap smear 2000   Cin-3 treated with LEEP   Canker sores oral    Depression    Dyspareunia    secondary to  atrophy   GERD (gastroesophageal reflux disease)    History of chicken pox    History of measles, mumps, or rubella    Postmenopausal    Uterine fibroid    Vaginal atrophy    Yeast infection    Past Surgical History:  Past Surgical History:  Procedure Laterality Date   CARPAL TUNNEL RELEASE     DILATION AND CURETTAGE OF UTERUS     shoulder sugery     TUBAL LIGATION     bilateral   Social History:  Social History   Socioeconomic History   Marital status: Married    Spouse name: Not on file   Number of children: Not on file   Years of education: Not on file   Highest education level: Not on file  Occupational History   Not on file  Tobacco Use   Smoking status: Never   Smokeless tobacco: Never  Vaping Use   Vaping status: Never Used  Substance and Sexual Activity   Alcohol use: Yes   Drug use: No   Sexual activity: Yes    Birth control/protection: Surgical  Other Topics Concern   Not on file  Social History Narrative   Not on file   Social Determinants of Health   Financial Resource Strain: Low Risk  (01/04/2022)   [facility]   Overall Financial Resource Strain (CARDIA)    Difficulty of Paying Living Expenses: Not hard at all  Food Insecurity: No Food Insecurity (08/16/2022)   Hunger Vital Sign    Worried About Running Out of Food in the Last Year: Never true    Ran Out of Food in the Last Year: Never true  Transportation Needs: No Transportation Needs (08/16/2022)   PRAPARE - Administrator, Civil Service (Medical): No    Lack of Transportation (Non-Medical): No  Physical Activity: Sufficiently  Active (01/04/2022)   [facility]   Exercise Vital Sign    Days of Exercise per Week: 6 days    Minutes of Exercise per Session: 80 min  Stress: No Stress Concern Present (01/04/2022)   Received from [facility], [facility], [facility]   [facility] of Occupational Health - Occupational Stress Questionnaire    Feeling of Stress : Only a little  Social Connections: Moderately Integrated (01/04/2022)   [facility]   Social Network    How would you rate your social network (family,  work, friends)?: Adequate participation with social networks  Intimate Partner Violence: Not At Risk (08/16/2022)   Humiliation, Afraid, Rape, and Kick questionnaire    Fear of Current or Ex-Partner: No    Emotionally Abused: No    Physically Abused: No    Sexually Abused: No   Family History:  Family History  Problem Relation Age of Onset   Hypertension Mother    Diabetes Mother    Heart disease Mother    Cancer Brother     Review of Systems: Constitutional: Doesn't report fevers, chills or abnormal weight loss Eyes: Doesn't report blurriness of vision Ears, nose, mouth, throat, and face: Doesn't report sore throat Respiratory: Doesn't report cough, dyspnea or wheezes Cardiovascular: Doesn't report palpitation, chest discomfort  Gastrointestinal:  Doesn't report nausea, constipation, diarrhea GU: Doesn't report incontinence Skin: Doesn't report skin rashes Neurological: Per HPI Musculoskeletal: Doesn't report joint pain Behavioral/Psych: Doesn't report anxiety  Physical Exam: Vitals:   12/20/22 0925  BP: (!) 187/83  Pulse: 62  Resp: 13  Temp: 98.5 F (36.9 C)  SpO2: 100%    KPS: 80. General: Alert, cooperative, pleasant, in no acute distress Head: Normal EENT: No conjunctival injection or scleral icterus.  Lungs: Resp effort normal Cardiac: Regular rate Abdomen: Non-distended  abdomen Skin: No rashes cyanosis or petechiae. Extremities: No clubbing or edema  Neurologic Exam: Mental Status: Awake, alert, attentive to examiner. Oriented to self and environment. Language is fluent with intact comprehension.  Cranial Nerves: Visual acuity is grossly normal. Left hemianopia noted. Extra-ocular movements intact. No ptosis. Face is symmetric Motor: Tone and bulk are normal. Power is 4/5 in left arm, 4+/5 in left leg. Reflexes are symmetric, no pathologic reflexes present.  Sensory: Intact to light touch Gait: Hemiparetic   Labs: I have reviewed the data as listed    Component Value Date/Time   NA 137 11/08/2022 1051   K 4.0 11/08/2022 1051   CL 104 11/08/2022 1051   CO2 26 11/08/2022 1051   GLUCOSE 105 (H) 11/08/2022 1051   BUN 9 11/08/2022 1051   CREATININE 0.52 11/08/2022 1051   CALCIUM 9.9 11/08/2022 1051   PROT 6.6 11/08/2022 1051   ALBUMIN 4.5 11/08/2022 1051   AST 14 (L) 11/08/2022 1051   ALT 12 11/08/2022 1051   ALKPHOS 61 11/08/2022 1051   BILITOT 0.8 11/08/2022 1051   GFRNONAA >60 11/08/2022 1051   Lab Results  Component Value Date   WBC 10.4 12/20/2022   NEUTROABS 8.7 (H) 12/20/2022   HGB 14.1 12/20/2022   HCT 42.7 12/20/2022   MCV 85.1 12/20/2022   PLT 244 12/20/2022    Imaging:  [facility] Clinician Interpretation: I have personally reviewed the CNS images as listed.  My interpretation, in the context of the patient's clinical presentation, is stable disease pending official read  No results found.   Assessment/Plan Glioblastoma, IDH-wildtype (HCC)  Donnajean TELA LANDERO presents today having dosed cycle #1 of 5-day Temodar.  MRI brain demonstrates no further progression of changes seen on post-RT MRI, consistent with suspected pseudo-progression.  We recommended continuing treatment with cycle #2 Temozolomide 200mg /m2, on for five days and off for twenty three days in twenty eight day cycles. The patient will have a complete blood count  performed on days 21 and 28 of each cycle, and a comprehensive metabolic panel performed on day 28 of each cycle. Labs may need to be performed more often. Zofran will prescribed for home use for nausea/vomiting.   Chemotherapy should be held for the  following:  ANC less than 1,000  Platelets less than 100,000  LFT or creatinine greater than 2x ULN  If clinical concerns/contraindications develop  Vimpat will con't 50mg  BID.  Decadron should decrease to 2mg  daily x7 days, then 1mg  daily x7 days, then stop.  Headaches are improved compared to prior.  Ok to continue Trazodone 100mg  HS as sleep aid.  We appreciate the opportunity to participate in the care of ELAINAH KOSTYK.    We ask that RAYDENE MORNEAU return to clinic in 1 months with labs prior to cycle #3, or sooner as needed.  All questions were answered. The patient knows to call the clinic with any problems, questions or concerns. No barriers to learning were detected.  The total time spent in the encounter was 40 minutes and more than 50% was on counseling and review of test results   Henreitta Leber, MD Medical Director of Neuro-Oncology [facility] at [facility] 12/20/22 9:15 AM

## 2022-12-24 ENCOUNTER — Other Ambulatory Visit: Payer: Self-pay

## 2022-12-26 ENCOUNTER — Other Ambulatory Visit: Payer: Self-pay | Admitting: Internal Medicine

## 2022-12-26 DIAGNOSIS — C719 Malignant neoplasm of brain, unspecified: Secondary | ICD-10-CM

## 2022-12-26 MED ORDER — TEMOZOLOMIDE 100 MG PO CAPS
200.0000 mg/m2/d | ORAL_CAPSULE | Freq: Every day | ORAL | 0 refills | Status: DC
Start: 2022-12-26 — End: 2023-04-17

## 2022-12-31 ENCOUNTER — Other Ambulatory Visit: Payer: Self-pay | Admitting: Internal Medicine

## 2023-01-02 ENCOUNTER — Encounter: Payer: Self-pay | Admitting: Internal Medicine

## 2023-01-03 ENCOUNTER — Other Ambulatory Visit: Payer: Self-pay

## 2023-01-03 ENCOUNTER — Telehealth: Payer: Self-pay | Admitting: *Deleted

## 2023-01-03 NOTE — Telephone Encounter (Signed)
PC received from patient's husband, Donna Barber - he states patient started her temodar last night & became sick & vomited during the night around 0130.  He is asking if it is ok for her to take her prescribed nausea medication (compazine) while taking the temodar.  Informed husband it is fine for her to take the compazine, she can try taking it 45 minutes to an hour before taking the temodar to see if this helps.  Instructed Bruce to contact this office if she continues to have issues with nausea & vomiting.  He verbalizes understanding.

## 2023-01-09 ENCOUNTER — Telehealth: Payer: Self-pay | Admitting: *Deleted

## 2023-01-09 DIAGNOSIS — C719 Malignant neoplasm of brain, unspecified: Secondary | ICD-10-CM

## 2023-01-09 MED ORDER — PROCHLORPERAZINE MALEATE 10 MG PO TABS: 10.0000 mg | ORAL_TABLET | Freq: Four times a day (QID) | ORAL | 2 refills | Status: DC | PRN

## 2023-01-09 MED ORDER — ONDANSETRON HCL 8 MG PO TABS: 8.0000 mg | ORAL_TABLET | Freq: Three times a day (TID) | ORAL | 1 refills | Status: DC | PRN

## 2023-01-09 NOTE — Telephone Encounter (Signed)
Per Dr. Barbaraann Cao - Continue increasing food and fluids as tolerated and drink 2 protein shakes daily if tolerated. Rest as needed. Fatigue should be improved 3-5 days post treatment. He prescribed compazine as well to use for nausea. Advised her she can alternate meds, taking 4-6 hours apart if needed for severe nausea. Cautioned her that compazine can cause drowsiness.  Contacted patient with all information from  Dr. Barbaraann Cao.  Encouraged her to contact office if nausea become worsens or does not subside. She verbalized understanding of all information.

## 2023-01-09 NOTE — Telephone Encounter (Signed)
Daughter Donna Barber called office, LVM on patient's behalf with concerns r/t on going nausea following chemo infusion on Friday and mother's weight loss. Also requested refill of ondansetron for patient.  Contacted patient to follow up.  Donna Barber stated she is very tired and has been sleeping a lot since she had chemo on Friday. Reassured her that this is not unusual in the first 3-5 days, but should improve.   Patient states persistent nausea since chemo on Friday. Taking Zofran 8 mg every 8 hours as ordered. Still has some meds left, but requested refill at this time.  Tolerated liquids only until this am when she ate a small amount of eggs. Drinks 1 "organic" type Ensure drink daily. Encouraged her to drink 2 daily,, increase other fluids and increase food as tolerated.   Patient/spouse concerned about weight loss since beginning of September. Weight 103 on Friday. Was 106 lb this am. Noted weight in chart of 115 lb on 12/20/22 and patient/spouse verified that was correct.

## 2023-01-10 ENCOUNTER — Emergency Department (HOSPITAL_COMMUNITY): Payer: Medicare HMO

## 2023-01-10 ENCOUNTER — Encounter (HOSPITAL_COMMUNITY): Payer: Self-pay

## 2023-01-10 ENCOUNTER — Emergency Department (HOSPITAL_COMMUNITY)
Admission: EM | Admit: 2023-01-10 | Discharge: 2023-01-10 | Disposition: A | Discharge status: Home / Self Care | Payer: Medicare HMO | Attending: Emergency Medicine | Admitting: Emergency Medicine

## 2023-01-10 DIAGNOSIS — I959 Hypotension, unspecified: Secondary | ICD-10-CM | POA: Diagnosis not present

## 2023-01-10 DIAGNOSIS — R410 Disorientation, unspecified: Secondary | ICD-10-CM | POA: Diagnosis not present

## 2023-01-10 DIAGNOSIS — Z743 Need for continuous supervision: Secondary | ICD-10-CM | POA: Diagnosis not present

## 2023-01-10 DIAGNOSIS — R0902 Hypoxemia: Secondary | ICD-10-CM | POA: Diagnosis not present

## 2023-01-10 DIAGNOSIS — E876 Hypokalemia: Secondary | ICD-10-CM | POA: Diagnosis not present

## 2023-01-10 DIAGNOSIS — R55 Syncope and collapse: Secondary | ICD-10-CM | POA: Insufficient documentation

## 2023-01-10 LAB — CBC WITH DIFFERENTIAL/PLATELET
Abs Immature Granulocytes: 0.04 10*3/uL (ref 0.00–0.07)
Basophils Absolute: 0 10*3/uL (ref 0.0–0.1)
Basophils Relative: 1 %
Eosinophils Absolute: 0.1 10*3/uL (ref 0.0–0.5)
Eosinophils Relative: 1 %
HCT: 41.4 % (ref 36.0–46.0)
Hemoglobin: 13.8 g/dL (ref 12.0–15.0)
Immature Granulocytes: 1 %
Lymphocytes Relative: 4 %
Lymphs Abs: 0.3 10*3/uL — ABNORMAL LOW (ref 0.7–4.0)
MCH: 27.9 pg (ref 26.0–34.0)
MCHC: 33.3 g/dL (ref 30.0–36.0)
MCV: 83.6 fL (ref 80.0–100.0)
Monocytes Absolute: 0.7 10*3/uL (ref 0.1–1.0)
Monocytes Relative: 10 %
Neutro Abs: 5.4 10*3/uL (ref 1.7–7.7)
Neutrophils Relative %: 83 %
Platelets: 211 10*3/uL (ref 150–400)
RBC: 4.95 MIL/uL (ref 3.87–5.11)
RDW: 15 % (ref 11.5–15.5)
WBC: 6.4 10*3/uL (ref 4.0–10.5)
nRBC: 0 % (ref 0.0–0.2)

## 2023-01-10 LAB — COMPREHENSIVE METABOLIC PANEL
ALT: 16 U/L (ref 0–44)
AST: 17 U/L (ref 15–41)
Albumin: 3.9 g/dL (ref 3.5–5.0)
Alkaline Phosphatase: 52 U/L (ref 38–126)
Anion gap: 11 (ref 5–15)
BUN: 9 mg/dL (ref 8–23)
CO2: 23 mmol/L (ref 22–32)
Calcium: 8.8 mg/dL — ABNORMAL LOW (ref 8.9–10.3)
Chloride: 97 mmol/L — ABNORMAL LOW (ref 98–111)
Creatinine, Ser: 0.55 mg/dL (ref 0.44–1.00)
GFR, Estimated: >60 mL/min (ref >60)
Glucose, Bld: 114 mg/dL — ABNORMAL HIGH (ref 70–99)
Potassium: 3 mmol/L — ABNORMAL LOW (ref 3.5–5.1)
Sodium: 131 mmol/L — ABNORMAL LOW (ref 135–145)
Total Bilirubin: 1.4 mg/dL — ABNORMAL HIGH (ref 0.3–1.2)
Total Protein: 6.6 g/dL (ref 6.5–8.1)

## 2023-01-10 LAB — CBG MONITORING, ED: Glucose-Capillary: 100 mg/dL — ABNORMAL HIGH (ref 70–99)

## 2023-01-10 MED ORDER — SODIUM CHLORIDE 0.9 % IV SOLN: INTRAVENOUS | Status: DC

## 2023-01-10 MED ORDER — POTASSIUM CHLORIDE IN NACL 20-0.9 MEQ/L-% IV SOLN
Freq: Once | INTRAVENOUS | Status: AC
  Filled 2023-01-10: qty 1000

## 2023-01-10 MED ORDER — LACOSAMIDE 50 MG PO TABS
50.0000 mg | ORAL_TABLET | Freq: Two times a day (BID) | ORAL | Status: DC
  Administered 2023-01-10: 50 mg via ORAL
  Filled 2023-01-10: qty 1

## 2023-01-10 NOTE — Discharge Instructions (Signed)
Today's evaluation has been generally reassuring, though there is some evidence for mild dehydration and electrolyte abnormalities.  It is important to monitor your condition carefully and do not hesitate to return here.  Otherwise, please be sure to follow-up with your oncology and primary care teams.

## 2023-01-10 NOTE — ED Provider Notes (Signed)
[facility] AT [facility] Provider Note   CSN: [id number] Arrival date & time: 01/10/23  4098     History  Chief Complaint  Patient presents with   Loss of Consciousness    Donna Barber is a 72 y.o. female.  HPI Adult female presents with 2 family members who assists with the history.  She presents after an episode of lightheadedness versus syncope.  She was using the bathroom when she felt lightheaded, flushed, without focal pain.  She had a few moments of minimal interactivity versus complete syncope, no true trauma.  Currently she feels back to baseline.  Context is notable for ongoing chemotherapy for brain cancer, including last session 3 days ago.    Home Medications Prior to Admission medications   Medication Sig Start Date End Date Taking? Authorizing Provider  acetaminophen (TYLENOL) 325 MG tablet Take 650 mg by mouth every 6 (six) hours as needed.    [provider]  ALPRAZolam Prudy Feeler) 0.25 MG tablet Take 0.25 mg by mouth at bedtime as needed for anxiety.    [provider]  dexamethasone (DECADRON) 4 MG tablet Take 1 tablet (4 mg total) by mouth daily. 11/11/22   Henreitta Leber, MD  famotidine (PEPCID) 20 MG tablet Take 1 tablet (20 mg total) by mouth at bedtime. 12/05/22   Henreitta Leber, MD  lacosamide (VIMPAT) 50 MG TABS tablet TAKE 1 TABLET BY MOUTH 2 TIMES A DAY 11/21/22   Vaslow, Georgeanna Lea, MD  naproxen (NAPROSYN) 500 MG tablet Take 1 tablet (500 mg total) by mouth every 6 (six) hours as needed for headache. 11/22/22   Vaslow, Georgeanna Lea, MD  ondansetron (ZOFRAN) 8 MG tablet Take 1 tablet (8 mg total) by mouth every 8 (eight) hours as needed for nausea or vomiting. 01/09/23   Henreitta Leber, MD  pantoprazole (PROTONIX) 40 MG tablet TAKE 1 TABLET BY MOUTH 2 TIMES A DAY 01/02/23   Vaslow, Georgeanna Lea, MD  prochlorperazine (COMPAZINE) 10 MG tablet Take 1 tablet (10 mg total) by mouth every 6 (six) hours as needed.  01/09/23   Henreitta Leber, MD  temozolomide (TEMODAR) 100 MG capsule Take 3 capsules (300 mg total) by mouth daily. (Take with 100 mg capsules for total daily dose of 240 mg). May take on an empty stomach to decrease nausea & vomiting. 12/20/22   Henreitta Leber, MD  temozolomide (TEMODAR) 100 MG capsule Take 3 capsules (300 mg total) by mouth daily. (Take 3x100 mg capsules for total daily dose of 300 mg). May take on an empty stomach to decrease nausea & vomiting. 12/26/22   Henreitta Leber, MD  traZODone (DESYREL) 100 MG tablet Take 1 tablet (100 mg total) by mouth at bedtime. 12/05/22   Henreitta Leber, MD      Allergies    Sulfa antibiotics, Sulfur, Codeine, and Elemental sulfur    Review of Systems   Review of Systems  All other systems reviewed and are negative.   Physical Exam Updated Vital Signs BP 126/64   Pulse 75   Temp 99.1 F (37.3 C) (Oral)   Resp 13   SpO2 98%  Physical Exam Vitals and nursing note reviewed.  Constitutional:      General: She is not in acute distress.    Appearance: She is well-developed. She is ill-appearing. She is not toxic-appearing or diaphoretic.  HENT:     Head: Atraumatic.  Eyes:     Conjunctiva/sclera: Conjunctivae normal.  Cardiovascular:     Rate and Rhythm: Normal rate and regular rhythm.  Pulmonary:     Effort: Pulmonary effort is normal. No respiratory distress.     Breath sounds: Normal breath sounds. No stridor.  Abdominal:     General: There is no distension.  Skin:    General: Skin is warm and dry.  Neurological:     General: No focal deficit present.     Mental Status: She is alert and oriented to person, place, and time.     Cranial Nerves: No cranial nerve deficit.  Psychiatric:        Mood and Affect: Mood normal.     ED Results / Procedures / Treatments   Labs (all labs ordered are listed, but only abnormal results are displayed) Labs Reviewed  COMPREHENSIVE METABOLIC PANEL - Abnormal; Notable for the  following components:      Result Value   Sodium 131 (*)    Potassium 3.0 (*)    Chloride 97 (*)    Glucose, Bld 114 (*)    Calcium 8.8 (*)    Total Bilirubin 1.4 (*)    All other components within normal limits  CBC WITH DIFFERENTIAL/PLATELET - Abnormal; Notable for the following components:   Lymphs Abs 0.3 (*)    All other components within normal limits  CBG MONITORING, ED - Abnormal; Notable for the following components:   Glucose-Capillary 100 (*)    All other components within normal limits    EKG EKG Interpretation Date/Time:  Tuesday January 10 2023 08:35:46 EDT Ventricular Rate:  75 PR Interval:  158 QRS Duration:  92 QT Interval:  389 QTC Calculation: 435 R Axis:   67  Text Interpretation: Sinus rhythm Nonspecific T abnormalities, lateral leads Confirmed by Gerhard Munch [phone]) on 01/10/2023 9:02:31 AM  Radiology DG Chest Port 1 View  Result Date: 01/10/2023 CLINICAL DATA:  LH?Marland Kitchen  Syncope. EXAM: PORTABLE CHEST 1 VIEW COMPARISON:  None Available. FINDINGS: Bilateral lung fields are clear. Bilateral lateral costophrenic angles are clear. Normal cardio-mediastinal silhouette. No acute osseous abnormalities. The soft tissues are within normal limits. IMPRESSION: No active disease. Electronically Signed   By: Jules Schick M.D.   On: 01/10/2023 11:31   CT Head Wo Contrast  Result Date: 01/10/2023 CLINICAL DATA:  Syncope/presyncope, cerebrovascular cause suspected Chemo, syncope EXAM: CT HEAD WITHOUT CONTRAST TECHNIQUE: Contiguous axial images were obtained from the base of the skull through the vertex without intravenous contrast. RADIATION DOSE REDUCTION: This exam was performed according to the departmental dose-optimization program which includes automated exposure control, adjustment of the mA and/or kV according to patient size and/or use of iterative reconstruction technique. COMPARISON:  None Available. FINDINGS: Brain: No evidence of acute infarction, hemorrhage,  hydrocephalus, extra-axial collection or mass lesion/mass effect. Remote right PCA territory infarct. Vascular: No hyperdense vessel. Skull: No acute fracture.  Prior posterior right craniotomy. Sinuses/Orbits: Clear sinuses.  No acute orbital findings. Other: No mastoid effusions. IMPRESSION: 1. No evidence of acute intracranial abnormality. 2. Remote right PCA territory infarct. Electronically Signed   By: Feliberto Harts M.D.   On: 01/10/2023 10:29    Procedures Procedures    Medications Ordered in ED Medications  lacosamide (VIMPAT) tablet 50 mg (50 mg Oral Given 01/10/23 1147)  0.9 % NaCl with KCl 20 mEq/ L  infusion ( Intravenous New Bag/Given 01/10/23 1230)    ED Course/ Medical Decision Making/ A&P  Medical Decision Making Adult female on chemotherapy presents after an episode of syncope versus near syncope.  With context of ongoing cancer care differential including progression of disease, metabolic abnormalities, dehydration likely.  Less likely arrhythmia, ACS, though these are considerations. Cardiac 75 sinus normal Pulse ox 100% room air normal   Amount and/or Complexity of Data Reviewed Independent Historian: spouse External Data Reviewed: notes. Labs: ordered. Decision-making details documented in ED Course. Radiology: ordered and independent interpretation performed. Decision-making details documented in ED Course. ECG/medicine tests: ordered and independent interpretation performed. Decision-making details documented in ED Course.  Risk Prescription drug management. Decision regarding hospitalization.   3:04 PM Patient awake, alert, has tolerated fluid resuscitation, potassium repletion without complication, no additional episodes of syncope/near syncope. I discussed all findings at length with the patient and her family members, and absent evidence for other alarming findings, arrhythmia, bacteremia, sepsis, patient comfortable with  discharge and close outpatient follow-up.        Final Clinical Impression(s) / ED Diagnoses Final diagnoses:  Syncope and collapse  Hypokalemia    Rx / DC Orders ED Discharge Orders     None         Gerhard Munch, MD 01/10/23 1505

## 2023-01-10 NOTE — ED Triage Notes (Signed)
BIB [facility] from home Brain surgery April, continues Chemo, last dose Friday. Today was the first time she attempted to get up by herself and had a syncopal episode while having a BM. Did not have a fall, just leaning back on toilet with blank stare per husband.

## 2023-01-17 ENCOUNTER — Inpatient Hospital Stay: Payer: Medicare HMO | Attending: Internal Medicine

## 2023-01-17 ENCOUNTER — Inpatient Hospital Stay (HOSPITAL_BASED_OUTPATIENT_CLINIC_OR_DEPARTMENT_OTHER): Payer: Medicare HMO | Admitting: Internal Medicine

## 2023-01-17 VITALS — BP 142/67 | HR 81 | Temp 97.5°F | Resp 18 | Wt 113.2 lb

## 2023-01-17 DIAGNOSIS — Z7189 Other specified counseling: Secondary | ICD-10-CM | POA: Diagnosis not present

## 2023-01-17 DIAGNOSIS — C713 Malignant neoplasm of parietal lobe: Secondary | ICD-10-CM | POA: Insufficient documentation

## 2023-01-17 DIAGNOSIS — R112 Nausea with vomiting, unspecified: Secondary | ICD-10-CM | POA: Diagnosis not present

## 2023-01-17 DIAGNOSIS — C719 Malignant neoplasm of brain, unspecified: Secondary | ICD-10-CM

## 2023-01-17 DIAGNOSIS — Z79899 Other long term (current) drug therapy: Secondary | ICD-10-CM | POA: Insufficient documentation

## 2023-01-17 DIAGNOSIS — R519 Headache, unspecified: Secondary | ICD-10-CM | POA: Insufficient documentation

## 2023-01-17 LAB — CBC WITH DIFFERENTIAL (CANCER CENTER ONLY)
Abs Immature Granulocytes: 0.04 10*3/uL (ref 0.00–0.07)
Basophils Absolute: 0 10*3/uL (ref 0.0–0.1)
Basophils Relative: 1 %
Eosinophils Absolute: 0.1 10*3/uL (ref 0.0–0.5)
Eosinophils Relative: 2 %
HCT: 43.1 % (ref 36.0–46.0)
Hemoglobin: 14.5 g/dL (ref 12.0–15.0)
Immature Granulocytes: 1 %
Lymphocytes Relative: 10 %
Lymphs Abs: 0.5 10*3/uL — ABNORMAL LOW (ref 0.7–4.0)
MCH: 28.9 pg (ref 26.0–34.0)
MCHC: 33.6 g/dL (ref 30.0–36.0)
MCV: 86 fL (ref 80.0–100.0)
Monocytes Absolute: 0.8 10*3/uL (ref 0.1–1.0)
Monocytes Relative: 13 %
Neutro Abs: 4.2 10*3/uL (ref 1.7–7.7)
Neutrophils Relative %: 73 %
Platelet Count: 325 10*3/uL (ref 150–400)
RBC: 5.01 MIL/uL (ref 3.87–5.11)
RDW: 15.9 % — ABNORMAL HIGH (ref 11.5–15.5)
WBC Count: 5.6 10*3/uL (ref 4.0–10.5)
nRBC: 0 % (ref 0.0–0.2)

## 2023-01-17 LAB — CMP (CANCER CENTER ONLY)
ALT: 10 U/L (ref 0–44)
AST: 14 U/L — ABNORMAL LOW (ref 15–41)
Albumin: 4.5 g/dL (ref 3.5–5.0)
Alkaline Phosphatase: 52 U/L (ref 38–126)
Anion gap: 7 (ref 5–15)
BUN: 9 mg/dL (ref 8–23)
CO2: 31 mmol/L (ref 22–32)
Calcium: 10 mg/dL (ref 8.9–10.3)
Chloride: 100 mmol/L (ref 98–111)
Creatinine: 0.7 mg/dL (ref 0.44–1.00)
GFR, Estimated: >60 mL/min (ref >60)
Glucose, Bld: 121 mg/dL — ABNORMAL HIGH (ref 70–99)
Potassium: 4 mmol/L (ref 3.5–5.1)
Sodium: 138 mmol/L (ref 135–145)
Total Bilirubin: 0.5 mg/dL (ref 0.3–1.2)
Total Protein: 6.8 g/dL (ref 6.5–8.1)

## 2023-01-17 NOTE — Progress Notes (Signed)
[facility] [address]  [place], [place] [address] [phone]   Interval Evaluation  Date of Service: 01/17/23 Patient Name: Donna Barber Patient MRN: [id number] Patient DOB: January 14, 1951 Provider: Henreitta Leber, MD  Identifying Statement:  Donna Barber is a 72 y.o. female with right parietal glioblastoma    Oncologic History: Oncology History  Glioblastoma, IDH-wildtype (HCC)  07/25/2022 Surgery   Craniotomy, right parietal resection with Dr. Shaune Pollack.  Path demonstrates glioblastoma, IDH wild type.   08/29/2022 - 10/11/2022 Radiation Therapy   Completes 6 weeks IMRT and concurrent Temodar Donna Barber)     Biomarkers:  MGMT Unknown.  IDH 1/2 Wild type.  EGFR Unknown  TERT Unknown   Interval History: Donna Barber presents for follow up, now having completed cycle #2 5-day Temodar, with dose increased to 200mg /m2.  Decadron has resumed at 1mg  daily, given severe fatigue, lethargy, nausea.  Sleep volume has increased quite a bit.  Otherwise no new deficits.  Maintains functional independence, no worsening of left sided weakness.   H+P (08/16/22) Patient presents to review recent brain tumor diagnosis.  She presented to medical attention in March 2024 with several days history of new onset severe headache.  CNS imaging demonstrated large enhancing mass in right thalamus and occipital lobe.  She underwent craniotomy, resection with Dr. Shaune Pollack on 07/25/22 at [facility]; path demonstrated glioblastoma IDH wild type.  Following surgery, her left arm and leg were weak, sche completed two weeks of rehab and regained most of her prior function.  Right now has impaired vision on the left, and some weakness of the left arm.  Otherwise functionally independent.  Medications: Current Outpatient Medications on File Prior to Visit  Medication Sig Dispense Refill   acetaminophen (TYLENOL) 325 MG tablet Take 650 mg by mouth every 6 (six) hours as needed.      ALPRAZolam (XANAX) 0.25 MG tablet Take 0.25 mg by mouth at bedtime as needed for anxiety.     dexamethasone (DECADRON) 4 MG tablet Take 1 tablet (4 mg total) by mouth daily. 30 tablet 1   famotidine (PEPCID) 20 MG tablet Take 1 tablet (20 mg total) by mouth at bedtime. 30 tablet 2   lacosamide (VIMPAT) 50 MG TABS tablet TAKE 1 TABLET BY MOUTH 2 TIMES A DAY 60 tablet 3   naproxen (NAPROSYN) 500 MG tablet Take 1 tablet (500 mg total) by mouth every 6 (six) hours as needed for headache. 30 tablet 0   ondansetron (ZOFRAN) 8 MG tablet Take 1 tablet (8 mg total) by mouth every 8 (eight) hours as needed for nausea or vomiting. 30 tablet 1   pantoprazole (PROTONIX) 40 MG tablet TAKE 1 TABLET BY MOUTH 2 TIMES A DAY 30 tablet 2   prochlorperazine (COMPAZINE) 10 MG tablet Take 1 tablet (10 mg total) by mouth every 6 (six) hours as needed. 30 tablet 2   temozolomide (TEMODAR) 100 MG capsule Take 3 capsules (300 mg total) by mouth daily. (Take with 100 mg capsules for total daily dose of 240 mg). May take on an empty stomach to decrease nausea & vomiting. 15 capsule 0   temozolomide (TEMODAR) 100 MG capsule Take 3 capsules (300 mg total) by mouth daily. (Take 3x100 mg capsules for total daily dose of 300 mg). May take on an empty stomach to decrease nausea & vomiting. 15 capsule 0   traZODone (DESYREL) 100 MG tablet Take 1 tablet (100 mg total) by mouth at bedtime. 30  tablet 3   No current facility-administered medications on file prior to visit.    Allergies:  Allergies  Allergen Reactions   Sulfa Antibiotics Nausea And Vomiting   Sulfur Nausea And Vomiting and Nausea Only    Other Reaction(s): GI Intolerance   Codeine Nausea And Vomiting   Elemental Sulfur    Past Medical History:  Past Medical History:  Diagnosis Date   Abnormal Pap smear 2000   Cin-3 treated with LEEP   Canker sores oral    Depression    Dyspareunia    secondary to  atrophy   GERD (gastroesophageal reflux disease)    History  of chicken pox    History of measles, mumps, or rubella    Postmenopausal    Uterine fibroid    Vaginal atrophy    Yeast infection    Past Surgical History:  Past Surgical History:  Procedure Laterality Date   CARPAL TUNNEL RELEASE     DILATION AND CURETTAGE OF UTERUS     shoulder sugery     TUBAL LIGATION     bilateral   Social History:  Social History   Socioeconomic History   Marital status: Married    Spouse name: Not on file   Number of children: Not on file   Years of education: Not on file   Highest education level: Not on file  Occupational History   Not on file  Tobacco Use   Smoking status: Never   Smokeless tobacco: Never  Vaping Use   Vaping status: Never Used  Substance and Sexual Activity   Alcohol use: Yes   Drug use: No   Sexual activity: Yes    Birth control/protection: Surgical  Other Topics Concern   Not on file  Social History Narrative   Not on file   Social Determinants of Health   Financial Resource Strain: Low Risk  (01/04/2022)   [facility]   Overall Financial Resource Strain (CARDIA)    Difficulty of Paying Living Expenses: Not hard at all  Food Insecurity: No Food Insecurity (08/16/2022)   Hunger Vital Sign    Worried About Running Out of Food in the Last Year: Never true    Ran Out of Food in the Last Year: Never true  Transportation Needs: No Transportation Needs (08/16/2022)   PRAPARE - Administrator, Civil Service (Medical): No    Lack of Transportation (Non-Medical): No  Physical Activity: Sufficiently Active (01/04/2022)   [facility]   Exercise Vital Sign    Days of Exercise per Week: 6 days    Minutes of Exercise per Session: 80 min  Stress: No Stress Concern Present (01/04/2022)   Received from [facility], [facility], [facility]   [facility] of Occupational Health - Occupational Stress Questionnaire     Feeling of Stress : Only a little  Social Connections: Unknown (01/05/2023)   [facility]   Social Network    Social Network: Not on file  Intimate Partner Violence: Unknown (01/05/2023)   Received from [facility]   HITS    Physically Hurt: Not on file    Insult or Talk Down To: Not on file    Threaten Physical Harm: Not on file    Scream or Curse: Not on file   Family History:  Family History  Problem Relation Age of Onset   Hypertension Mother    Diabetes Mother  Heart disease Mother    Cancer Brother     Review of Systems: Constitutional: Doesn't report fevers, chills or abnormal weight loss Eyes: Doesn't report blurriness of vision Ears, nose, mouth, throat, and face: Doesn't report sore throat Respiratory: Doesn't report cough, dyspnea or wheezes Cardiovascular: Doesn't report palpitation, chest discomfort  Gastrointestinal:  Doesn't report nausea, constipation, diarrhea GU: Doesn't report incontinence Skin: Doesn't report skin rashes Neurological: Per HPI Musculoskeletal: Doesn't report joint pain Behavioral/Psych: Doesn't report anxiety  Physical Exam: Vitals:   01/17/23 1237  BP: (!) 142/67  Pulse: 81  Resp: 18  Temp: (!) 97.5 F (36.4 C)  SpO2: 100%     KPS: 80. General: Alert, cooperative, pleasant, in no acute distress Head: Normal EENT: No conjunctival injection or scleral icterus.  Lungs: Resp effort normal Cardiac: Regular rate Abdomen: Non-distended abdomen Skin: No rashes cyanosis or petechiae. Extremities: No clubbing or edema  Neurologic Exam: Mental Status: Awake, alert, attentive to examiner. Oriented to self and environment. Language is fluent with intact comprehension.  Cranial Nerves: Visual acuity is grossly normal. Left hemianopia noted. Extra-ocular movements intact. No ptosis. Face is symmetric Motor: Tone and bulk are normal. Power is 4/5 in left arm, 4+/5 in left leg. Reflexes are symmetric, no pathologic  reflexes present.  Sensory: Intact to light touch Gait: Hemiparetic   Labs: I have reviewed the data as listed    Component Value Date/Time   NA 131 (L) 01/10/2023 0830   K 3.0 (L) 01/10/2023 0830   CL 97 (L) 01/10/2023 0830   CO2 23 01/10/2023 0830   GLUCOSE 114 (H) 01/10/2023 0830   BUN 9 01/10/2023 0830   CREATININE 0.55 01/10/2023 0830   CREATININE 0.65 12/20/2022 0854   CALCIUM 8.8 (L) 01/10/2023 0830   PROT 6.6 01/10/2023 0830   ALBUMIN 3.9 01/10/2023 0830   AST 17 01/10/2023 0830   AST 11 (L) 12/20/2022 0854   ALT 16 01/10/2023 0830   ALT 12 12/20/2022 0854   ALKPHOS 52 01/10/2023 0830   BILITOT 1.4 (H) 01/10/2023 0830   BILITOT 0.5 12/20/2022 0854   GFRNONAA >60 01/10/2023 0830   GFRNONAA >60 12/20/2022 0854   Lab Results  Component Value Date   WBC 5.6 01/17/2023   NEUTROABS 4.2 01/17/2023   HGB 14.5 01/17/2023   HCT 43.1 01/17/2023   MCV 86.0 01/17/2023   PLT 325 01/17/2023    Imaging:  [facility] Clinician Interpretation: I have personally reviewed the CNS images as listed.  My interpretation, in the context of the patient's clinical presentation, is stable disease pending official read  [facility] Chest Port 1 View  Result Date: 01/10/2023 CLINICAL DATA:  LH?Marland Kitchen  Syncope. EXAM: PORTABLE CHEST 1 VIEW COMPARISON:  None Available. FINDINGS: Bilateral lung fields are clear. Bilateral lateral costophrenic angles are clear. Normal cardio-mediastinal silhouette. No acute osseous abnormalities. The soft tissues are within normal limits. IMPRESSION: No active disease. Electronically Signed   By: Jules Schick M.D.   On: 01/10/2023 11:31   CT Head Wo Contrast  Result Date: 01/10/2023 CLINICAL DATA:  Syncope/presyncope, cerebrovascular cause suspected Chemo, syncope EXAM: CT HEAD WITHOUT CONTRAST TECHNIQUE: Contiguous axial images were obtained from the base of the skull through the vertex without intravenous contrast. RADIATION DOSE REDUCTION: This exam was performed according to  the departmental dose-optimization program which includes automated exposure control, adjustment of the mA and/or kV according to patient size and/or use of iterative reconstruction technique. COMPARISON:  None Available. FINDINGS: Brain: No evidence of acute infarction, hemorrhage,  hydrocephalus, extra-axial collection or mass lesion/mass effect. Remote right PCA territory infarct. Vascular: No hyperdense vessel. Skull: No acute fracture.  Prior posterior right craniotomy. Sinuses/Orbits: Clear sinuses.  No acute orbital findings. Other: No mastoid effusions. IMPRESSION: 1. No evidence of acute intracranial abnormality. 2. Remote right PCA territory infarct. Electronically Signed   By: Feliberto Harts M.D.   On: 01/10/2023 10:29     Assessment/Plan Glioblastoma, IDH-wildtype (HCC)  Donna Barber presents today having dosed cycle #2 of 5-day Temodar.  Labs are within normal limits.  We recommended continuing treatment with cycle #3 Temozolomide, dose reduced to 150mg /m2, on for five days and off for twenty three days in twenty eight day cycles. The patient will have a complete blood count performed on days 21 and 28 of each cycle, and a comprehensive metabolic panel performed on day 28 of each cycle. Labs may need to be performed more often. Zofran will prescribed for home use for nausea/vomiting.   She would like to wait a few days before starting the next cycle.    Chemotherapy should be held for the following:  ANC less than 1,000  Platelets less than 100,000  LFT or creatinine greater than 2x ULN  If clinical concerns/contraindications develop  Vimpat will con't 50mg  BID.  Ok with resuming decadron 2mg  daily given improvement in energy, appetite.    Headaches are improved compared to prior.  Ok to continue Trazodone 100mg  HS as sleep aid.  Also discussed goals of care today with patient and family.  We appreciate the opportunity to participate in the care of Donna Barber.     We ask that Donna Barber return to clinic in 1 months with MRI brain prior to cycle #4, or sooner as needed.  All questions were answered. The patient knows to call the clinic with any problems, questions or concerns. No barriers to learning were detected.  The total time spent in the encounter was 40 minutes and more than 50% was on counseling and review of test results   Henreitta Leber, MD Medical Director of Neuro-Oncology [facility] at [facility] 01/17/23 12:41 PM

## 2023-01-18 ENCOUNTER — Other Ambulatory Visit: Payer: Self-pay | Admitting: Internal Medicine

## 2023-01-19 ENCOUNTER — Other Ambulatory Visit: Payer: Self-pay | Admitting: *Deleted

## 2023-01-19 ENCOUNTER — Other Ambulatory Visit: Payer: Self-pay

## 2023-01-19 ENCOUNTER — Encounter: Payer: Self-pay | Admitting: Internal Medicine

## 2023-01-19 ENCOUNTER — Other Ambulatory Visit: Payer: Self-pay | Admitting: Internal Medicine

## 2023-01-19 DIAGNOSIS — C719 Malignant neoplasm of brain, unspecified: Secondary | ICD-10-CM

## 2023-01-19 MED ORDER — TEMOZOLOMIDE 100 MG PO CAPS: 100.0000 mg | ORAL_CAPSULE | Freq: Every day | ORAL | 0 refills | Status: DC

## 2023-01-19 MED ORDER — DEXAMETHASONE 2 MG PO TABS
2.0000 mg | ORAL_TABLET | Freq: Every day | ORAL | 0 refills | Status: DC
Start: 2023-01-19 — End: 2023-02-21

## 2023-01-19 MED ORDER — TEMOZOLOMIDE 140 MG PO CAPS: 140.0000 mg | ORAL_CAPSULE | Freq: Every day | ORAL | 0 refills | Status: DC

## 2023-01-26 ENCOUNTER — Telehealth: Payer: Self-pay

## 2023-01-26 NOTE — Telephone Encounter (Signed)
Patients daughter called instating that the patient is feeling better being on the prednisone and her energy has increased but it is to much for her. She is having shakes and not able to rest. She has cut the dose in half to see if that would help but she is still experiencing the same effect. She called to see if it was safe for her to just stop taking the prednisone. I spoke with Dr. Barbaraann Cao he stated it was ok to stop taking th prednisone at this time. Patient daughter also asked if she could take her Xanax in the morning as well as at night. Dr. Barbaraann Cao stated he doesn't see a problem with that but he doesn't prescribe that so she would need to talk to her PCP for direction on that. I called the daughter and made her aware and she voiced full understanding.

## 2023-02-10 ENCOUNTER — Ambulatory Visit (HOSPITAL_COMMUNITY)
Admission: RE | Admit: 2023-02-10 | Discharge: 2023-02-10 | Disposition: A | Discharge status: Home / Self Care | Payer: Medicare HMO | Source: Ambulatory Visit | Attending: Internal Medicine | Admitting: Internal Medicine

## 2023-02-10 DIAGNOSIS — C719 Malignant neoplasm of brain, unspecified: Secondary | ICD-10-CM | POA: Insufficient documentation

## 2023-02-10 MED ORDER — GADOBUTROL 1 MMOL/ML IV SOLN
5.0000 mL | Freq: Once | INTRAVENOUS | Status: AC | PRN
  Administered 2023-02-10: 5 mL via INTRAVENOUS

## 2023-02-14 ENCOUNTER — Other Ambulatory Visit: Payer: Self-pay | Admitting: Internal Medicine

## 2023-02-21 ENCOUNTER — Inpatient Hospital Stay: Payer: Medicare HMO | Attending: Internal Medicine

## 2023-02-21 ENCOUNTER — Inpatient Hospital Stay: Payer: Medicare HMO | Admitting: Internal Medicine

## 2023-02-21 VITALS — BP 149/69 | HR 68 | Temp 97.2°F | Resp 18 | Wt 117.2 lb

## 2023-02-21 DIAGNOSIS — C719 Malignant neoplasm of brain, unspecified: Secondary | ICD-10-CM

## 2023-02-21 DIAGNOSIS — Z79899 Other long term (current) drug therapy: Secondary | ICD-10-CM | POA: Insufficient documentation

## 2023-02-21 DIAGNOSIS — R519 Headache, unspecified: Secondary | ICD-10-CM | POA: Diagnosis not present

## 2023-02-21 DIAGNOSIS — C713 Malignant neoplasm of parietal lobe: Secondary | ICD-10-CM | POA: Diagnosis not present

## 2023-02-21 LAB — CMP (CANCER CENTER ONLY)
ALT: 11 U/L (ref 0–44)
AST: 13 U/L — ABNORMAL LOW (ref 15–41)
Albumin: 4.4 g/dL (ref 3.5–5.0)
Alkaline Phosphatase: 49 U/L (ref 38–126)
Anion gap: 7 (ref 5–15)
BUN: 9 mg/dL (ref 8–23)
CO2: 28 mmol/L (ref 22–32)
Calcium: 9.6 mg/dL (ref 8.9–10.3)
Chloride: 104 mmol/L (ref 98–111)
Creatinine: 0.57 mg/dL (ref 0.44–1.00)
GFR, Estimated: >60 mL/min (ref >60)
Glucose, Bld: 122 mg/dL — ABNORMAL HIGH (ref 70–99)
Potassium: 3.7 mmol/L (ref 3.5–5.1)
Sodium: 139 mmol/L (ref 135–145)
Total Bilirubin: 0.6 mg/dL (ref <1.2)
Total Protein: 6.7 g/dL (ref 6.5–8.1)

## 2023-02-21 LAB — CBC WITH DIFFERENTIAL (CANCER CENTER ONLY)
Abs Immature Granulocytes: 0.03 10*3/uL (ref 0.00–0.07)
Basophils Absolute: 0.1 10*3/uL (ref 0.0–0.1)
Basophils Relative: 1 %
Eosinophils Absolute: 0.1 10*3/uL (ref 0.0–0.5)
Eosinophils Relative: 1 %
HCT: 39.6 % (ref 36.0–46.0)
Hemoglobin: 13.6 g/dL (ref 12.0–15.0)
Immature Granulocytes: 0 %
Lymphocytes Relative: 7 %
Lymphs Abs: 0.6 10*3/uL — ABNORMAL LOW (ref 0.7–4.0)
MCH: 30.1 pg (ref 26.0–34.0)
MCHC: 34.3 g/dL (ref 30.0–36.0)
MCV: 87.6 fL (ref 80.0–100.0)
Monocytes Absolute: 0.7 10*3/uL (ref 0.1–1.0)
Monocytes Relative: 9 %
Neutro Abs: 6.4 10*3/uL (ref 1.7–7.7)
Neutrophils Relative %: 82 %
Platelet Count: 256 10*3/uL (ref 150–400)
RBC: 4.52 MIL/uL (ref 3.87–5.11)
RDW: 16.1 % — ABNORMAL HIGH (ref 11.5–15.5)
WBC Count: 7.8 10*3/uL (ref 4.0–10.5)
nRBC: 0 % (ref 0.0–0.2)

## 2023-02-21 MED ORDER — TEMOZOLOMIDE 140 MG PO CAPS: 140.0000 mg | ORAL_CAPSULE | Freq: Every day | ORAL | 0 refills | Status: DC

## 2023-02-21 MED ORDER — TEMOZOLOMIDE 100 MG PO CAPS: 100.0000 mg | ORAL_CAPSULE | Freq: Every day | ORAL | 0 refills | Status: DC

## 2023-02-21 MED ORDER — DEXAMETHASONE 1 MG PO TABS
1.0000 mg | ORAL_TABLET | Freq: Every day | ORAL | 1 refills | Status: DC
Start: 2023-02-21 — End: 2023-07-10

## 2023-02-21 NOTE — Progress Notes (Signed)
[facility] at [facility] [address]  [place], [place] [address] [phone]   Interval Evaluation  Date of Service: 02/21/23 Patient Name: Donna Barber Patient MRN: [id number] Patient DOB: [birth year] Provider: Henreitta Leber, MD  Identifying Statement:  Donna Barber is a 72 y.o. female with right parietal glioblastoma    Oncologic History: Oncology History  Glioblastoma, IDH-wildtype (HCC)  07/25/2022 Surgery   Craniotomy, right parietal resection with Dr. Shaune Pollack.  Path demonstrates glioblastoma, IDH wild type.   08/29/2022 - 10/11/2022 Radiation Therapy   Completes 6 weeks IMRT and concurrent Temodar [place])     Biomarkers:  MGMT Unknown.  IDH 1/2 Wild type.  EGFR Unknown  TERT Unknown   Interval History: ZONNIE LANDEN presents for follow up, now having completed cycle #3 5-day Temodar, with dose now back at 150mg /m2.  Decadron has resumed at 1mg  daily, doing well in terms of her energy, appetite.  Doses trazodone on occasion.  Otherwise no new deficits.  Maintains functional independence, no worsening of left sided weakness.   H+P (08/16/22) Patient presents to review recent brain tumor diagnosis.  She presented to medical attention in March 2024 with several days history of new onset severe headache.  CNS imaging demonstrated large enhancing mass in right thalamus and occipital lobe.  She underwent craniotomy, resection with Dr. Shaune Pollack on 07/25/22 at [facility]; path demonstrated glioblastoma IDH wild type.  Following surgery, her left arm and leg were weak, sche completed two weeks of rehab and regained most of her prior function.  Right now has impaired vision on the left, and some weakness of the left arm.  Otherwise functionally independent.  Medications: Current Outpatient Medications on File Prior to Visit  Medication Sig Dispense Refill   acetaminophen (TYLENOL) 325 MG tablet Take 650 mg by mouth every 6 (six) hours as needed.      ALPRAZolam (XANAX) 0.25 MG tablet Take 0.25 mg by mouth at bedtime as needed for anxiety.     dexamethasone (DECADRON) 2 MG tablet Take 1 tablet (2 mg total) by mouth daily. 60 tablet 0   dexamethasone (DECADRON) 4 MG tablet Take 1 tablet (4 mg total) by mouth daily. 30 tablet 1   famotidine (PEPCID) 20 MG tablet Take 1 tablet (20 mg total) by mouth at bedtime. 30 tablet 2   lacosamide (VIMPAT) 50 MG TABS tablet TAKE 1 TABLET BY MOUTH 2 TIMES A DAY 60 tablet 3   naproxen (NAPROSYN) 500 MG tablet Take 1 tablet (500 mg total) by mouth every 6 (six) hours as needed for headache. 30 tablet 0   ondansetron (ZOFRAN) 8 MG tablet Take 1 tablet (8 mg total) by mouth every 8 (eight) hours as needed for nausea or vomiting. 30 tablet 1   pantoprazole (PROTONIX) 40 MG tablet TAKE 1 TABLET BY MOUTH 2 TIMES A DAY 30 tablet 2   prochlorperazine (COMPAZINE) 10 MG tablet Take 1 tablet (10 mg total) by mouth every 6 (six) hours as needed. 30 tablet 2   traZODone (DESYREL) 100 MG tablet Take 1 tablet (100 mg total) by mouth at bedtime. 30 tablet 3   temozolomide (TEMODAR) 100 MG capsule Take 3 capsules (300 mg total) by mouth daily. (Take with 100 mg capsules for total daily dose of 240 mg). May take on an empty stomach to decrease nausea & vomiting. 15 capsule 0   temozolomide (TEMODAR) 100 MG capsule Take 3 capsules (300 mg total) by mouth daily. (Take 3x100 mg  capsules for total daily dose of 300 mg). May take on an empty stomach to decrease nausea & vomiting. 15 capsule 0   temozolomide (TEMODAR) 100 MG capsule Take 1 capsule (100 mg total) by mouth daily. (Take with 140 mg capsules for total daily dose of 240 mg). May take on an empty stomach to decrease nausea & vomiting. 5 capsule 0   temozolomide (TEMODAR) 140 MG capsule Take 1 capsule (140 mg total) by mouth daily. (Take with 100 mg capsules for total daily dose of 240 mg). May take on an empty stomach to decrease nausea & vomiting. 5 capsule 0   No current  facility-administered medications on file prior to visit.    Allergies:  Allergies  Allergen Reactions   Sulfa Antibiotics Nausea And Vomiting   Sulfur Nausea And Vomiting and Nausea Only    Other Reaction(s): GI Intolerance   Codeine Nausea And Vomiting   Elemental Sulfur    Past Medical History:  Past Medical History:  Diagnosis Date   Abnormal Pap smear 2000   Cin-3 treated with LEEP   Canker sores oral    Depression    Dyspareunia    secondary to  atrophy   GERD (gastroesophageal reflux disease)    History of chicken pox    History of measles, mumps, or rubella    Postmenopausal    Uterine fibroid    Vaginal atrophy    Yeast infection    Past Surgical History:  Past Surgical History:  Procedure Laterality Date   CARPAL TUNNEL RELEASE     DILATION AND CURETTAGE OF UTERUS     shoulder sugery     TUBAL LIGATION     bilateral   Social History:  Social History   Socioeconomic History   Marital status: Married    Spouse name: Not on file   Number of children: Not on file   Years of education: Not on file   Highest education level: Not on file  Occupational History   Not on file  Tobacco Use   Smoking status: Never   Smokeless tobacco: Never  Vaping Use   Vaping status: Never Used  Substance and Sexual Activity   Alcohol use: Yes   Drug use: No   Sexual activity: Yes    Birth control/protection: Surgical  Other Topics Concern   Not on file  Social History Narrative   Not on file   Social Determinants of Health   Financial Resource Strain: Low Risk  (01/04/2022)   [facility]   Overall Financial Resource Strain (CARDIA)    Difficulty of Paying Living Expenses: Not hard at all  Food Insecurity: No Food Insecurity (08/16/2022)   Hunger Vital Sign    Worried About Running Out of Food in the Last Year: Never true    Ran Out of Food in the Last Year: Never true  Transportation Needs: No Transportation Needs  (08/16/2022)   PRAPARE - Administrator, Civil Service (Medical): No    Lack of Transportation (Non-Medical): No  Physical Activity: Sufficiently Active (01/04/2022)   [facility]   Exercise Vital Sign    Days of Exercise per Week: 6 days    Minutes of Exercise per Session: 80 min  Stress: No Stress Concern Present (01/04/2022)   Received from [facility], [facility], [facility]   [employer] of Occupational Health - Occupational Stress Questionnaire    Feeling of  Stress : Only a little  Social Connections: Unknown (01/05/2023)   Received from [facility]   Social Network    Social Network: Not on file  Intimate Partner Violence: Unknown (01/05/2023)   Received from [facility]   HITS    Physically Hurt: Not on file    Insult or Talk Down To: Not on file    Threaten Physical Harm: Not on file    Scream or Curse: Not on file   Family History:  Family History  Problem Relation Age of Onset   Hypertension Mother    Diabetes Mother    Heart disease Mother    Cancer Brother     Review of Systems: Constitutional: Doesn't report fevers, chills or abnormal weight loss Eyes: Doesn't report blurriness of vision Ears, nose, mouth, throat, and face: Doesn't report sore throat Respiratory: Doesn't report cough, dyspnea or wheezes Cardiovascular: Doesn't report palpitation, chest discomfort  Gastrointestinal:  Doesn't report nausea, constipation, diarrhea GU: Doesn't report incontinence Skin: Doesn't report skin rashes Neurological: Per HPI Musculoskeletal: Doesn't report joint pain Behavioral/Psych: Doesn't report anxiety  Physical Exam: Vitals:   02/21/23 1014  BP: (!) 149/69  Pulse: 68  Resp: 18  Temp: (!) 97.2 F (36.2 C)  SpO2: 99%     KPS: 80. General: Alert, cooperative, pleasant, in no acute distress Head: Normal EENT: No conjunctival injection or scleral icterus.  Lungs: Resp effort  normal Cardiac: Regular rate Abdomen: Non-distended abdomen Skin: No rashes cyanosis or petechiae. Extremities: No clubbing or edema  Neurologic Exam: Mental Status: Awake, alert, attentive to examiner. Oriented to self and environment. Language is fluent with intact comprehension.  Cranial Nerves: Visual acuity is grossly normal. Left hemianopia noted. Extra-ocular movements intact. No ptosis. Face is symmetric Motor: Tone and bulk are normal. Power is 4/5 in left arm, 4+/5 in left leg. Reflexes are symmetric, no pathologic reflexes present.  Sensory: Intact to light touch Gait: Hemiparetic   Labs: I have reviewed the data as listed    Component Value Date/Time   NA 138 01/17/2023 1218   K 4.0 01/17/2023 1218   CL 100 01/17/2023 1218   CO2 31 01/17/2023 1218   GLUCOSE 121 (H) 01/17/2023 1218   BUN 9 01/17/2023 1218   CREATININE 0.70 01/17/2023 1218   CALCIUM 10.0 01/17/2023 1218   PROT 6.8 01/17/2023 1218   ALBUMIN 4.5 01/17/2023 1218   AST 14 (L) 01/17/2023 1218   ALT 10 01/17/2023 1218   ALKPHOS 52 01/17/2023 1218   BILITOT 0.5 01/17/2023 1218   GFRNONAA >60 01/17/2023 1218   Lab Results  Component Value Date   WBC 7.8 02/21/2023   NEUTROABS 6.4 02/21/2023   HGB 13.6 02/21/2023   HCT 39.6 02/21/2023   MCV 87.6 02/21/2023   PLT 256 02/21/2023    Imaging:  [facility] Clinician Interpretation: I have personally reviewed the CNS images as listed.  My interpretation, in the context of the patient's clinical presentation, is stable disease pending official read  MR BRAIN W WO CONTRAST  Result Date: 02/10/2023 CLINICAL DATA:  Glioblastoma follow-up EXAM: MRI HEAD WITHOUT AND WITH CONTRAST TECHNIQUE: Multiplanar, multiecho pulse sequences of the brain and surrounding structures were obtained without and with intravenous contrast. CONTRAST:  5mL GADAVIST GADOBUTROL 1 MMOL/ML IV SOLN COMPARISON:  12/16/2022 FINDINGS: Brain: No acute infarct or acute hemorrhage. Chronic petechiae  within the right occipital and temporal lobes. 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 There is no midline shift. Postsurgical changes of the right  occipital lobe with hyperintense T2-weighted signal extending into the occipital and temporal white matter and to the splenium of the corpus callosum. The extent of white matter signal abnormality is unchanged. There is heterogeneous contrast enhancement in the right occipital lobe with 2 major components. The more anterior abuts the lateral aspect of the right lateral ventricle atrium and measures 3.2 x 2.0 cm, previously 3.0 x 1.6 cm. The more posterior component measures 3.7 x 1.9 cm, unchanged. No new, discrete enhancing lesion. Vascular: Normal flow voids Skull and upper cervical spine: Remote right occipital craniotomy Sinuses/Orbits:No paranasal sinus fluid levels or advanced mucosal thickening. No mastoid or middle ear effusion. Normal orbits. IMPRESSION: 1. Slight increase in size of the anterior component of the heterogeneous contrast enhancement in the right occipital lobe treatment site. The more posterior component is unchanged. 2. Unchanged surrounding hyperintense T2-weighted signal. 3. No new, discrete enhancing lesion. Electronically Signed   By: Deatra Robinson M.D.   On: 02/10/2023 19:32     Assessment/Plan Glioblastoma, IDH-wildtype (HCC)  Donna Barber presents today having dosed cycle #3 of 5-day Temodar.  MRI brain demonstrates mostly stable findings; slight increase in volume of enhancement along lateral ventricle is noted since July.    We recommended continuing treatment with cycle #4 Temozolomide 150mg /m2, on for five days and off for twenty three days in twenty eight day cycles. The patient will have a complete blood count performed on days 21 and 28 of each cycle, and a comprehensive metabolic panel performed on day 28 of each cycle. Labs may need to be performed more often. Zofran will prescribed for home use for  nausea/vomiting.   Chemotherapy should be held for the following:  ANC less than 1,000  Platelets less than 100,000  LFT or creatinine greater than 2x ULN  If clinical concerns/contraindications develop  Vimpat will con't 50mg  BID.  Decadron may continue 1mg  daily.  Headaches are improved compared to prior.  Ok to continue Trazodone 100mg  HS as sleep aid.  We appreciate the opportunity to participate in the care of Donna Barber.    We ask that RYELEE ALBEE return to clinic in 1 months with labs prior to cycle #5, or sooner as needed.  All questions were answered. The patient knows to call the clinic with any problems, questions or concerns. No barriers to learning were detected.  The total time spent in the encounter was 40 minutes and more than 50% was on counseling and review of test results   Henreitta Leber, MD Medical Director of Neuro-Oncology [facility] at [facility] 02/21/23 10:36 AM

## 2023-02-23 ENCOUNTER — Other Ambulatory Visit: Payer: Self-pay

## 2023-02-25 ENCOUNTER — Telehealth: Payer: Self-pay | Admitting: Internal Medicine

## 2023-02-25 NOTE — Telephone Encounter (Signed)
Scheduled per 11/05 los, patient has been called and voicemail was left.

## 2023-03-01 ENCOUNTER — Telehealth: Payer: Self-pay

## 2023-03-01 ENCOUNTER — Other Ambulatory Visit: Payer: Self-pay | Admitting: Internal Medicine

## 2023-03-01 NOTE — Telephone Encounter (Signed)
VM received from pt's husband late this afternoon to report that [facility] is needing an order to fill her Temodar Rx, but he watched Dr. Barbaraann Cao put the order in at pt's last OV. He states she was supposed to start her 5-day course of this med today. Informed that order was placed by Dr. Barbaraann Cao on 02/21/23 and that records show that [facility] confirmed receipt of this prescription on 02/21/23. Husband states that this issue has happened several times before. Due to the late hour, advised pt's husband that Dr. Barbaraann Cao and his nurse for tomorrow will be notified of this, and that it will likely be tomorrow before this issue can be addressed. He is aware to expect a phone call from Dr. Liana Gerold office tomorrow to resolve this problem.

## 2023-03-02 ENCOUNTER — Telehealth: Payer: Self-pay | Admitting: *Deleted

## 2023-03-02 NOTE — Telephone Encounter (Signed)
PC to patient's husband Donna Barber, to F/U on temodar rx they have been expecting.  He said they actually received notification that it is supposed to deliver today.

## 2023-03-06 DIAGNOSIS — Z0000 Encounter for general adult medical examination without abnormal findings: Secondary | ICD-10-CM | POA: Diagnosis not present

## 2023-03-06 DIAGNOSIS — D849 Immunodeficiency, unspecified: Secondary | ICD-10-CM | POA: Diagnosis not present

## 2023-03-06 DIAGNOSIS — M859 Disorder of bone density and structure, unspecified: Secondary | ICD-10-CM | POA: Diagnosis not present

## 2023-03-06 DIAGNOSIS — K219 Gastro-esophageal reflux disease without esophagitis: Secondary | ICD-10-CM | POA: Diagnosis not present

## 2023-03-06 DIAGNOSIS — R7309 Other abnormal glucose: Secondary | ICD-10-CM | POA: Diagnosis not present

## 2023-03-06 DIAGNOSIS — F39 Unspecified mood [affective] disorder: Secondary | ICD-10-CM | POA: Diagnosis not present

## 2023-03-06 DIAGNOSIS — C719 Malignant neoplasm of brain, unspecified: Secondary | ICD-10-CM | POA: Diagnosis not present

## 2023-03-06 DIAGNOSIS — Z1239 Encounter for other screening for malignant neoplasm of breast: Secondary | ICD-10-CM | POA: Diagnosis not present

## 2023-03-06 DIAGNOSIS — E7800 Pure hypercholesterolemia, unspecified: Secondary | ICD-10-CM | POA: Diagnosis not present

## 2023-03-06 DIAGNOSIS — E559 Vitamin D deficiency, unspecified: Secondary | ICD-10-CM | POA: Diagnosis not present

## 2023-03-06 DIAGNOSIS — E46 Unspecified protein-calorie malnutrition: Secondary | ICD-10-CM | POA: Diagnosis not present

## 2023-03-06 DIAGNOSIS — Z8673 Personal history of transient ischemic attack (TIA), and cerebral infarction without residual deficits: Secondary | ICD-10-CM | POA: Diagnosis not present

## 2023-03-13 ENCOUNTER — Emergency Department (HOSPITAL_COMMUNITY): Payer: Medicare HMO

## 2023-03-13 ENCOUNTER — Telehealth: Payer: Self-pay | Admitting: *Deleted

## 2023-03-13 ENCOUNTER — Emergency Department (HOSPITAL_COMMUNITY)
Admission: EM | Admit: 2023-03-13 | Discharge: 2023-03-13 | Disposition: A | Discharge status: Home / Self Care | Payer: Medicare HMO | Attending: Emergency Medicine | Admitting: Emergency Medicine

## 2023-03-13 ENCOUNTER — Encounter (HOSPITAL_COMMUNITY): Payer: Self-pay

## 2023-03-13 DIAGNOSIS — S42255A Nondisplaced fracture of greater tuberosity of left humerus, initial encounter for closed fracture: Secondary | ICD-10-CM | POA: Insufficient documentation

## 2023-03-13 DIAGNOSIS — S0181XA Laceration without foreign body of other part of head, initial encounter: Secondary | ICD-10-CM | POA: Insufficient documentation

## 2023-03-13 DIAGNOSIS — S0990XA Unspecified injury of head, initial encounter: Secondary | ICD-10-CM | POA: Diagnosis not present

## 2023-03-13 DIAGNOSIS — S42292A Other displaced fracture of upper end of left humerus, initial encounter for closed fracture: Secondary | ICD-10-CM | POA: Diagnosis not present

## 2023-03-13 DIAGNOSIS — W01198A Fall on same level from slipping, tripping and stumbling with subsequent striking against other object, initial encounter: Secondary | ICD-10-CM | POA: Diagnosis not present

## 2023-03-13 DIAGNOSIS — Z85841 Personal history of malignant neoplasm of brain: Secondary | ICD-10-CM | POA: Diagnosis not present

## 2023-03-13 DIAGNOSIS — G9389 Other specified disorders of brain: Secondary | ICD-10-CM | POA: Diagnosis not present

## 2023-03-13 DIAGNOSIS — S42215A Unspecified nondisplaced fracture of surgical neck of left humerus, initial encounter for closed fracture: Secondary | ICD-10-CM | POA: Diagnosis not present

## 2023-03-13 DIAGNOSIS — M25522 Pain in left elbow: Secondary | ICD-10-CM | POA: Diagnosis not present

## 2023-03-13 DIAGNOSIS — S42202A Unspecified fracture of upper end of left humerus, initial encounter for closed fracture: Secondary | ICD-10-CM

## 2023-03-13 DIAGNOSIS — G936 Cerebral edema: Secondary | ICD-10-CM | POA: Diagnosis not present

## 2023-03-13 DIAGNOSIS — S4992XA Unspecified injury of left shoulder and upper arm, initial encounter: Secondary | ICD-10-CM | POA: Diagnosis present

## 2023-03-13 DIAGNOSIS — R9431 Abnormal electrocardiogram [ECG] [EKG]: Secondary | ICD-10-CM | POA: Diagnosis not present

## 2023-03-13 DIAGNOSIS — S199XXA Unspecified injury of neck, initial encounter: Secondary | ICD-10-CM | POA: Diagnosis not present

## 2023-03-13 DIAGNOSIS — W19XXXA Unspecified fall, initial encounter: Secondary | ICD-10-CM

## 2023-03-13 MED ORDER — MORPHINE SULFATE (PF) 4 MG/ML IV SOLN
4.0000 mg | Freq: Once | INTRAVENOUS | Status: AC
  Administered 2023-03-13: 4 mg via INTRAVENOUS
  Filled 2023-03-13: qty 1

## 2023-03-13 MED ORDER — ONDANSETRON HCL 8 MG PO TABS: 8.0000 mg | ORAL_TABLET | Freq: Three times a day (TID) | ORAL | 1 refills | Status: AC | PRN

## 2023-03-13 MED ORDER — ONDANSETRON HCL 4 MG/2ML IJ SOLN
4.0000 mg | Freq: Once | INTRAMUSCULAR | Status: AC
  Administered 2023-03-13: 4 mg via INTRAVENOUS
  Filled 2023-03-13: qty 2

## 2023-03-13 MED ORDER — LIDOCAINE-EPINEPHRINE-TETRACAINE (LET) TOPICAL GEL
3.0000 mL | Freq: Once | TOPICAL | Status: AC
  Administered 2023-03-13: 3 mL via TOPICAL
  Filled 2023-03-13: qty 3

## 2023-03-13 MED ORDER — OXYCODONE-ACETAMINOPHEN 5-325 MG PO TABS: 1.0000 | ORAL_TABLET | Freq: Four times a day (QID) | ORAL | 0 refills | Status: DC | PRN

## 2023-03-13 NOTE — ED Provider Notes (Signed)
[place] [facility]ERGENCY DEPARTMENT AT [facility] Provider Note   CSN: [id number] Arrival date & time: 03/13/23  1710     History  Chief Complaint  Patient presents with   CA Pt   Fall   Head Injury    Donna Barber is a 72 y.o. female.   Fall  Head Injury    Patient has a history of osteopenia glioblastoma multiforme who presents today for evaluation after a fall.  Patient states she was trying to put on her boot and she excellently fell landing on her shoulder and hitting her head.  Patient denies loss of consciousness.  She has not had a severe headache.  She is not taking any blood thinning medications.  Patient is primarily having pain in her left shoulder and elbow.  It hurts to move.  She denies any numbness.  No new weakness.    Home Medications Prior to Admission medications   Medication Sig Start Date End Date Taking? Authorizing Provider  oxyCODONE-acetaminophen (PERCOCET/ROXICET) 5-325 MG tablet Take 1 tablet by mouth every 6 (six) hours as needed for severe pain (pain score 7-10). 03/13/23  Yes Linwood Dibbles, MD  acetaminophen (TYLENOL) 325 MG tablet Take 650 mg by mouth every 6 (six) hours as needed.    [provider]  ALPRAZolam Prudy Feeler) 0.25 MG tablet Take 0.25 mg by mouth at bedtime as needed for anxiety.    [provider]  dexamethasone (DECADRON) 1 MG tablet Take 1 tablet (1 mg total) by mouth daily. 02/21/23   Henreitta Leber, MD  famotidine (PEPCID) 20 MG tablet TAKE 1 TABLET BY MOUTH AT BEDTIME 03/01/23   Vaslow, Georgeanna Lea, MD  lacosamide (VIMPAT) 50 MG TABS tablet TAKE 1 TABLET BY MOUTH 2 TIMES A DAY 11/21/22   Vaslow, Georgeanna Lea, MD  naproxen (NAPROSYN) 500 MG tablet Take 1 tablet (500 mg total) by mouth every 6 (six) hours as needed for headache. 11/22/22   Vaslow, Georgeanna Lea, MD  ondansetron (ZOFRAN) 8 MG tablet Take 1 tablet (8 mg total) by mouth every 8 (eight) hours as needed for nausea or vomiting. 01/09/23   Henreitta Leber,  MD  pantoprazole (PROTONIX) 40 MG tablet TAKE 1 TABLET BY MOUTH 2 TIMES A DAY 02/14/23   Vaslow, Georgeanna Lea, MD  prochlorperazine (COMPAZINE) 10 MG tablet Take 1 tablet (10 mg total) by mouth every 6 (six) hours as needed. 01/09/23   Henreitta Leber, MD  temozolomide (TEMODAR) 100 MG capsule Take 3 capsules (300 mg total) by mouth daily. (Take with 100 mg capsules for total daily dose of 240 mg). May take on an empty stomach to decrease nausea & vomiting. 12/20/22   Henreitta Leber, MD  temozolomide (TEMODAR) 100 MG capsule Take 3 capsules (300 mg total) by mouth daily. (Take 3x100 mg capsules for total daily dose of 300 mg). May take on an empty stomach to decrease nausea & vomiting. 12/26/22   Henreitta Leber, MD  temozolomide (TEMODAR) 100 MG capsule Take 1 capsule (100 mg total) by mouth daily. (Take with 140 mg capsules for total daily dose of 240 mg). May take on an empty stomach to decrease nausea & vomiting. 01/19/23   Henreitta Leber, MD  temozolomide (TEMODAR) 100 MG capsule Take 1 capsule (100 mg total) by mouth daily. (Take with 140 mg capsules for total daily dose of 240 mg). May take on an empty stomach to decrease nausea & vomiting. 02/21/23   Henreitta Leber,  MD  temozolomide (TEMODAR) 140 MG capsule Take 1 capsule (140 mg total) by mouth daily. (Take with 100 mg capsules for total daily dose of 240 mg). May take on an empty stomach to decrease nausea & vomiting. 01/19/23   Henreitta Leber, MD  temozolomide (TEMODAR) 140 MG capsule Take 1 capsule (140 mg total) by mouth daily. (Take with 100 mg capsules for total daily dose of 240 mg). May take on an empty stomach to decrease nausea & vomiting. 02/21/23   Henreitta Leber, MD  traZODone (DESYREL) 100 MG tablet Take 1 tablet (100 mg total) by mouth at bedtime. 12/05/22   Henreitta Leber, MD      Allergies    Sulfa antibiotics, Sulfur, Codeine, and Elemental sulfur    Review of Systems   Review of Systems  Physical Exam Updated  Vital Signs BP (!) 179/79   Pulse 83   Temp 98.7 F (37.1 C) (Oral)   Resp 14   Ht 1.651 m (5\' 5" )   Wt 52.2 kg   SpO2 100%   BMI 19.14 kg/m  Physical Exam Vitals and nursing note reviewed.  Constitutional:      Appearance: She is well-developed. She is not diaphoretic.     Comments: Appears to be in pain  HENT:     Head: Normocephalic.     Comments: Approximately 1-2 cm laceration left temple region, no edema, no bony tenderness    Right Ear: External ear normal.     Left Ear: External ear normal.  Eyes:     General: No scleral icterus.       Right eye: No discharge.        Left eye: No discharge.     Conjunctiva/sclera: Conjunctivae normal.  Neck:     Trachea: No tracheal deviation.  Cardiovascular:     Rate and Rhythm: Normal rate.  Pulmonary:     Effort: Pulmonary effort is normal. No respiratory distress.     Breath sounds: No stridor.  Abdominal:     General: There is no distension.  Musculoskeletal:        General: No swelling or deformity.     Left shoulder: No deformity. Decreased range of motion.     Left elbow: Decreased range of motion. Tenderness present.     Left wrist: Normal.     Cervical back: Normal and neck supple.     Thoracic back: Normal.     Lumbar back: Normal.     Right hip: Normal.     Left hip: Normal.  Skin:    General: Skin is warm and dry.     Findings: No rash.  Neurological:     Mental Status: She is alert. Mental status is at baseline.     Cranial Nerves: No dysarthria or facial asymmetry.     Motor: No seizure activity.     ED Results / Procedures / Treatments   Labs (all labs ordered are listed, but only abnormal results are displayed) Labs Reviewed - No data to display  EKG EKG Interpretation Date/Time:  Monday March 13 2023 17:20:08 EST Ventricular Rate:  83 PR Interval:    QRS Duration:  106 QT Interval:  351 QTC Calculation: 413 R Axis:   50  Text Interpretation: Sinus rhythm Artifact Confirmed by Linwood Dibbles [phone]) on 03/13/2023 5:24:58 PM  Radiology CT Head Wo Contrast  Result Date: 03/13/2023 CLINICAL DATA:  Head trauma, minor (Age >= 65y); Neck trauma (Age >= 65y) EXAM:  CT HEAD WITHOUT CONTRAST CT CERVICAL SPINE WITHOUT CONTRAST TECHNIQUE: Multidetector CT imaging of the head and cervical spine was performed following the standard protocol without intravenous contrast. Multiplanar CT image reconstructions of the cervical spine were also generated. RADIATION DOSE REDUCTION: This exam was performed according to the departmental dose-optimization program which includes automated exposure control, adjustment of the mA and/or kV according to patient size and/or use of iterative reconstruction technique. COMPARISON:  MRI without report July 25, 2022. CT head 01/10/2023. FINDINGS: CT HEAD FINDINGS Brain: Encephalomalacia and surrounding edema in the posterior right parietal and occipital lobes, better characterize on MRI with contrast October 25 24. No evidence of acute large vascular territory infarct, acute hemorrhage, midline shift or hydrocephalus. Vascular: No hyperdense vessel identified. Skull: No acute fracture.  Posterior right craniotomy. Sinuses/Orbits: Clear sinuses.  No acute orbital findings. CT CERVICAL SPINE FINDINGS Alignment: Reversal of the normal cervical lordosis. No substantial sagittal subluxation. Skull base and vertebrae: No acute fracture. Vertebral body heights are maintained. Soft tissues and spinal canal: No prevertebral fluid or swelling. No visible canal hematoma. Disc levels:  Moderate multilevel degenerative change. Upper chest: Visualized lung apices are clear. IMPRESSION: 1. No evidence of acute traumatic abnormality intracranially or in the cervical spine. 2. Known right parieto-occipital glioblastoma, better characterize on MRI with contrast October 25, 24. Electronically Signed   By: Feliberto Harts M.D.   On: 03/13/2023 19:30   CT Cervical Spine Wo Contrast  Result  Date: 03/13/2023 CLINICAL DATA:  Head trauma, minor (Age >= 65y); Neck trauma (Age >= 65y) EXAM: CT HEAD WITHOUT CONTRAST CT CERVICAL SPINE WITHOUT CONTRAST TECHNIQUE: Multidetector CT imaging of the head and cervical spine was performed following the standard protocol without intravenous contrast. Multiplanar CT image reconstructions of the cervical spine were also generated. RADIATION DOSE REDUCTION: This exam was performed according to the departmental dose-optimization program which includes automated exposure control, adjustment of the mA and/or kV according to patient size and/or use of iterative reconstruction technique. COMPARISON:  MRI without report July 25, 2022. CT head 01/10/2023. FINDINGS: CT HEAD FINDINGS Brain: Encephalomalacia and surrounding edema in the posterior right parietal and occipital lobes, better characterize on MRI with contrast October 25 24. No evidence of acute large vascular territory infarct, acute hemorrhage, midline shift or hydrocephalus. Vascular: No hyperdense vessel identified. Skull: No acute fracture.  Posterior right craniotomy. Sinuses/Orbits: Clear sinuses.  No acute orbital findings. CT CERVICAL SPINE FINDINGS Alignment: Reversal of the normal cervical lordosis. No substantial sagittal subluxation. Skull base and vertebrae: No acute fracture. Vertebral body heights are maintained. Soft tissues and spinal canal: No prevertebral fluid or swelling. No visible canal hematoma. Disc levels:  Moderate multilevel degenerative change. Upper chest: Visualized lung apices are clear. IMPRESSION: 1. No evidence of acute traumatic abnormality intracranially or in the cervical spine. 2. Known right parieto-occipital glioblastoma, better characterize on MRI with contrast October 25, 24. Electronically Signed   By: Feliberto Harts M.D.   On: 03/13/2023 19:30   DG Elbow Complete Left  Result Date: 03/13/2023 CLINICAL DATA:  Fall with left elbow pain EXAM: LEFT ELBOW - COMPLETE 3+  VIEW COMPARISON:  None Available. FINDINGS: No left elbow fracture, definite joint effusion or dislocation, noting slightly obliquely positioned lateral view. No suspicious focal osseous lesions. No significant arthropathy. No radiopaque foreign bodies. IMPRESSION: No left elbow fracture or dislocation. Electronically Signed   By: Delbert Phenix M.D.   On: 03/13/2023 19:23   DG Shoulder Left  Result Date: 03/13/2023 CLINICAL  DATA:  Fall with left shoulder pain EXAM: LEFT SHOULDER - 2+ VIEW COMPARISON:  None Available. FINDINGS: Nondisplaced mildly comminuted proximal left humerus fracture involving the surgical neck and greater tuberosity. No glenohumeral dislocation. No evidence of acromioclavicular separation. No focal osseous lesions. No radiopaque foreign bodies or pathologic soft tissue calcifications. No significant degenerative arthropathy. IMPRESSION: Nondisplaced mildly comminuted proximal left humerus fracture involving the surgical neck and greater tuberosity. Electronically Signed   By: Delbert Phenix M.D.   On: 03/13/2023 19:22    Procedures .Marland KitchenLaceration Repair  Date/Time: 03/13/2023 8:33 PM  Performed by: Linwood Dibbles, MD Authorized by: Linwood Dibbles, MD   Consent:    Consent obtained:  Verbal   Consent given by:  Patient Universal protocol:    Patient identity confirmed:  Verbally with patient Anesthesia:    Anesthesia method:  Topical application   Topical anesthetic:  LET Laceration details:    Length (cm):  1.5 Pre-procedure details:    Preparation:  Patient was prepped and draped in usual sterile fashion Exploration:    Wound extent: no foreign body, no signs of injury and no underlying fracture     Contaminated: no   Treatment:    Area cleansed with:  Saline   Amount of cleaning:  Standard Skin repair:    Repair method:  Tissue adhesive Approximation:    Approximation:  Close Repair type:    Repair type:  Simple Post-procedure details:    Dressing:  Open (no  dressing)     Medications Ordered in ED Medications  morphine (PF) 4 MG/ML injection 4 mg (4 mg Intravenous Given 03/13/23 1824)  ondansetron (ZOFRAN) injection 4 mg (4 mg Intravenous Given 03/13/23 1823)  lidocaine-EPINEPHrine-tetracaine (LET) topical gel (3 mLs Topical Given 03/13/23 1839)  morphine (PF) 4 MG/ML injection 4 mg (4 mg Intravenous Given 03/13/23 1939)    ED Course/ Medical Decision Making/ A&P Clinical Course as of 03/13/23 2033  Mon Mar 13, 2023  1927 Elbow x-ray without fracture or dislocation [JK]  1927 Shoulder x-ray shows mildly comminuted proximal left humerus fracture [JK]  1957 Head CT  without acute injury [JK]  1958 No C-spine injury [JK]    Clinical Course User Index [JK] Linwood Dibbles, MD                                 Medical Decision Making Problems Addressed: Closed fracture of proximal end of left humerus, unspecified fracture morphology, initial encounter: acute illness or injury that poses a threat to life or bodily functions Facial laceration, initial encounter: acute illness or injury that poses a threat to life or bodily functions Fall, initial encounter: acute illness or injury that poses a threat to life or bodily functions  Amount and/or Complexity of Data Reviewed Radiology: ordered and independent interpretation performed.  Risk Prescription drug management. Parenteral controlled substances.   Patient presented to the ED for evaluation after a fall.  Patient primarily complaining of left shoulder pain.  She also complaining of some elbow discomfort and did have a small laceration lateral to her left eye.  Patient CT scan fortunately does not show any signs of severe head injury or C-spine injury.  Elbow x-ray is negative but the shoulder x-ray does show a proximal humerus fracture.  Patient was treated with IV pain medications.  She was placed in sling.  Stable for discharge with outpatient orthopedic follow-up        Final  Clinical Impression(s) / ED Diagnoses Final diagnoses:  Closed fracture of proximal end of left humerus, unspecified fracture morphology, initial encounter  Facial laceration, initial encounter  Fall, initial encounter    Rx / DC Orders ED Discharge Orders          Ordered    oxyCODONE-acetaminophen (PERCOCET/ROXICET) 5-325 MG tablet  Every 6 hours PRN        03/13/23 2030              Linwood Dibbles, MD 03/13/23 2033

## 2023-03-13 NOTE — Discharge Instructions (Signed)
Take the medications as needed for severe pain.  You can use over-the-counter acetaminophen for milder pain.  Be careful not to exceed 4 g of acetaminophen in a 24-hour period of time.  Follow-up with the orthopedic doctor for further evaluation call to schedule an appointment

## 2023-03-13 NOTE — Telephone Encounter (Signed)
Patients spouse called to report that patient fell and hit head and injured to shoulder. They will take her to [facility] to be evaluated.

## 2023-03-13 NOTE — ED Triage Notes (Addendum)
Pt presents w/ laceration to L side of forehead and L shoulder pain following a trip and fall this afternoon.  Pain score 10/10 w/ movement.  Pt denies LOC and dizziness.  Denies blood thinners.     Last chemo x1 week ago.

## 2023-03-14 DIAGNOSIS — S42202D Unspecified fracture of upper end of left humerus, subsequent encounter for fracture with routine healing: Secondary | ICD-10-CM | POA: Diagnosis not present

## 2023-03-18 ENCOUNTER — Other Ambulatory Visit: Payer: Self-pay | Admitting: Internal Medicine

## 2023-03-21 ENCOUNTER — Inpatient Hospital Stay: Payer: Medicare HMO | Attending: Internal Medicine

## 2023-03-21 ENCOUNTER — Inpatient Hospital Stay: Payer: Medicare HMO | Admitting: Internal Medicine

## 2023-03-21 ENCOUNTER — Other Ambulatory Visit: Payer: Self-pay | Admitting: Internal Medicine

## 2023-03-22 ENCOUNTER — Other Ambulatory Visit: Payer: Self-pay

## 2023-03-23 ENCOUNTER — Inpatient Hospital Stay: Payer: Medicare HMO | Admitting: Internal Medicine

## 2023-03-23 ENCOUNTER — Inpatient Hospital Stay: Payer: Medicare HMO | Attending: Internal Medicine

## 2023-03-23 VITALS — BP 159/85 | HR 88 | Temp 97.5°F | Resp 18 | Wt 116.5 lb

## 2023-03-23 DIAGNOSIS — C713 Malignant neoplasm of parietal lobe: Secondary | ICD-10-CM | POA: Diagnosis not present

## 2023-03-23 DIAGNOSIS — R112 Nausea with vomiting, unspecified: Secondary | ICD-10-CM | POA: Insufficient documentation

## 2023-03-23 DIAGNOSIS — Z79899 Other long term (current) drug therapy: Secondary | ICD-10-CM | POA: Insufficient documentation

## 2023-03-23 DIAGNOSIS — C719 Malignant neoplasm of brain, unspecified: Secondary | ICD-10-CM | POA: Diagnosis not present

## 2023-03-23 DIAGNOSIS — R519 Headache, unspecified: Secondary | ICD-10-CM | POA: Insufficient documentation

## 2023-03-23 DIAGNOSIS — Z923 Personal history of irradiation: Secondary | ICD-10-CM | POA: Diagnosis not present

## 2023-03-23 LAB — CMP (CANCER CENTER ONLY)
ALT: 12 U/L (ref 0–44)
AST: 14 U/L — ABNORMAL LOW (ref 15–41)
Albumin: 4.6 g/dL (ref 3.5–5.0)
Alkaline Phosphatase: 83 U/L (ref 38–126)
Anion gap: 6 (ref 5–15)
BUN: 10 mg/dL (ref 8–23)
CO2: 33 mmol/L — ABNORMAL HIGH (ref 22–32)
Calcium: 9.8 mg/dL (ref 8.9–10.3)
Chloride: 100 mmol/L (ref 98–111)
Creatinine: 0.52 mg/dL (ref 0.44–1.00)
GFR, Estimated: >60 mL/min (ref >60)
Glucose, Bld: 108 mg/dL — ABNORMAL HIGH (ref 70–99)
Potassium: 4 mmol/L (ref 3.5–5.1)
Sodium: 139 mmol/L (ref 135–145)
Total Bilirubin: 0.8 mg/dL (ref <1.2)
Total Protein: 6.9 g/dL (ref 6.5–8.1)

## 2023-03-23 LAB — CBC WITH DIFFERENTIAL (CANCER CENTER ONLY)
Abs Immature Granulocytes: 0.02 10*3/uL (ref 0.00–0.07)
Basophils Absolute: 0 10*3/uL (ref 0.0–0.1)
Basophils Relative: 1 %
Eosinophils Absolute: 0.1 10*3/uL (ref 0.0–0.5)
Eosinophils Relative: 1 %
HCT: 40.8 % (ref 36.0–46.0)
Hemoglobin: 13.4 g/dL (ref 12.0–15.0)
Immature Granulocytes: 0 %
Lymphocytes Relative: 9 %
Lymphs Abs: 0.6 10*3/uL — ABNORMAL LOW (ref 0.7–4.0)
MCH: 30.1 pg (ref 26.0–34.0)
MCHC: 32.8 g/dL (ref 30.0–36.0)
MCV: 91.7 fL (ref 80.0–100.0)
Monocytes Absolute: 0.6 10*3/uL (ref 0.1–1.0)
Monocytes Relative: 9 %
Neutro Abs: 4.9 10*3/uL (ref 1.7–7.7)
Neutrophils Relative %: 80 %
Platelet Count: 366 10*3/uL (ref 150–400)
RBC: 4.45 MIL/uL (ref 3.87–5.11)
RDW: 14.8 % (ref 11.5–15.5)
WBC Count: 6.1 10*3/uL (ref 4.0–10.5)
nRBC: 0 % (ref 0.0–0.2)

## 2023-03-23 NOTE — Progress Notes (Signed)
[facility] [address]  [place], [place] [address] [phone]   Interval Evaluation  Date of Service: 03/23/23 Patient Name: Donna Barber Patient MRN: [id number] Patient DOB: 02/24/1951 Provider: Henreitta Leber, MD  Identifying Statement:  Donna Barber is a 72 y.o. female with right parietal glioblastoma    Oncologic History: Oncology History  Glioblastoma, IDH-wildtype (HCC)  07/25/2022 Surgery   Craniotomy, right parietal resection with Dr. Shaune Pollack.  Path demonstrates glioblastoma, IDH wild type.   08/29/2022 - 10/11/2022 Radiation Therapy   Completes 6 weeks IMRT and concurrent Temodar [facility] [place])     Biomarkers:  MGMT Unknown.  IDH 1/2 Wild type.  EGFR Unknown  TERT Unknown   Interval History: Donna Barber presents for follow up, now having completed cycle #4 5-day Temodar.  Unfortunately she had a provoked fall on 11/25 which resulted in a fracture to the humerus near the shoulder joint on the left side.  She is currently in a sling and will visit with ortho next week for next steps.  Pain is still 7 or 8 at this time, dosing oxycodone PRN.  Otherwise no new deficits.  Maintains functional independence, no worsening of left sided weakness.   H+P (08/16/22) Patient presents to review recent brain tumor diagnosis.  She presented to medical attention in March 2024 with several days history of new onset severe headache.  CNS imaging demonstrated large enhancing mass in right thalamus and occipital lobe.  She underwent craniotomy, resection with Dr. Shaune Pollack on 07/25/22 at [facility]; path demonstrated glioblastoma IDH wild type.  Following surgery, her left arm and leg were weak, sche completed two weeks of rehab and regained most of her prior function.  Right now has impaired vision on the left, and some weakness of the left arm.  Otherwise functionally independent.  Medications: Current Outpatient Medications on File Prior to Visit   Medication Sig Dispense Refill   acetaminophen (TYLENOL) 325 MG tablet Take 650 mg by mouth every 6 (six) hours as needed.     ALPRAZolam (XANAX) 0.25 MG tablet Take 0.25 mg by mouth at bedtime as needed for anxiety.     dexamethasone (DECADRON) 1 MG tablet Take 1 tablet (1 mg total) by mouth daily. 60 tablet 1   famotidine (PEPCID) 20 MG tablet TAKE 1 TABLET BY MOUTH AT BEDTIME 30 tablet 2   HYDROcodone-acetaminophen (NORCO/VICODIN) 5-325 MG tablet Take 1 tablet by mouth every 6 (six) hours as needed.     lacosamide (VIMPAT) 50 MG TABS tablet TAKE 1 TABLET BY MOUTH 2 TIMES A DAY 60 tablet 2   naproxen (NAPROSYN) 500 MG tablet Take 1 tablet (500 mg total) by mouth every 6 (six) hours as needed for headache. 30 tablet 0   ondansetron (ZOFRAN) 8 MG tablet Take 1 tablet (8 mg total) by mouth every 8 (eight) hours as needed for nausea or vomiting. 30 tablet 1   pantoprazole (PROTONIX) 40 MG tablet TAKE 1 TABLET BY MOUTH 2 TIMES A DAY 30 tablet 2   prochlorperazine (COMPAZINE) 10 MG tablet Take 1 tablet (10 mg total) by mouth every 6 (six) hours as needed. 30 tablet 2   traZODone (DESYREL) 100 MG tablet Take 1 tablet (100 mg total) by mouth at bedtime. 30 tablet 3   oxyCODONE-acetaminophen (PERCOCET/ROXICET) 5-325 MG tablet Take 1 tablet by mouth every 6 (six) hours as needed for severe pain (pain score 7-10). 12 tablet 0   temozolomide (TEMODAR) 100 MG capsule  Take 3 capsules (300 mg total) by mouth daily. (Take with 100 mg capsules for total daily dose of 240 mg). May take on an empty stomach to decrease nausea & vomiting. 15 capsule 0   temozolomide (TEMODAR) 100 MG capsule Take 3 capsules (300 mg total) by mouth daily. (Take 3x100 mg capsules for total daily dose of 300 mg). May take on an empty stomach to decrease nausea & vomiting. 15 capsule 0   temozolomide (TEMODAR) 100 MG capsule Take 1 capsule (100 mg total) by mouth daily. (Take with 140 mg capsules for total daily dose of 240 mg). May take  on an empty stomach to decrease nausea & vomiting. 5 capsule 0   temozolomide (TEMODAR) 100 MG capsule Take 1 capsule (100 mg total) by mouth daily. (Take with 140 mg capsules for total daily dose of 240 mg). May take on an empty stomach to decrease nausea & vomiting. 5 capsule 0   temozolomide (TEMODAR) 140 MG capsule Take 1 capsule (140 mg total) by mouth daily. (Take with 100 mg capsules for total daily dose of 240 mg). May take on an empty stomach to decrease nausea & vomiting. 5 capsule 0   temozolomide (TEMODAR) 140 MG capsule Take 1 capsule (140 mg total) by mouth daily. (Take with 100 mg capsules for total daily dose of 240 mg). May take on an empty stomach to decrease nausea & vomiting. 5 capsule 0   No current facility-administered medications on file prior to visit.    Allergies:  Allergies  Allergen Reactions   Sulfa Antibiotics Nausea And Vomiting   Sulfur Nausea And Vomiting and Nausea Only    Other Reaction(s): GI Intolerance   Codeine Nausea And Vomiting   Elemental Sulfur    Past Medical History:  Past Medical History:  Diagnosis Date   Abnormal Pap smear 2000   Cin-3 treated with LEEP   Canker sores oral    Depression    Dyspareunia    secondary to  atrophy   GERD (gastroesophageal reflux disease)    History of chicken pox    History of measles, mumps, or rubella    Postmenopausal    Uterine fibroid    Vaginal atrophy    Yeast infection    Past Surgical History:  Past Surgical History:  Procedure Laterality Date   CARPAL TUNNEL RELEASE     DILATION AND CURETTAGE OF UTERUS     shoulder sugery     TUBAL LIGATION     bilateral   Social History:  Social History   Socioeconomic History   Marital status: Married    Spouse name: Not on file   Number of children: Not on file   Years of education: Not on file   Highest education level: Not on file  Occupational History   Not on file  Tobacco Use   Smoking status: Never   Smokeless tobacco: Never   Vaping Use   Vaping status: Never Used  Substance and Sexual Activity   Alcohol use: Yes   Drug use: No   Sexual activity: Yes    Birth control/protection: Surgical  Other Topics Concern   Not on file  Social History Narrative   Not on file   Social Determinants of Health   Financial Resource Strain: Low Risk  (01/04/2022)   [facility]   Overall Financial Resource Strain (CARDIA)    Difficulty of Paying Living Expenses: Not hard at all  Food Insecurity: No  Food Insecurity (08/16/2022)   Hunger Vital Sign    Worried About Running Out of Food in the Last Year: Never true    Ran Out of Food in the Last Year: Never true  Transportation Needs: No Transportation Needs (08/16/2022)   PRAPARE - Administrator, Civil Service (Medical): No    Lack of Transportation (Non-Medical): No  Physical Activity: Sufficiently Active (01/04/2022)   [facility]   Exercise Vital Sign    Days of Exercise per Week: 6 days    Minutes of Exercise per Session: 80 min  Stress: No Stress Concern Present (01/04/2022)   Received from [facility], [facility], [facility]   [facility] - Occupational Stress Questionnaire    Feeling of Stress : Only a little  Social Connections: Unknown (01/05/2023)   Received from [facility]   Social Network    Social Network: Not on file  Intimate Partner Violence: Unknown (01/05/2023)   Received from [facility]   HITS    Physically Hurt: Not on file    Insult or Talk Down To: Not on file    Threaten Physical Harm: Not on file    Scream or Curse: Not on file   Family History:  Family History  Problem Relation Age of Onset   Hypertension Mother    Diabetes Mother    Heart disease Mother    Cancer Brother     Review of Systems: Constitutional: Doesn't report fevers, chills or abnormal weight loss Eyes: Doesn't report  blurriness of vision Ears, nose, mouth, throat, and face: Doesn't report sore throat Respiratory: Doesn't report cough, dyspnea or wheezes Cardiovascular: Doesn't report palpitation, chest discomfort  Gastrointestinal:  Doesn't report nausea, constipation, diarrhea GU: Doesn't report incontinence Skin: Doesn't report skin rashes Neurological: Per HPI Musculoskeletal: Doesn't report joint pain Behavioral/Psych: Doesn't report anxiety  Physical Exam: Vitals:   03/23/23 1430  BP: (!) 159/85  Pulse: 88  Resp: 18  Temp: (!) 97.5 F (36.4 C)  SpO2: 98%     KPS: 70. General: Alert, cooperative, pleasant, in no acute distress Head: Normal EENT: No conjunctival injection or scleral icterus.  Lungs: Resp effort normal Cardiac: Regular rate Abdomen: Non-distended abdomen Skin: No rashes cyanosis or petechiae. Extremities: Left arm sling, impaired mobility  Neurologic Exam: Mental Status: Awake, alert, attentive to examiner. Oriented to self and environment. Language is fluent with intact comprehension.  Cranial Nerves: Visual acuity is grossly normal. Left hemianopia noted. Extra-ocular movements intact. No ptosis. Face is symmetric Motor: Tone and bulk are normal. Power is 4/5 in left arm, 4+/5 in left leg. Reflexes are symmetric, no pathologic reflexes present.  Sensory: Intact to light touch Gait: Hemiparetic   Labs: I have reviewed the data as listed    Component Value Date/Time   NA 139 02/21/2023 0955   K 3.7 02/21/2023 0955   CL 104 02/21/2023 0955   CO2 28 02/21/2023 0955   GLUCOSE 122 (H) 02/21/2023 0955   BUN 9 02/21/2023 0955   CREATININE 0.57 02/21/2023 0955   CALCIUM 9.6 02/21/2023 0955   PROT 6.7 02/21/2023 0955   ALBUMIN 4.4 02/21/2023 0955   AST 13 (L) 02/21/2023 0955   ALT 11 02/21/2023 0955   ALKPHOS 49 02/21/2023 0955   BILITOT 0.6 02/21/2023 0955   GFRNONAA >60 02/21/2023 0955   Lab Results  Component Value Date   WBC 6.1 03/23/2023   NEUTROABS  4.9 03/23/2023  HGB 13.4 03/23/2023   HCT 40.8 03/23/2023   MCV 91.7 03/23/2023   PLT 366 03/23/2023     Assessment/Plan Glioblastoma, IDH-wildtype (HCC)  Donna Barber presents today having dosed cycle #4 of 5-day Temodar.    We recommended continuing treatment with cycle #5 Temozolomide 150mg /m2, on for five days and off for twenty three days in twenty eight day cycles. The patient will have a complete blood count performed on days 21 and 28 of each cycle, and a comprehensive metabolic panel performed on day 28 of each cycle. Labs may need to be performed more often. Zofran will prescribed for home use for nausea/vomiting.   Will defer cycle #5 for an additional 2-3 weeks to allow for additional healing time for humerus fracture.   Chemotherapy should be held for the following:  ANC less than 1,000  Platelets less than 100,000  LFT or creatinine greater than 2x ULN  If clinical concerns/contraindications develop  Vimpat will con't 50mg  BID.  Decadron may continue 1mg  daily.  Headaches are improved compared to prior.  Ok to continue Trazodone 100mg  HS as sleep aid.  We appreciate the opportunity to participate in the care of Donna Barber.    We ask that Donna Barber return to clinic in 1 months with MRI prior to cycle #5, or sooner as needed.  All questions were answered. The patient knows to call the clinic with any problems, questions or concerns. No barriers to learning were detected.  The total time spent in the encounter was 30 minutes and more than 50% was on counseling and review of test results   Henreitta Leber, MD Medical Director of Neuro-Oncology [facility] at [facility] 03/23/23 2:54 PM

## 2023-03-25 ENCOUNTER — Other Ambulatory Visit: Payer: Self-pay

## 2023-03-28 DIAGNOSIS — S42202D Unspecified fracture of upper end of left humerus, subsequent encounter for fracture with routine healing: Secondary | ICD-10-CM | POA: Diagnosis not present

## 2023-03-31 ENCOUNTER — Other Ambulatory Visit: Payer: Self-pay | Admitting: Internal Medicine

## 2023-03-31 DIAGNOSIS — M25512 Pain in left shoulder: Secondary | ICD-10-CM | POA: Diagnosis not present

## 2023-03-31 DIAGNOSIS — S42202D Unspecified fracture of upper end of left humerus, subsequent encounter for fracture with routine healing: Secondary | ICD-10-CM | POA: Diagnosis not present

## 2023-04-05 DIAGNOSIS — S42202D Unspecified fracture of upper end of left humerus, subsequent encounter for fracture with routine healing: Secondary | ICD-10-CM | POA: Diagnosis not present

## 2023-04-05 DIAGNOSIS — M25512 Pain in left shoulder: Secondary | ICD-10-CM | POA: Diagnosis not present

## 2023-04-06 DIAGNOSIS — K3189 Other diseases of stomach and duodenum: Secondary | ICD-10-CM | POA: Diagnosis not present

## 2023-04-06 DIAGNOSIS — K529 Noninfective gastroenteritis and colitis, unspecified: Secondary | ICD-10-CM | POA: Diagnosis not present

## 2023-04-06 DIAGNOSIS — I700 Atherosclerosis of aorta: Secondary | ICD-10-CM | POA: Diagnosis not present

## 2023-04-06 DIAGNOSIS — K5900 Constipation, unspecified: Secondary | ICD-10-CM | POA: Diagnosis not present

## 2023-04-06 DIAGNOSIS — K5730 Diverticulosis of large intestine without perforation or abscess without bleeding: Secondary | ICD-10-CM | POA: Diagnosis not present

## 2023-04-06 DIAGNOSIS — N3289 Other specified disorders of bladder: Secondary | ICD-10-CM | POA: Diagnosis not present

## 2023-04-09 DIAGNOSIS — S42202D Unspecified fracture of upper end of left humerus, subsequent encounter for fracture with routine healing: Secondary | ICD-10-CM | POA: Diagnosis not present

## 2023-04-09 DIAGNOSIS — M25512 Pain in left shoulder: Secondary | ICD-10-CM | POA: Diagnosis not present

## 2023-04-10 DIAGNOSIS — S42202D Unspecified fracture of upper end of left humerus, subsequent encounter for fracture with routine healing: Secondary | ICD-10-CM | POA: Diagnosis not present

## 2023-04-10 DIAGNOSIS — M25512 Pain in left shoulder: Secondary | ICD-10-CM | POA: Diagnosis not present

## 2023-04-14 ENCOUNTER — Ambulatory Visit (HOSPITAL_COMMUNITY)
Admission: RE | Admit: 2023-04-14 | Discharge: 2023-04-14 | Disposition: A | Discharge status: Home / Self Care | Payer: Medicare HMO | Source: Ambulatory Visit | Attending: Internal Medicine

## 2023-04-14 DIAGNOSIS — C719 Malignant neoplasm of brain, unspecified: Secondary | ICD-10-CM | POA: Diagnosis not present

## 2023-04-14 DIAGNOSIS — R930 Abnormal findings on diagnostic imaging of skull and head, not elsewhere classified: Secondary | ICD-10-CM | POA: Diagnosis not present

## 2023-04-14 MED ORDER — GADOBUTROL 1 MMOL/ML IV SOLN
5.0000 mL | Freq: Once | INTRAVENOUS | Status: AC | PRN
  Administered 2023-04-14: 5 mL via INTRAVENOUS

## 2023-04-17 ENCOUNTER — Inpatient Hospital Stay: Payer: Medicare HMO

## 2023-04-17 ENCOUNTER — Inpatient Hospital Stay (HOSPITAL_BASED_OUTPATIENT_CLINIC_OR_DEPARTMENT_OTHER): Payer: Medicare HMO | Admitting: Internal Medicine

## 2023-04-17 VITALS — BP 141/68 | HR 77 | Temp 97.7°F | Resp 17 | Ht 65.0 in | Wt 117.0 lb

## 2023-04-17 DIAGNOSIS — R519 Headache, unspecified: Secondary | ICD-10-CM | POA: Diagnosis not present

## 2023-04-17 DIAGNOSIS — Z923 Personal history of irradiation: Secondary | ICD-10-CM | POA: Diagnosis not present

## 2023-04-17 DIAGNOSIS — C713 Malignant neoplasm of parietal lobe: Secondary | ICD-10-CM

## 2023-04-17 DIAGNOSIS — Z79899 Other long term (current) drug therapy: Secondary | ICD-10-CM | POA: Diagnosis not present

## 2023-04-17 DIAGNOSIS — C719 Malignant neoplasm of brain, unspecified: Secondary | ICD-10-CM

## 2023-04-17 DIAGNOSIS — R112 Nausea with vomiting, unspecified: Secondary | ICD-10-CM | POA: Diagnosis not present

## 2023-04-17 LAB — CMP (CANCER CENTER ONLY)
ALT: 8 U/L (ref 0–44)
AST: 12 U/L — ABNORMAL LOW (ref 15–41)
Albumin: 4.4 g/dL (ref 3.5–5.0)
Alkaline Phosphatase: 67 U/L (ref 38–126)
Anion gap: 6 (ref 5–15)
BUN: 13 mg/dL (ref 8–23)
CO2: 32 mmol/L (ref 22–32)
Calcium: 9.6 mg/dL (ref 8.9–10.3)
Chloride: 103 mmol/L (ref 98–111)
Creatinine: 0.58 mg/dL (ref 0.44–1.00)
GFR, Estimated: >60 mL/min (ref >60)
Glucose, Bld: 94 mg/dL (ref 70–99)
Potassium: 4.3 mmol/L (ref 3.5–5.1)
Sodium: 141 mmol/L (ref 135–145)
Total Bilirubin: 0.4 mg/dL (ref 0.0–1.2)
Total Protein: 6.5 g/dL (ref 6.5–8.1)

## 2023-04-17 LAB — CBC WITH DIFFERENTIAL (CANCER CENTER ONLY)
Abs Immature Granulocytes: 0.02 10*3/uL (ref 0.00–0.07)
Basophils Absolute: 0.1 10*3/uL (ref 0.0–0.1)
Basophils Relative: 1 %
Eosinophils Absolute: 0.1 10*3/uL (ref 0.0–0.5)
Eosinophils Relative: 2 %
HCT: 39.6 % (ref 36.0–46.0)
Hemoglobin: 13.4 g/dL (ref 12.0–15.0)
Immature Granulocytes: 0 %
Lymphocytes Relative: 12 %
Lymphs Abs: 0.7 10*3/uL (ref 0.7–4.0)
MCH: 30.5 pg (ref 26.0–34.0)
MCHC: 33.8 g/dL (ref 30.0–36.0)
MCV: 90 fL (ref 80.0–100.0)
Monocytes Absolute: 0.7 10*3/uL (ref 0.1–1.0)
Monocytes Relative: 12 %
Neutro Abs: 4.2 10*3/uL (ref 1.7–7.7)
Neutrophils Relative %: 73 %
Platelet Count: 238 10*3/uL (ref 150–400)
RBC: 4.4 MIL/uL (ref 3.87–5.11)
RDW: 13.4 % (ref 11.5–15.5)
WBC Count: 5.7 10*3/uL (ref 4.0–10.5)
nRBC: 0 % (ref 0.0–0.2)

## 2023-04-17 MED ORDER — TEMOZOLOMIDE 140 MG PO CAPS
140.0000 mg | ORAL_CAPSULE | Freq: Every day | ORAL | 0 refills | Status: DC
Start: 2023-04-17 — End: 2023-05-22

## 2023-04-17 MED ORDER — TEMOZOLOMIDE 100 MG PO CAPS: 100.0000 mg | ORAL_CAPSULE | Freq: Every day | ORAL | 0 refills | Status: DC

## 2023-04-17 NOTE — Progress Notes (Signed)
[facility] [address]  [place], [place] [address] [phone]   Interval Evaluation  Date of Service: 04/17/23 Patient Name: Donna Barber Patient MRN: [id number] Patient DOB: 1951/01/03 Provider: Henreitta Leber, MD  Identifying Statement:  Donna Barber is a 72 y.o. female with right parietal glioblastoma    Oncologic History: Oncology History  Glioblastoma, IDH-wildtype (HCC)  07/25/2022 Surgery   Craniotomy, right parietal resection with Dr. Shaune Pollack.  Path demonstrates glioblastoma, IDH wild type.   08/29/2022 - 10/11/2022 Radiation Therapy   Completes 6 weeks IMRT and concurrent Temodar Basilio [place])   11/22/2022 -  Chemotherapy   Initiates 5-day Temodar      Biomarkers:  MGMT Unknown.  IDH 1/2 Wild type.  EGFR Unknown  TERT Unknown   Interval History: Donna Barber presents for follow up, now having completed cycle #5 5-day Temodar.  Mild nausea with the chemo.  No new or progressive changes today.  Denies seizures, headaches.  Still has some dizziness and imbalance.    Prior: Unfortunately she had a provoked fall on 11/25 which resulted in a fracture to the humerus near the shoulder joint on the left side.  She is currently in a sling and will visit with ortho next week for next steps.  Pain is still 7 or 8 at this time, dosing oxycodone PRN.  Otherwise no new deficits.  Maintains functional independence, no worsening of left sided weakness.   H+P (08/16/22) Patient presents to review recent brain tumor diagnosis.  She presented to medical attention in March 2024 with several days history of new onset severe headache.  CNS imaging demonstrated large enhancing mass in right thalamus and occipital lobe.  She underwent craniotomy, resection with Dr. Shaune Pollack on 07/25/22 at [facility]; path demonstrated glioblastoma IDH wild type.  Following surgery, her left arm and leg were weak, sche completed two weeks of rehab and regained most of her prior  function.  Right now has impaired vision on the left, and some weakness of the left arm.  Otherwise functionally independent.  Medications: Current Outpatient Medications on File Prior to Visit  Medication Sig Dispense Refill   acetaminophen (TYLENOL) 325 MG tablet Take 650 mg by mouth every 6 (six) hours as needed.     ALPRAZolam (XANAX) 0.25 MG tablet Take 0.25 mg by mouth at bedtime as needed for anxiety.     dexamethasone (DECADRON) 1 MG tablet Take 1 tablet (1 mg total) by mouth daily. 60 tablet 1   famotidine (PEPCID) 20 MG tablet TAKE 1 TABLET BY MOUTH AT BEDTIME 30 tablet 2   HYDROcodone-acetaminophen (NORCO/VICODIN) 5-325 MG tablet Take 1 tablet by mouth every 6 (six) hours as needed.     lacosamide (VIMPAT) 50 MG TABS tablet TAKE 1 TABLET BY MOUTH 2 TIMES A DAY 60 tablet 2   naproxen (NAPROSYN) 500 MG tablet Take 1 tablet (500 mg total) by mouth every 6 (six) hours as needed for headache. 30 tablet 0   ondansetron (ZOFRAN) 8 MG tablet Take 1 tablet (8 mg total) by mouth every 8 (eight) hours as needed for nausea or vomiting. 30 tablet 1   oxyCODONE-acetaminophen (PERCOCET/ROXICET) 5-325 MG tablet Take 1 tablet by mouth every 6 (six) hours as needed for severe pain (pain score 7-10). 12 tablet 0   pantoprazole (PROTONIX) 40 MG tablet TAKE 1 TABLET BY MOUTH 2 TIMES A DAY 30 tablet 2   prochlorperazine (COMPAZINE) 10 MG tablet Take 1 tablet (10 mg  total) by mouth every 6 (six) hours as needed. 30 tablet 2   temozolomide (TEMODAR) 100 MG capsule Take 3 capsules (300 mg total) by mouth daily. (Take with 100 mg capsules for total daily dose of 240 mg). May take on an empty stomach to decrease nausea & vomiting. 15 capsule 0   temozolomide (TEMODAR) 100 MG capsule Take 3 capsules (300 mg total) by mouth daily. (Take 3x100 mg capsules for total daily dose of 300 mg). May take on an empty stomach to decrease nausea & vomiting. 15 capsule 0   temozolomide (TEMODAR) 100 MG capsule Take 1 capsule  (100 mg total) by mouth daily. (Take with 140 mg capsules for total daily dose of 240 mg). May take on an empty stomach to decrease nausea & vomiting. 5 capsule 0   temozolomide (TEMODAR) 100 MG capsule Take 1 capsule (100 mg total) by mouth daily. (Take with 140 mg capsules for total daily dose of 240 mg). May take on an empty stomach to decrease nausea & vomiting. 5 capsule 0   temozolomide (TEMODAR) 140 MG capsule Take 1 capsule (140 mg total) by mouth daily. (Take with 100 mg capsules for total daily dose of 240 mg). May take on an empty stomach to decrease nausea & vomiting. 5 capsule 0   temozolomide (TEMODAR) 140 MG capsule Take 1 capsule (140 mg total) by mouth daily. (Take with 100 mg capsules for total daily dose of 240 mg). May take on an empty stomach to decrease nausea & vomiting. 5 capsule 0   traZODone (DESYREL) 100 MG tablet TAKE 1 TABLET BY MOUTH AT BEDTIME 30 tablet 3   No current facility-administered medications on file prior to visit.    Allergies:  Allergies  Allergen Reactions   Sulfa Antibiotics Nausea And Vomiting   Sulfur Nausea And Vomiting and Nausea Only    Other Reaction(s): GI Intolerance   Codeine Nausea And Vomiting   Elemental Sulfur    Past Medical History:  Past Medical History:  Diagnosis Date   Abnormal Pap smear 2000   Cin-3 treated with LEEP   Canker sores oral    Depression    Dyspareunia    secondary to  atrophy   GERD (gastroesophageal reflux disease)    History of chicken pox    History of measles, mumps, or rubella    Postmenopausal    Uterine fibroid    Vaginal atrophy    Yeast infection    Past Surgical History:  Past Surgical History:  Procedure Laterality Date   CARPAL TUNNEL RELEASE     DILATION AND CURETTAGE OF UTERUS     shoulder sugery     TUBAL LIGATION     bilateral   Social History:  Social History   Socioeconomic History   Marital status: Married    Spouse name: Not on file   Number of children: Not on file    Years of education: Not on file   Highest education level: Not on file  Occupational History   Not on file  Tobacco Use   Smoking status: Never   Smokeless tobacco: Never  Vaping Use   Vaping status: Never Used  Substance and Sexual Activity   Alcohol use: Yes   Drug use: No   Sexual activity: Yes    Birth control/protection: Surgical  Other Topics Concern   Not on file  Social History Narrative   Not on file   Social Drivers of Health   Financial Resource Strain: Low  Risk  (01/04/2022)   [facility]   Overall Financial Resource Strain (CARDIA)    Difficulty of Paying Living Expenses: Not hard at all  Food Insecurity: No Food Insecurity (08/16/2022)   Hunger Vital Sign    Worried About Running Out of Food in the Last Year: Never true    Ran Out of Food in the Last Year: Never true  Transportation Needs: No Transportation Needs (08/16/2022)   PRAPARE - Administrator, Civil Service (Medical): No    Lack of Transportation (Non-Medical): No  Physical Activity: Sufficiently Active (01/04/2022)   [facility]   Exercise Vital Sign    Days of Exercise per Week: 6 days    Minutes of Exercise per Session: 80 min  Stress: No Stress Concern Present (01/04/2022)   Received from [facility], [facility], [facility]   [facility] of Occupational [facility] - Occupational Stress Questionnaire    Feeling of Stress : Only a little  Social Connections: Unknown (01/05/2023)   Received [facility]   Social Network    Social Network: Not on file  Intimate Partner Violence: Not At Risk (04/06/2023)   Received from [facility]   HITS    Over the last 12 months how often did your partner physically hurt you?: Never    Over the last 12 months how often did your partner insult you or talk down to you?: Never    Over the last 12 months how often did your partner threaten you with  physical harm?: Never    Over the last 12 months how often did your partner scream or curse at you?: Never   Family History:  Family History  Problem Relation Age of Onset   Hypertension Mother    Diabetes Mother    Heart disease Mother    Cancer Brother     Review of Systems: Constitutional: Doesn't report fevers, chills or abnormal weight loss Eyes: Doesn't report blurriness of vision Ears, nose, mouth, throat, and face: Doesn't report sore throat Respiratory: Doesn't report cough, dyspnea or wheezes Cardiovascular: Doesn't report palpitation, chest discomfort  Gastrointestinal:  Doesn't report nausea, constipation, diarrhea GU: Doesn't report incontinence Skin: Doesn't report skin rashes Neurological: Per HPI Musculoskeletal: Doesn't report joint pain Behavioral/Psych: Doesn't report anxiety  Physical Exam: Vitals:   04/17/23 1045  BP: (!) 141/68  Pulse: 77  Resp: 17  Temp: 97.7 F (36.5 C)    KPS: 70. General: Alert, cooperative, pleasant, in no acute distress Head: Normal EENT: No conjunctival injection or scleral icterus.  Lungs: Resp effort normal Cardiac: Regular rate Abdomen: Non-distended abdomen Skin: No rashes cyanosis or petechiae. Extremities: Left arm sling, impaired mobility  Neurologic Exam: Mental Status: Awake, alert, attentive to examiner. Oriented to self and environment. Language is fluent with intact comprehension.  Cranial Nerves: Visual acuity is grossly normal. Left hemianopia noted. Extra-ocular movements intact. No ptosis. Face is symmetric Motor: Tone and bulk are normal. Power is 4/5 in left arm, 4+/5 in left leg. Reflexes are symmetric, no pathologic reflexes present.  Sensory: Intact to light touch Gait: Hemiparetic   Labs: I have reviewed the data as listed    Component Value Date/Time   NA 139 03/23/2023 1414   K 4.0 03/23/2023 1414   CL 100 03/23/2023 1414   CO2 33 (H) 03/23/2023 1414   GLUCOSE 108 (H) 03/23/2023 1414    BUN 10 03/23/2023 1414  CREATININE 0.52 03/23/2023 1414   CALCIUM 9.8 03/23/2023 1414   PROT 6.9 03/23/2023 1414   ALBUMIN 4.6 03/23/2023 1414   AST 14 (L) 03/23/2023 1414   ALT 12 03/23/2023 1414   ALKPHOS 83 03/23/2023 1414   BILITOT 0.8 03/23/2023 1414   GFRNONAA >60 03/23/2023 1414   Lab Results  Component Value Date   WBC 5.7 04/17/2023   NEUTROABS 4.2 04/17/2023   HGB 13.4 04/17/2023   HCT 39.6 04/17/2023   MCV 90.0 04/17/2023   PLT 238 04/17/2023    Imaging:  [facility] Clinician Interpretation: I have personally reviewed the CNS images as listed.  My interpretation, in the context of the patient's clinical presentation, is treatment effect vs true progression  MR BRAIN W WO CONTRAST Result Date: 04/14/2023 CLINICAL DATA:  Brain/CNS neoplasm, assess treatment response. The BM, IDH wild-type. EXAM: MRI HEAD WITHOUT AND WITH CONTRAST TECHNIQUE: Multiplanar, multiecho pulse sequences of the brain and surrounding structures were obtained without and with intravenous contrast. CONTRAST:  5mL GADAVIST GADOBUTROL 1 MMOL/ML IV SOLN COMPARISON:  MR head without and with contrast 02/10/2023 and 12/16/2022. FINDINGS: Brain: Continued progression of ependymal enhancement within the right lateral ventricle is noted. Enhancing tissue extends more superiorly than on the prior exams. Increased seen thickness of the peripheral enhancement in the surgical cavity is noted compared to the prior exams. FLAIR images demonstrate progressive T2 signal changes extending superiorly within the right parietal lobe. Subtle left parietal T2 hyperintensity is stable. Ventricles are of normal size. Left basal ganglia are unremarkable. The brainstem and cerebellum are within normal limits. Midline structures are otherwise unremarkable. Vascular: Flow is present in the major intracranial arteries. Skull and upper cervical spine: The craniocervical junction is normal. Upper cervical spine is within normal limits. Marrow  signal is unremarkable. Sinuses/Orbits: The paranasal sinuses and mastoid air cells are clear. The globes and orbits are within normal limits. IMPRESSION: 1. Continued progression of ependymal enhancement within the right lateral ventricle. 2. Increased thickness of the peripheral enhancement in the surgical cavity is noted compared to the prior exams. 3. Progressive T2 signal changes extending superiorly within the right parietal lobe. Combination of findings is concerning for tumor progression. 4. Subtle left parietal T2 hyperintensity is stable. Electronically Signed   By: Marin Roberts M.D.   On: 04/14/2023 17:48    Assessment/Plan Glioblastoma, IDH-wildtype (HCC)  Donna Barber is clinically stable today, having completed cycle #5 of 5-day Temodar.  MRI demonstrates increase in volume of enhancement which is overall modest when compared to prior studies; etiology is progression of disease, pseudoprogression, or mixed.    She would prefer to dose 1 additional cycle of Temodar and re-image in 1 month.  We also discussed transitioning to next line of systemic therapy.  We recommended continuing treatment with cycle #6 Temozolomide 150mg /m2, on for five days and off for twenty three days in twenty eight day cycles. The patient will have a complete blood count performed on days 21 and 28 of each cycle, and a comprehensive metabolic panel performed on day 28 of each cycle. Labs may need to be performed more often. Zofran will prescribed for home use for nausea/vomiting.   Chemotherapy should be held for the following:  ANC less than 1,000  Platelets less than 100,000  LFT or creatinine greater than 2x ULN  If clinical concerns/contraindications develop  Vimpat will con't 50mg  BID.  Decadron may continue 1mg  daily.  Headaches are improved compared to prior.  Ok to continue Trazodone  100mg  HS as sleep aid.  We appreciate the opportunity to participate in the care of Donna Barber.     We ask that Donna Barber return to clinic in 1 months with MRI prior to cycle #7, or sooner as needed.  All questions were answered. The patient knows to call the clinic with any problems, questions or concerns. No barriers to learning were detected.  The total time spent in the encounter was 40 minutes and more than 50% was on counseling and review of test results   Henreitta Leber, MD Medical Director of [facility] 04/17/23 10:39 AM

## 2023-04-18 DIAGNOSIS — S42202D Unspecified fracture of upper end of left humerus, subsequent encounter for fracture with routine healing: Secondary | ICD-10-CM | POA: Diagnosis not present

## 2023-04-18 DIAGNOSIS — M25512 Pain in left shoulder: Secondary | ICD-10-CM | POA: Diagnosis not present

## 2023-04-19 ENCOUNTER — Other Ambulatory Visit: Payer: Self-pay

## 2023-04-20 DIAGNOSIS — M25512 Pain in left shoulder: Secondary | ICD-10-CM | POA: Diagnosis not present

## 2023-04-20 DIAGNOSIS — S42202D Unspecified fracture of upper end of left humerus, subsequent encounter for fracture with routine healing: Secondary | ICD-10-CM | POA: Diagnosis not present

## 2023-04-21 ENCOUNTER — Other Ambulatory Visit: Payer: Self-pay

## 2023-04-24 DIAGNOSIS — M25512 Pain in left shoulder: Secondary | ICD-10-CM | POA: Diagnosis not present

## 2023-04-24 DIAGNOSIS — S42202D Unspecified fracture of upper end of left humerus, subsequent encounter for fracture with routine healing: Secondary | ICD-10-CM | POA: Diagnosis not present

## 2023-04-26 DIAGNOSIS — S42202D Unspecified fracture of upper end of left humerus, subsequent encounter for fracture with routine healing: Secondary | ICD-10-CM | POA: Diagnosis not present

## 2023-04-26 DIAGNOSIS — M25512 Pain in left shoulder: Secondary | ICD-10-CM | POA: Diagnosis not present

## 2023-05-03 DIAGNOSIS — S42202D Unspecified fracture of upper end of left humerus, subsequent encounter for fracture with routine healing: Secondary | ICD-10-CM | POA: Diagnosis not present

## 2023-05-07 ENCOUNTER — Encounter: Payer: Self-pay | Admitting: Internal Medicine

## 2023-05-14 ENCOUNTER — Other Ambulatory Visit: Payer: Self-pay

## 2023-05-15 ENCOUNTER — Encounter: Payer: Self-pay | Admitting: Internal Medicine

## 2023-05-16 DIAGNOSIS — M25512 Pain in left shoulder: Secondary | ICD-10-CM | POA: Diagnosis not present

## 2023-05-16 DIAGNOSIS — S42202D Unspecified fracture of upper end of left humerus, subsequent encounter for fracture with routine healing: Secondary | ICD-10-CM | POA: Diagnosis not present

## 2023-05-18 ENCOUNTER — Encounter (HOSPITAL_BASED_OUTPATIENT_CLINIC_OR_DEPARTMENT_OTHER): Payer: Self-pay

## 2023-05-19 ENCOUNTER — Ambulatory Visit (HOSPITAL_BASED_OUTPATIENT_CLINIC_OR_DEPARTMENT_OTHER)
Admission: RE | Admit: 2023-05-19 | Discharge: 2023-05-19 | Disposition: A | Discharge status: Home / Self Care | Payer: Medicare Other | Source: Ambulatory Visit | Attending: Internal Medicine | Admitting: Internal Medicine

## 2023-05-19 DIAGNOSIS — C719 Malignant neoplasm of brain, unspecified: Secondary | ICD-10-CM | POA: Insufficient documentation

## 2023-05-19 DIAGNOSIS — G319 Degenerative disease of nervous system, unspecified: Secondary | ICD-10-CM | POA: Insufficient documentation

## 2023-05-19 DIAGNOSIS — G9389 Other specified disorders of brain: Secondary | ICD-10-CM | POA: Diagnosis not present

## 2023-05-19 MED ORDER — GADOBUTROL 1 MMOL/ML IV SOLN
5.0000 mL | Freq: Once | INTRAVENOUS | Status: AC | PRN
  Administered 2023-05-19: 5 mL via INTRAVENOUS
  Filled 2023-05-19: qty 6

## 2023-05-22 ENCOUNTER — Inpatient Hospital Stay: Payer: Medicare Other | Attending: Internal Medicine

## 2023-05-22 ENCOUNTER — Inpatient Hospital Stay: Payer: Medicare Other | Admitting: Internal Medicine

## 2023-05-22 VITALS — BP 157/70 | HR 73 | Temp 97.5°F | Resp 15 | Wt 116.6 lb

## 2023-05-22 DIAGNOSIS — C719 Malignant neoplasm of brain, unspecified: Secondary | ICD-10-CM

## 2023-05-22 DIAGNOSIS — Z79899 Other long term (current) drug therapy: Secondary | ICD-10-CM | POA: Diagnosis not present

## 2023-05-22 DIAGNOSIS — C713 Malignant neoplasm of parietal lobe: Secondary | ICD-10-CM | POA: Insufficient documentation

## 2023-05-22 DIAGNOSIS — R519 Headache, unspecified: Secondary | ICD-10-CM | POA: Insufficient documentation

## 2023-05-22 DIAGNOSIS — Z7189 Other specified counseling: Secondary | ICD-10-CM

## 2023-05-22 DIAGNOSIS — R112 Nausea with vomiting, unspecified: Secondary | ICD-10-CM | POA: Diagnosis not present

## 2023-05-22 LAB — CMP (CANCER CENTER ONLY)
ALT: 10 U/L (ref 0–44)
AST: 13 U/L — ABNORMAL LOW (ref 15–41)
Albumin: 4.6 g/dL (ref 3.5–5.0)
Alkaline Phosphatase: 72 U/L (ref 38–126)
Anion gap: 5 (ref 5–15)
BUN: 8 mg/dL (ref 8–23)
CO2: 31 mmol/L (ref 22–32)
Calcium: 9.5 mg/dL (ref 8.9–10.3)
Chloride: 103 mmol/L (ref 98–111)
Creatinine: 0.64 mg/dL (ref 0.44–1.00)
GFR, Estimated: >60 mL/min (ref >60)
Glucose, Bld: 101 mg/dL — ABNORMAL HIGH (ref 70–99)
Potassium: 4.1 mmol/L (ref 3.5–5.1)
Sodium: 139 mmol/L (ref 135–145)
Total Bilirubin: 0.5 mg/dL (ref 0.0–1.2)
Total Protein: 6.8 g/dL (ref 6.5–8.1)

## 2023-05-22 LAB — CBC WITH DIFFERENTIAL (CANCER CENTER ONLY)
Abs Immature Granulocytes: 0.01 10*3/uL (ref 0.00–0.07)
Basophils Absolute: 0 10*3/uL (ref 0.0–0.1)
Basophils Relative: 1 %
Eosinophils Absolute: 0.1 10*3/uL (ref 0.0–0.5)
Eosinophils Relative: 2 %
HCT: 43.7 % (ref 36.0–46.0)
Hemoglobin: 14.3 g/dL (ref 12.0–15.0)
Immature Granulocytes: 0 %
Lymphocytes Relative: 10 %
Lymphs Abs: 0.6 10*3/uL — ABNORMAL LOW (ref 0.7–4.0)
MCH: 28.5 pg (ref 26.0–34.0)
MCHC: 32.7 g/dL (ref 30.0–36.0)
MCV: 87.1 fL (ref 80.0–100.0)
Monocytes Absolute: 0.7 10*3/uL (ref 0.1–1.0)
Monocytes Relative: 11 %
Neutro Abs: 4.8 10*3/uL (ref 1.7–7.7)
Neutrophils Relative %: 76 %
Platelet Count: 247 10*3/uL (ref 150–400)
RBC: 5.02 MIL/uL (ref 3.87–5.11)
RDW: 13.3 % (ref 11.5–15.5)
WBC Count: 6.2 10*3/uL (ref 4.0–10.5)
nRBC: 0 % (ref 0.0–0.2)

## 2023-05-22 MED ORDER — TEMOZOLOMIDE 140 MG PO CAPS: 140.0000 mg | ORAL_CAPSULE | Freq: Every day | ORAL | 0 refills | Status: DC

## 2023-05-22 MED ORDER — TEMOZOLOMIDE 100 MG PO CAPS: 100.0000 mg | ORAL_CAPSULE | Freq: Every day | ORAL | 0 refills | Status: DC

## 2023-05-22 NOTE — Progress Notes (Signed)
[facility] [address]  [place], [place] [address] [phone]   Interval Evaluation  Date of Service: 05/22/23 Patient Name: Donna Barber Patient MRN: [id number] Patient DOB: [birth year] Provider: Henreitta Leber, MD  Identifying Statement:  Donna Barber is a 73 y.o. female with right parietal glioblastoma    Oncologic History: Oncology History  Glioblastoma, IDH-wildtype (HCC)  07/25/2022 Surgery   Craniotomy, right parietal resection with Dr. Shaune Pollack.  Path demonstrates glioblastoma, IDH wild type.   08/29/2022 - 10/11/2022 Radiation Therapy   Completes 6 weeks IMRT and concurrent Temodar Basilio [place])   11/22/2022 -  Chemotherapy   Initiates 5-day Temodar      Biomarkers:  MGMT Unknown.  IDH 1/2 Wild type.  EGFR Unknown  TERT Unknown   Interval History: Donna Barber presents for follow up, now having completed cycle #6 5-day Temodar.  Continues to tolerate treatment well overall.  No new or progressive changes today.  Denies seizures, headaches.  Still has some dizziness and imbalance.    Prior: Unfortunately she had a provoked fall on 11/25 which resulted in a fracture to the humerus near the shoulder joint on the left side.  She is currently in a sling and will visit with ortho next week for next steps.  Pain is still 7 or 8 at this time, dosing oxycodone PRN.  Otherwise no new deficits.  Maintains functional independence, no worsening of left sided weakness.   H+P (08/16/22) Patient presents to review recent brain tumor diagnosis.  She presented to medical attention in March 2024 with several days history of new onset severe headache.  CNS imaging demonstrated large enhancing mass in right thalamus and occipital lobe.  She underwent craniotomy, resection with Dr. Shaune Pollack on 07/25/22 at [facility]; path demonstrated glioblastoma IDH wild type.  Following surgery, her left arm and leg were weak, sche completed two weeks of rehab and regained  most of her prior function.  Right now has impaired vision on the left, and some weakness of the left arm.  Otherwise functionally independent.  Medications: Current Outpatient Medications on File Prior to Visit  Medication Sig Dispense Refill   acetaminophen (TYLENOL) 325 MG tablet Take 650 mg by mouth every 6 (six) hours as needed.     ALPRAZolam (XANAX) 0.25 MG tablet Take 0.25 mg by mouth at bedtime as needed for anxiety.     dexamethasone (DECADRON) 1 MG tablet Take 1 tablet (1 mg total) by mouth daily. 60 tablet 1   famotidine (PEPCID) 20 MG tablet TAKE 1 TABLET BY MOUTH AT BEDTIME 30 tablet 2   HYDROcodone-acetaminophen (NORCO/VICODIN) 5-325 MG tablet Take 1 tablet by mouth every 6 (six) hours as needed.     lacosamide (VIMPAT) 50 MG TABS tablet TAKE 1 TABLET BY MOUTH 2 TIMES A DAY 60 tablet 2   naproxen (NAPROSYN) 500 MG tablet Take 1 tablet (500 mg total) by mouth every 6 (six) hours as needed for headache. 30 tablet 0   ondansetron (ZOFRAN) 8 MG tablet Take 1 tablet (8 mg total) by mouth every 8 (eight) hours as needed for nausea or vomiting. 30 tablet 1   oxyCODONE-acetaminophen (PERCOCET/ROXICET) 5-325 MG tablet Take 1 tablet by mouth every 6 (six) hours as needed for severe pain (pain score 7-10). 12 tablet 0   pantoprazole (PROTONIX) 40 MG tablet TAKE 1 TABLET BY MOUTH 2 TIMES A DAY 30 tablet 2   prochlorperazine (COMPAZINE) 10 MG tablet Take 1 tablet (10  mg total) by mouth every 6 (six) hours as needed. 30 tablet 2   temozolomide (TEMODAR) 100 MG capsule Take 1 capsule (100 mg total) by mouth daily. (Take with 140 mg capsules for total daily dose of 240 mg). May take on an empty stomach to decrease nausea & vomiting. 5 capsule 0   temozolomide (TEMODAR) 140 MG capsule Take 1 capsule (140 mg total) by mouth daily. (Take with 100 mg capsules for total daily dose of 240 mg). May take on an empty stomach to decrease nausea & vomiting. 5 capsule 0   traZODone (DESYREL) 100 MG tablet TAKE  1 TABLET BY MOUTH AT BEDTIME 30 tablet 3   No current facility-administered medications on file prior to visit.    Allergies:  Allergies  Allergen Reactions   Sulfa Antibiotics Nausea And Vomiting   Sulfur Nausea And Vomiting and Nausea Only    Other Reaction(s): GI Intolerance   Codeine Nausea And Vomiting   Elemental Sulfur    Past Medical History:  Past Medical History:  Diagnosis Date   Abnormal Pap smear 2000   Cin-3 treated with LEEP   Canker sores oral    Depression    Dyspareunia    secondary to  atrophy   GERD (gastroesophageal reflux disease)    History of chicken pox    History of measles, mumps, or rubella    Postmenopausal    Uterine fibroid    Vaginal atrophy    Yeast infection    Past Surgical History:  Past Surgical History:  Procedure Laterality Date   CARPAL TUNNEL RELEASE     CRANIECTOMY / CRANIOTOMY FOR EXCISION OF BRAIN TUMOR  07/25/2022   DILATION AND CURETTAGE OF UTERUS     shoulder sugery     TUBAL LIGATION     bilateral   Social History:  Social History   Socioeconomic History   Marital status: Married    Spouse name: Not on file   Number of children: Not on file   Years of education: Not on file   Highest education level: Not on file  Occupational History   Not on file  Tobacco Use   Smoking status: Never   Smokeless tobacco: Never  Vaping Use   Vaping status: Never Used  Substance and Sexual Activity   Alcohol use: Yes   Drug use: No   Sexual activity: Yes    Birth control/protection: Surgical  Other Topics Concern   Not on file  Social History Narrative   Not on file   Social Drivers of Health   Financial Resource Strain: Low Risk  (01/04/2022)   [facility]   Overall Financial Resource Strain (CARDIA)    Difficulty of Paying Living Expenses: Not hard at all  Food Insecurity: No Food Insecurity (08/16/2022)   Hunger Vital Sign    Worried About Running Out of Food in the  Last Year: Never true    Ran Out of Food in the Last Year: Never true  Transportation Needs: No Transportation Needs (08/16/2022)   PRAPARE - Administrator, Civil Service (Medical): No    Lack of Transportation (Non-Medical): No  Physical Activity: Sufficiently Active (01/04/2022)   [facility]   Exercise Vital Sign    Days of Exercise per Week: 6 days    Minutes of Exercise per Session: 80 min  Stress: No Stress Concern Present (01/04/2022)   Received from [facility],  [facility], [facility]   [employer] of Occupational Health - Occupational Stress Questionnaire    Feeling of Stress : Only a little  Social Connections: Unknown (01/05/2023)   Received from [facility]   Social Network    Social Network: Not on file  Intimate Partner Violence: Not At Risk (04/06/2023)   Received from [facility]   HITS    Over the last 12 months how often did your partner physically hurt you?: Never    Over the last 12 months how often did your partner insult you or talk down to you?: Never    Over the last 12 months how often did your partner threaten you with physical harm?: Never    Over the last 12 months how often did your partner scream or curse at you?: Never   Family History:  Family History  Problem Relation Age of Onset   Hypertension Mother    Diabetes Mother    Heart disease Mother    Cancer Brother     Review of Systems: Constitutional: Doesn't report fevers, chills or abnormal weight loss Eyes: Doesn't report blurriness of vision Ears, nose, mouth, throat, and face: Doesn't report sore throat Respiratory: Doesn't report cough, dyspnea or wheezes Cardiovascular: Doesn't report palpitation, chest discomfort  Gastrointestinal:  Doesn't report nausea, constipation, diarrhea GU: Doesn't report incontinence Skin: Doesn't report skin rashes Neurological: Per HPI Musculoskeletal: Doesn't report joint  pain Behavioral/Psych: Doesn't report anxiety  Physical Exam: Vitals:   05/22/23 1036  BP: (!) 157/70  Pulse: 73  Resp: 15  Temp: (!) 97.5 F (36.4 C)  SpO2: 100%    KPS: 70. General: Alert, cooperative, pleasant, in no acute distress Head: Normal EENT: No conjunctival injection or scleral icterus.  Lungs: Resp effort normal Cardiac: Regular rate Abdomen: Non-distended abdomen Skin: No rashes cyanosis or petechiae. Extremities: Left arm sling, impaired mobility  Neurologic Exam: Mental Status: Awake, alert, attentive to examiner. Oriented to self and environment. Language is fluent with intact comprehension.  Cranial Nerves: Visual acuity is grossly normal. Left hemianopia noted. Extra-ocular movements intact. No ptosis. Face is symmetric Motor: Tone and bulk are normal. Power is 4/5 in left arm, 4+/5 in left leg. Reflexes are symmetric, no pathologic reflexes present.  Sensory: Intact to light touch Gait: Hemiparetic   Labs: I have reviewed the data as listed    Component Value Date/Time   NA 141 04/17/2023 1002   K 4.3 04/17/2023 1002   CL 103 04/17/2023 1002   CO2 32 04/17/2023 1002   GLUCOSE 94 04/17/2023 1002   BUN 13 04/17/2023 1002   CREATININE 0.58 04/17/2023 1002   CALCIUM 9.6 04/17/2023 1002   PROT 6.5 04/17/2023 1002   ALBUMIN 4.4 04/17/2023 1002   AST 12 (L) 04/17/2023 1002   ALT 8 04/17/2023 1002   ALKPHOS 67 04/17/2023 1002   BILITOT 0.4 04/17/2023 1002   GFRNONAA >60 04/17/2023 1002   Lab Results  Component Value Date   WBC 6.2 05/22/2023   NEUTROABS 4.8 05/22/2023   HGB 14.3 05/22/2023   HCT 43.7 05/22/2023   MCV 87.1 05/22/2023   PLT 247 05/22/2023    Imaging:  [facility] Clinician Interpretation: I have personally reviewed the CNS images as listed.  My interpretation, in the context of the patient's clinical presentation, is treatment effect vs true progression  MR BRAIN W WO CONTRAST Result Date: 05/19/2023 CLINICAL DATA:  Brain/CNS  neoplasm, assess treatment response. Glioblastoma. EXAM: MRI HEAD WITHOUT AND WITH CONTRAST TECHNIQUE: Multiplanar,  multiecho pulse sequences of the brain and surrounding structures were obtained without and with intravenous contrast. CONTRAST:  5mL GADAVIST GADOBUTROL 1 MMOL/ML IV SOLN COMPARISON:  Head MRI 04/14/2023 FINDINGS: Brain: Postsurgical changes are again seen with a resection cavity in the right occipital lobe. Thick enhancement along the resection cavity has subtly increased anteriorly (series 18, image 8 and series 17, image 8). Thick enhancement more anteriorly in the right temporal lobe including ependymal enhancement along the temporal horn and atrium of the right lateral ventricle has not significantly changed. Confluent T2 hyperintensity in the right temporal, occipital, and parietal lobes is also unchanged, as is T2 hyperintensity in the splenium of the corpus callosum. Numerous small foci of chronic blood products are again seen in the right temporal and occipital lobes. A small focus of nonspecific T2 hyperintensity in the left parietal white matter is unchanged. No acute infarct or midline shift is evident. Dilatation of the temporal horn of the right lateral ventricle anteriorly is unchanged. Trace extra-axial fluid subjacent to the craniotomy is unchanged. There is mild cerebral atrophy. Vascular: Major intracranial vascular flow voids are preserved. Skull and upper cervical spine: Right parieto-occipital craniotomy. Sinuses/Orbits: Unremarkable orbits. Paranasal sinuses and mastoid air cells are clear. Other: None. IMPRESSION: 1. Slightly increased thickness of enhancement at the anterior aspect of the resection cavity in the right occipital lobe. 2. Otherwise unchanged enhancement and T2 signal abnormality. Electronically Signed   By: Sebastian Ache M.D.   On: 05/19/2023 11:54    Assessment/Plan Glioblastoma, IDH-wildtype (HCC)  Goals of care, counseling/discussion  Donna Barber  is clinically stable today, having completed cycle #6 of 5-day Temodar.  MRI demonstrates only subtle increase in enhancing volume compared to prior study, less than 10% change.    We recommended continuing treatment with cycle #7 Temozolomide 150mg /m2, on for five days and off for twenty three days in twenty eight day cycles. The patient will have a complete blood count performed on days 21 and 28 of each cycle, and a comprehensive metabolic panel performed on day 28 of each cycle. Labs may need to be performed more often. Zofran will prescribed for home use for nausea/vomiting.   Chemotherapy should be held for the following:  ANC less than 1,000  Platelets less than 100,000  LFT or creatinine greater than 2x ULN  If clinical concerns/contraindications develop  Vimpat will con't 50mg  BID.  Decadron may continue 0.5mg  daily.  Headaches are improved compared to prior.  Ok to continue Trazodone 100mg  HS as sleep aid.  We appreciate the opportunity to participate in the care of Donna Barber.    We ask that Donna Barber return to clinic in 1 months with labs prior to cycle #8, or sooner as needed.  Next MRI in 2 months.  All questions were answered. The patient knows to call the clinic with any problems, questions or concerns. No barriers to learning were detected.  The total time spent in the encounter was 40 minutes and more than 50% was on counseling and review of test results   Henreitta Leber, MD Medical Director of Neuro-Oncology [facility] [place] 05/22/23 10:19 AM

## 2023-05-23 ENCOUNTER — Telehealth: Payer: Self-pay | Admitting: Internal Medicine

## 2023-05-23 DIAGNOSIS — M25512 Pain in left shoulder: Secondary | ICD-10-CM | POA: Diagnosis not present

## 2023-05-23 DIAGNOSIS — S42202D Unspecified fracture of upper end of left humerus, subsequent encounter for fracture with routine healing: Secondary | ICD-10-CM | POA: Diagnosis not present

## 2023-05-23 NOTE — Telephone Encounter (Signed)
 Marland Kitchen

## 2023-05-24 ENCOUNTER — Other Ambulatory Visit: Payer: Self-pay

## 2023-05-30 DIAGNOSIS — M25512 Pain in left shoulder: Secondary | ICD-10-CM | POA: Diagnosis not present

## 2023-05-30 DIAGNOSIS — S42202D Unspecified fracture of upper end of left humerus, subsequent encounter for fracture with routine healing: Secondary | ICD-10-CM | POA: Diagnosis not present

## 2023-06-07 DIAGNOSIS — S42202D Unspecified fracture of upper end of left humerus, subsequent encounter for fracture with routine healing: Secondary | ICD-10-CM | POA: Diagnosis not present

## 2023-06-07 DIAGNOSIS — M25512 Pain in left shoulder: Secondary | ICD-10-CM | POA: Diagnosis not present

## 2023-06-14 ENCOUNTER — Other Ambulatory Visit: Payer: Self-pay | Admitting: Internal Medicine

## 2023-06-21 DIAGNOSIS — S42202D Unspecified fracture of upper end of left humerus, subsequent encounter for fracture with routine healing: Secondary | ICD-10-CM | POA: Diagnosis not present

## 2023-06-22 ENCOUNTER — Other Ambulatory Visit: Payer: Self-pay | Admitting: Internal Medicine

## 2023-06-22 ENCOUNTER — Other Ambulatory Visit: Payer: Self-pay

## 2023-06-22 MED ORDER — LACOSAMIDE 50 MG PO TABS: 50.0000 mg | ORAL_TABLET | Freq: Two times a day (BID) | ORAL | 2 refills | Status: DC

## 2023-06-23 DIAGNOSIS — S42202D Unspecified fracture of upper end of left humerus, subsequent encounter for fracture with routine healing: Secondary | ICD-10-CM | POA: Diagnosis not present

## 2023-06-23 DIAGNOSIS — M25512 Pain in left shoulder: Secondary | ICD-10-CM | POA: Diagnosis not present

## 2023-06-26 ENCOUNTER — Inpatient Hospital Stay: Payer: Medicare Other

## 2023-06-26 ENCOUNTER — Telehealth: Payer: Self-pay | Admitting: Internal Medicine

## 2023-06-26 ENCOUNTER — Inpatient Hospital Stay: Payer: Medicare Other | Attending: Internal Medicine | Admitting: Internal Medicine

## 2023-06-26 VITALS — BP 148/65 | HR 73 | Temp 98.4°F | Resp 18 | Ht 65.0 in | Wt 120.7 lb

## 2023-06-26 DIAGNOSIS — R519 Headache, unspecified: Secondary | ICD-10-CM | POA: Diagnosis not present

## 2023-06-26 DIAGNOSIS — C719 Malignant neoplasm of brain, unspecified: Secondary | ICD-10-CM | POA: Diagnosis not present

## 2023-06-26 DIAGNOSIS — C713 Malignant neoplasm of parietal lobe: Secondary | ICD-10-CM | POA: Diagnosis not present

## 2023-06-26 DIAGNOSIS — R112 Nausea with vomiting, unspecified: Secondary | ICD-10-CM | POA: Insufficient documentation

## 2023-06-26 DIAGNOSIS — Z79899 Other long term (current) drug therapy: Secondary | ICD-10-CM | POA: Insufficient documentation

## 2023-06-26 LAB — CBC WITH DIFFERENTIAL (CANCER CENTER ONLY)
Abs Immature Granulocytes: 0.01 10*3/uL (ref 0.00–0.07)
Basophils Absolute: 0.1 10*3/uL (ref 0.0–0.1)
Basophils Relative: 1 %
Eosinophils Absolute: 0.1 10*3/uL (ref 0.0–0.5)
Eosinophils Relative: 2 %
HCT: 42.5 % (ref 36.0–46.0)
Hemoglobin: 13.9 g/dL (ref 12.0–15.0)
Immature Granulocytes: 0 %
Lymphocytes Relative: 17 %
Lymphs Abs: 0.8 10*3/uL (ref 0.7–4.0)
MCH: 28.7 pg (ref 26.0–34.0)
MCHC: 32.7 g/dL (ref 30.0–36.0)
MCV: 87.6 fL (ref 80.0–100.0)
Monocytes Absolute: 0.6 10*3/uL (ref 0.1–1.0)
Monocytes Relative: 13 %
Neutro Abs: 2.9 10*3/uL (ref 1.7–7.7)
Neutrophils Relative %: 67 %
Platelet Count: 220 10*3/uL (ref 150–400)
RBC: 4.85 MIL/uL (ref 3.87–5.11)
RDW: 14.7 % (ref 11.5–15.5)
WBC Count: 4.4 10*3/uL (ref 4.0–10.5)
nRBC: 0 % (ref 0.0–0.2)

## 2023-06-26 LAB — CMP (CANCER CENTER ONLY)
ALT: 13 U/L (ref 0–44)
AST: 15 U/L (ref 15–41)
Albumin: 4.5 g/dL (ref 3.5–5.0)
Alkaline Phosphatase: 69 U/L (ref 38–126)
Anion gap: 4 — ABNORMAL LOW (ref 5–15)
BUN: 11 mg/dL (ref 8–23)
CO2: 34 mmol/L — ABNORMAL HIGH (ref 22–32)
Calcium: 9.4 mg/dL (ref 8.9–10.3)
Chloride: 103 mmol/L (ref 98–111)
Creatinine: 0.61 mg/dL (ref 0.44–1.00)
GFR, Estimated: >60 mL/min (ref >60)
Glucose, Bld: 88 mg/dL (ref 70–99)
Potassium: 3.9 mmol/L (ref 3.5–5.1)
Sodium: 141 mmol/L (ref 135–145)
Total Bilirubin: 0.5 mg/dL (ref 0.0–1.2)
Total Protein: 6.6 g/dL (ref 6.5–8.1)

## 2023-06-26 MED ORDER — TEMOZOLOMIDE 140 MG PO CAPS
140.0000 mg | ORAL_CAPSULE | Freq: Every day | ORAL | 0 refills | Status: DC
Start: 2023-06-26 — End: 2023-07-10

## 2023-06-26 MED ORDER — TEMOZOLOMIDE 100 MG PO CAPS
100.0000 mg | ORAL_CAPSULE | Freq: Every day | ORAL | 0 refills | Status: DC
Start: 2023-06-26 — End: 2023-07-10

## 2023-06-26 NOTE — Progress Notes (Signed)
 [facility] [address]  [place], [place] [address] [phone]   Interval Evaluation  Date of Service: 06/26/23 Patient Name: Donna Barber Patient MRN: [id number] Patient DOB: 01-Feb-1951 Provider: Henreitta Leber, MD  Identifying Statement:  Donna Barber is a 73 y.o. female with right parietal glioblastoma    Oncologic History: Oncology History  Glioblastoma, IDH-wildtype (HCC)  07/25/2022 Surgery   Craniotomy, right parietal resection with Dr. Shaune Pollack.  Path demonstrates glioblastoma, IDH wild type.   08/29/2022 - 10/11/2022 Radiation Therapy   Completes 6 weeks IMRT and concurrent Temodar Basilio [place])   11/22/2022 -  Chemotherapy   Initiates 5-day Temodar      Biomarkers:  MGMT Unknown.  IDH 1/2 Wild type.  EGFR Unknown  TERT Unknown   Interval History: Donna Barber presents for follow up, now having completed cycle #7 5-day Temodar.  Only mild nausea with the chemotherapy.  No new or progressive changes today.  Denies seizures, headaches.  Still has some dizziness and imbalance.    Prior: Unfortunately she had a provoked fall on 11/25 which resulted in a fracture to the humerus near the shoulder joint on the left side.  She is currently in a sling and will visit with ortho next week for next steps.  Pain is still 7 or 8 at this time, dosing oxycodone PRN.  Otherwise no new deficits.  Maintains functional independence, no worsening of left sided weakness.   H+P (08/16/22) Patient presents to review recent brain tumor diagnosis.  She presented to medical attention in March 2024 with several days history of new onset severe headache.  CNS imaging demonstrated large enhancing mass in right thalamus and occipital lobe.  She underwent craniotomy, resection with Dr. Shaune Pollack on 07/25/22 at [facility]; path demonstrated glioblastoma IDH wild type.  Following surgery, her left arm and leg were weak, sche completed two weeks of rehab and regained most  of her prior function.  Right now has impaired vision on the left, and some weakness of the left arm.  Otherwise functionally independent.  Medications: Current Outpatient Medications on File Prior to Visit  Medication Sig Dispense Refill   acetaminophen (TYLENOL) 325 MG tablet Take 650 mg by mouth every 6 (six) hours as needed.     ALPRAZolam (XANAX) 0.25 MG tablet Take 0.25 mg by mouth at bedtime as needed for anxiety.     dexamethasone (DECADRON) 1 MG tablet Take 1 tablet (1 mg total) by mouth daily. 60 tablet 1   famotidine (PEPCID) 20 MG tablet TAKE 1 TABLET BY MOUTH AT BEDTIME 30 tablet 2   HYDROcodone-acetaminophen (NORCO/VICODIN) 5-325 MG tablet Take 1 tablet by mouth every 6 (six) hours as needed.     lacosamide (VIMPAT) 50 MG TABS tablet Take 1 tablet (50 mg total) by mouth 2 (two) times daily. 60 tablet 2   naproxen (NAPROSYN) 500 MG tablet Take 1 tablet (500 mg total) by mouth every 6 (six) hours as needed for headache. 30 tablet 0   ondansetron (ZOFRAN) 8 MG tablet Take 1 tablet (8 mg total) by mouth every 8 (eight) hours as needed for nausea or vomiting. 30 tablet 1   oxyCODONE-acetaminophen (PERCOCET/ROXICET) 5-325 MG tablet Take 1 tablet by mouth every 6 (six) hours as needed for severe pain (pain score 7-10). 12 tablet 0   pantoprazole (PROTONIX) 40 MG tablet TAKE 1 TABLET BY MOUTH 2 TIMES A DAY 30 tablet 2   prochlorperazine (COMPAZINE) 10 MG tablet Take  1 tablet (10 mg total) by mouth every 6 (six) hours as needed. 30 tablet 2   temozolomide (TEMODAR) 100 MG capsule Take 1 capsule (100 mg total) by mouth daily. (Take with 140 mg capsules for total daily dose of 240 mg). May take on an empty stomach to decrease nausea & vomiting. 5 capsule 0   temozolomide (TEMODAR) 140 MG capsule Take 1 capsule (140 mg total) by mouth daily. (Take with 100 mg capsules for total daily dose of 240 mg). May take on an empty stomach to decrease nausea & vomiting. 5 capsule 0   traZODone (DESYREL) 100  MG tablet TAKE 1 TABLET BY MOUTH AT BEDTIME 30 tablet 3   No current facility-administered medications on file prior to visit.    Allergies:  Allergies  Allergen Reactions   Sulfa Antibiotics Nausea And Vomiting   Sulfur Nausea And Vomiting and Nausea Only    Other Reaction(s): GI Intolerance   Codeine Nausea And Vomiting   Elemental Sulfur    Past Medical History:  Past Medical History:  Diagnosis Date   Abnormal Pap smear 2000   Cin-3 treated with LEEP   Canker sores oral    Depression    Dyspareunia    secondary to  atrophy   GERD (gastroesophageal reflux disease)    History of chicken pox    History of measles, mumps, or rubella    Postmenopausal    Uterine fibroid    Vaginal atrophy    Yeast infection    Past Surgical History:  Past Surgical History:  Procedure Laterality Date   CARPAL TUNNEL RELEASE     CRANIECTOMY / CRANIOTOMY FOR EXCISION OF BRAIN TUMOR  07/25/2022   DILATION AND CURETTAGE OF UTERUS     shoulder sugery     TUBAL LIGATION     bilateral   Social History:  Social History   Socioeconomic History   Marital status: Married    Spouse name: Not on file   Number of children: Not on file   Years of education: Not on file   Highest education level: Not on file  Occupational History   Not on file  Tobacco Use   Smoking status: Never   Smokeless tobacco: Never  Vaping Use   Vaping status: Never Used  Substance and Sexual Activity   Alcohol use: Yes   Drug use: No   Sexual activity: Yes    Birth control/protection: Surgical  Other Topics Concern   Not on file  Social History Narrative   Not on file   Social Drivers of Health   Financial Resource Strain: Low Risk  (01/04/2022)   [facility]   Overall Financial Resource Strain (CARDIA)    Difficulty of Paying Living Expenses: Not hard at all  Food Insecurity: No Food Insecurity (08/16/2022)   Hunger Vital Sign    Worried About Running Out  of Food in the Last Year: Never true    Ran Out of Food in the Last Year: Never true  Transportation Needs: No Transportation Needs (08/16/2022)   PRAPARE - Administrator, Civil Service (Medical): No    Lack of Transportation (Non-Medical): No  Physical Activity: Sufficiently Active (01/04/2022)   [facility]   Exercise Vital Sign    Days of Exercise per Week: 6 days    Minutes of Exercise per Session: 80 min  Stress: No Stress Concern Present (01/04/2022)   Received  from [facility], [facility], [facility]   [employer] of Occupational Health - Occupational Stress Questionnaire    Feeling of Stress : Only a little  Social Connections: Unknown (01/05/2023)   Received from [facility]   Social Network    Social Network: Not on file  Intimate Partner Violence: Not At Risk (04/06/2023)   Received from [facility]   HITS    Over the last 12 months how often did your partner physically hurt you?: Never    Over the last 12 months how often did your partner insult you or talk down to you?: Never    Over the last 12 months how often did your partner threaten you with physical harm?: Never    Over the last 12 months how often did your partner scream or curse at you?: Never   Family History:  Family History  Problem Relation Age of Onset   Hypertension Mother    Diabetes Mother    Heart disease Mother    Cancer Brother     Review of Systems: Constitutional: Doesn't report fevers, chills or abnormal weight loss Eyes: Doesn't report blurriness of vision Ears, nose, mouth, throat, and face: Doesn't report sore throat Respiratory: Doesn't report cough, dyspnea or wheezes Cardiovascular: Doesn't report palpitation, chest discomfort  Gastrointestinal:  Doesn't report nausea, constipation, diarrhea GU: Doesn't report incontinence Skin: Doesn't report skin rashes Neurological: Per HPI Musculoskeletal: Doesn't report  joint pain Behavioral/Psych: Doesn't report anxiety  Physical Exam: Vitals:   06/26/23 0943  BP: (!) 148/65  Pulse: 73  Resp: 18  Temp: 98.4 F (36.9 C)  SpO2: 100%   KPS: 70. General: Alert, cooperative, pleasant, in no acute distress Head: Normal EENT: No conjunctival injection or scleral icterus.  Lungs: Resp effort normal Cardiac: Regular rate Abdomen: Non-distended abdomen Skin: No rashes cyanosis or petechiae. Extremities: Left arm sling, impaired mobility  Neurologic Exam: Mental Status: Awake, alert, attentive to examiner. Oriented to self and environment. Language is fluent with intact comprehension.  Cranial Nerves: Visual acuity is grossly normal. Left hemianopia noted. Extra-ocular movements intact. No ptosis. Face is symmetric Motor: Tone and bulk are normal. Power is 4/5 in left arm, 4+/5 in left leg. Reflexes are symmetric, no pathologic reflexes present.  Sensory: Intact to light touch Gait: Hemiparetic   Labs: I have reviewed the data as listed    Component Value Date/Time   NA 139 05/22/2023 0959   K 4.1 05/22/2023 0959   CL 103 05/22/2023 0959   CO2 31 05/22/2023 0959   GLUCOSE 101 (H) 05/22/2023 0959   BUN 8 05/22/2023 0959   CREATININE 0.64 05/22/2023 0959   CALCIUM 9.5 05/22/2023 0959   PROT 6.8 05/22/2023 0959   ALBUMIN 4.6 05/22/2023 0959   AST 13 (L) 05/22/2023 0959   ALT 10 05/22/2023 0959   ALKPHOS 72 05/22/2023 0959   BILITOT 0.5 05/22/2023 0959   GFRNONAA >60 05/22/2023 0959   Lab Results  Component Value Date   WBC 6.2 05/22/2023   NEUTROABS 4.8 05/22/2023   HGB 14.3 05/22/2023   HCT 43.7 05/22/2023   MCV 87.1 05/22/2023   PLT 247 05/22/2023     Assessment/Plan Glioblastoma, IDH-wildtype (HCC)  Donna Barber is clinically stable today, having completed cycle #7 of 5-day Temodar.  No new or progressive changes.  We recommended continuing treatment with cycle #8 Temozolomide 150mg /m2, on for five days and off for twenty  three days in twenty eight day cycles. The patient will have a  complete blood count performed on days 21 and 28 of each cycle, and a comprehensive metabolic panel performed on day 28 of each cycle. Labs may need to be performed more often. Zofran will prescribed for home use for nausea/vomiting.   Chemotherapy should be held for the following:  ANC less than 1,000  Platelets less than 100,000  LFT or creatinine greater than 2x ULN  If clinical concerns/contraindications develop  Vimpat will con't 50mg  BID.  Decadron may continue 0.5mg  daily.  Headaches are improved compared to prior.  Ok to continue Trazodone 100mg  HS as sleep aid.  We appreciate the opportunity to participate in the care of Donna Barber.    We ask that Donna Barber return to clinic in 1 months with MRI brain prior to cycle #9, or sooner as needed.    All questions were answered. The patient knows to call the clinic with any problems, questions or concerns. No barriers to learning were detected.  The total time spent in the encounter was 30 minutes and more than 50% was on counseling and review of test results   Henreitta Leber, MD Medical Director of Neuro-Oncology [facility] 06/26/23 9:26 AM

## 2023-06-26 NOTE — Telephone Encounter (Signed)
 Patient Scheduled appts. Patient is aware of all appt details.

## 2023-06-27 ENCOUNTER — Other Ambulatory Visit: Payer: Self-pay

## 2023-06-28 DIAGNOSIS — M25512 Pain in left shoulder: Secondary | ICD-10-CM | POA: Diagnosis not present

## 2023-06-28 DIAGNOSIS — S42202D Unspecified fracture of upper end of left humerus, subsequent encounter for fracture with routine healing: Secondary | ICD-10-CM | POA: Diagnosis not present

## 2023-07-01 ENCOUNTER — Ambulatory Visit (HOSPITAL_COMMUNITY)
Admission: RE | Admit: 2023-07-01 | Discharge: 2023-07-01 | Disposition: A | Discharge status: Home / Self Care | Source: Ambulatory Visit | Attending: Internal Medicine

## 2023-07-01 DIAGNOSIS — C719 Malignant neoplasm of brain, unspecified: Secondary | ICD-10-CM | POA: Insufficient documentation

## 2023-07-01 DIAGNOSIS — I6381 Other cerebral infarction due to occlusion or stenosis of small artery: Secondary | ICD-10-CM | POA: Diagnosis not present

## 2023-07-01 DIAGNOSIS — G9389 Other specified disorders of brain: Secondary | ICD-10-CM | POA: Diagnosis not present

## 2023-07-01 MED ORDER — GADOBUTROL 1 MMOL/ML IV SOLN
5.0000 mL | Freq: Once | INTRAVENOUS | Status: AC | PRN
  Administered 2023-07-01: 5 mL via INTRAVENOUS

## 2023-07-03 ENCOUNTER — Telehealth: Payer: Self-pay | Admitting: *Deleted

## 2023-07-03 NOTE — Telephone Encounter (Signed)
 PC to patient, informed her Dr Barbaraann Cao wants to see her next week since she had brain MRI on 07/01/23.  Lab & MD appointments scheduled for 07/10/23 at 2:15 & 2:30 respectively.  Patient verbalizes understanding.

## 2023-07-10 ENCOUNTER — Inpatient Hospital Stay (HOSPITAL_BASED_OUTPATIENT_CLINIC_OR_DEPARTMENT_OTHER): Admitting: Internal Medicine

## 2023-07-10 ENCOUNTER — Inpatient Hospital Stay

## 2023-07-10 ENCOUNTER — Telehealth: Payer: Self-pay | Admitting: Internal Medicine

## 2023-07-10 VITALS — BP 157/75 | HR 72 | Temp 98.7°F | Resp 18 | Wt 119.1 lb

## 2023-07-10 DIAGNOSIS — C719 Malignant neoplasm of brain, unspecified: Secondary | ICD-10-CM

## 2023-07-10 DIAGNOSIS — Z79899 Other long term (current) drug therapy: Secondary | ICD-10-CM | POA: Diagnosis not present

## 2023-07-10 DIAGNOSIS — R519 Headache, unspecified: Secondary | ICD-10-CM | POA: Diagnosis not present

## 2023-07-10 DIAGNOSIS — C713 Malignant neoplasm of parietal lobe: Secondary | ICD-10-CM

## 2023-07-10 DIAGNOSIS — R112 Nausea with vomiting, unspecified: Secondary | ICD-10-CM | POA: Diagnosis not present

## 2023-07-10 LAB — CBC WITH DIFFERENTIAL (CANCER CENTER ONLY)
Abs Immature Granulocytes: 0 10*3/uL (ref 0.00–0.07)
Basophils Absolute: 0 10*3/uL (ref 0.0–0.1)
Basophils Relative: 1 %
Eosinophils Absolute: 0.1 10*3/uL (ref 0.0–0.5)
Eosinophils Relative: 3 %
HCT: 41.6 % (ref 36.0–46.0)
Hemoglobin: 13.9 g/dL (ref 12.0–15.0)
Immature Granulocytes: 0 %
Lymphocytes Relative: 13 %
Lymphs Abs: 0.6 10*3/uL — ABNORMAL LOW (ref 0.7–4.0)
MCH: 28.8 pg (ref 26.0–34.0)
MCHC: 33.4 g/dL (ref 30.0–36.0)
MCV: 86.3 fL (ref 80.0–100.0)
Monocytes Absolute: 0.5 10*3/uL (ref 0.1–1.0)
Monocytes Relative: 11 %
Neutro Abs: 3 10*3/uL (ref 1.7–7.7)
Neutrophils Relative %: 72 %
Platelet Count: 179 10*3/uL (ref 150–400)
RBC: 4.82 MIL/uL (ref 3.87–5.11)
RDW: 14.7 % (ref 11.5–15.5)
WBC Count: 4.2 10*3/uL (ref 4.0–10.5)
nRBC: 0 % (ref 0.0–0.2)

## 2023-07-10 LAB — CMP (CANCER CENTER ONLY)
ALT: 15 U/L (ref 0–44)
AST: 18 U/L (ref 15–41)
Albumin: 4.6 g/dL (ref 3.5–5.0)
Alkaline Phosphatase: 74 U/L (ref 38–126)
Anion gap: 6 (ref 5–15)
BUN: 11 mg/dL (ref 8–23)
CO2: 31 mmol/L (ref 22–32)
Calcium: 9.6 mg/dL (ref 8.9–10.3)
Chloride: 103 mmol/L (ref 98–111)
Creatinine: 0.57 mg/dL (ref 0.44–1.00)
GFR, Estimated: >60 mL/min (ref >60)
Glucose, Bld: 108 mg/dL — ABNORMAL HIGH (ref 70–99)
Potassium: 4.2 mmol/L (ref 3.5–5.1)
Sodium: 140 mmol/L (ref 135–145)
Total Bilirubin: 0.7 mg/dL (ref 0.0–1.2)
Total Protein: 6.8 g/dL (ref 6.5–8.1)

## 2023-07-10 MED ORDER — TEMOZOLOMIDE 100 MG PO CAPS: 100.0000 mg | ORAL_CAPSULE | Freq: Every day | ORAL | 0 refills | Status: DC

## 2023-07-10 MED ORDER — TEMOZOLOMIDE 140 MG PO CAPS: 140.0000 mg | ORAL_CAPSULE | Freq: Every day | ORAL | 0 refills | Status: DC

## 2023-07-10 NOTE — Telephone Encounter (Signed)
 Left detailed message of appt details. Informed patient to call back if appointments do not work.

## 2023-07-10 NOTE — Progress Notes (Signed)
 [facility] [address]  [place], [place] [address] [phone]   Interval Evaluation  Date of Service: 07/10/23 Patient Name: Donna Barber Patient MRN: [id number] Patient DOB: June 29, 1950 Provider: Henreitta Leber, MD  Identifying Statement:  Donna Barber is a 73 y.o. female with right parietal glioblastoma    Oncologic History: Oncology History  Glioblastoma, IDH-wildtype (HCC)  07/25/2022 Surgery   Craniotomy, right parietal resection with Dr. Shaune Pollack.  Path demonstrates glioblastoma, IDH wild type.   08/29/2022 - 10/11/2022 Radiation Therapy   Completes 6 weeks IMRT and concurrent Temodar Basilio [place])   11/22/2022 -  Chemotherapy   Initiates 5-day Temodar      Biomarkers:  MGMT Unknown.  IDH 1/2 Wild type.  EGFR Unknown  TERT Unknown   Interval History: Donna Barber presents for follow up, now having completed cycle #8 5-day Temodar.  No issues with the chemotherapy this cycle.  No new or progressive changes today.  Denies seizures, headaches.  Still has some dizziness and imbalance.    Prior: Unfortunately she had a provoked fall on 11/25 which resulted in a fracture to the humerus near the shoulder joint on the left side.  She is currently in a sling and will visit with ortho next week for next steps.  Pain is still 7 or 8 at this time, dosing oxycodone PRN.  Otherwise no new deficits.  Maintains functional independence, no worsening of left sided weakness.   H+P (08/16/22) Patient presents to review recent brain tumor diagnosis.  She presented to medical attention in March 2024 with several days history of new onset severe headache.  CNS imaging demonstrated large enhancing mass in right thalamus and occipital lobe.  She underwent craniotomy, resection with Dr. Shaune Pollack on 07/25/22 at [facility]; path demonstrated glioblastoma IDH wild type.  Following surgery, her left arm and leg were weak, sche completed two weeks of rehab and regained  most of her prior function.  Right now has impaired vision on the left, and some weakness of the left arm.  Otherwise functionally independent.  Medications: Current Outpatient Medications on File Prior to Visit  Medication Sig Dispense Refill   acetaminophen (TYLENOL) 325 MG tablet Take 650 mg by mouth every 6 (six) hours as needed.     ALPRAZolam (XANAX) 0.25 MG tablet Take 0.25 mg by mouth at bedtime as needed for anxiety.     dexamethasone (DECADRON) 1 MG tablet Take 1 tablet (1 mg total) by mouth daily. 60 tablet 1   famotidine (PEPCID) 20 MG tablet TAKE 1 TABLET BY MOUTH AT BEDTIME 30 tablet 2   HYDROcodone-acetaminophen (NORCO/VICODIN) 5-325 MG tablet Take 1 tablet by mouth every 6 (six) hours as needed.     lacosamide (VIMPAT) 50 MG TABS tablet Take 1 tablet (50 mg total) by mouth 2 (two) times daily. 60 tablet 2   naproxen (NAPROSYN) 500 MG tablet Take 1 tablet (500 mg total) by mouth every 6 (six) hours as needed for headache. 30 tablet 0   ondansetron (ZOFRAN) 8 MG tablet Take 1 tablet (8 mg total) by mouth every 8 (eight) hours as needed for nausea or vomiting. 30 tablet 1   oxyCODONE-acetaminophen (PERCOCET/ROXICET) 5-325 MG tablet Take 1 tablet by mouth every 6 (six) hours as needed for severe pain (pain score 7-10). 12 tablet 0   pantoprazole (PROTONIX) 40 MG tablet TAKE 1 TABLET BY MOUTH 2 TIMES A DAY 30 tablet 2   prochlorperazine (COMPAZINE) 10 MG tablet  Take 1 tablet (10 mg total) by mouth every 6 (six) hours as needed. 30 tablet 2   temozolomide (TEMODAR) 100 MG capsule Take 1 capsule (100 mg total) by mouth daily. (Take with 140 mg capsules for total daily dose of 240 mg). May take on an empty stomach to decrease nausea & vomiting. 5 capsule 0   temozolomide (TEMODAR) 100 MG capsule Take 1 capsule (100 mg total) by mouth daily. (Take with 140 mg capsules for total daily dose of 240 mg). May take on an empty stomach to decrease nausea & vomiting. 5 capsule 0   temozolomide  (TEMODAR) 140 MG capsule Take 1 capsule (140 mg total) by mouth daily. (Take with 100 mg capsules for total daily dose of 240 mg). May take on an empty stomach to decrease nausea & vomiting. 5 capsule 0   temozolomide (TEMODAR) 140 MG capsule Take 1 capsule (140 mg total) by mouth daily. (Take with 100 mg capsules for total daily dose of 240 mg). May take on an empty stomach to decrease nausea & vomiting. 5 capsule 0   traZODone (DESYREL) 100 MG tablet TAKE 1 TABLET BY MOUTH AT BEDTIME 30 tablet 3   No current facility-administered medications on file prior to visit.    Allergies:  Allergies  Allergen Reactions   Sulfa Antibiotics Nausea And Vomiting   Sulfur Nausea And Vomiting and Nausea Only    Other Reaction(s): GI Intolerance   Codeine Nausea And Vomiting   Elemental Sulfur    Past Medical History:  Past Medical History:  Diagnosis Date   Abnormal Pap smear 2000   Cin-3 treated with LEEP   Canker sores oral    Depression    Dyspareunia    secondary to  atrophy   GERD (gastroesophageal reflux disease)    History of chicken pox    History of measles, mumps, or rubella    Postmenopausal    Uterine fibroid    Vaginal atrophy    Yeast infection    Past Surgical History:  Past Surgical History:  Procedure Laterality Date   CARPAL TUNNEL RELEASE     CRANIECTOMY / CRANIOTOMY FOR EXCISION OF BRAIN TUMOR  07/25/2022   DILATION AND CURETTAGE OF UTERUS     shoulder sugery     TUBAL LIGATION     bilateral   Social History:  Social History   Socioeconomic History   Marital status: Married    Spouse name: Not on file   Number of children: Not on file   Years of education: Not on file   Highest education level: Not on file  Occupational History   Not on file  Tobacco Use   Smoking status: Never   Smokeless tobacco: Never  Vaping Use   Vaping status: Never Used  Substance and Sexual Activity   Alcohol use: Yes   Drug use: No   Sexual activity: Yes    Birth  control/protection: Surgical  Other Topics Concern   Not on file  Social History Narrative   Not on file   Social Drivers of Health   Financial Resource Strain: Low Risk  (01/04/2022)   [facility]   Overall Financial Resource Strain (CARDIA)    Difficulty of Paying Living Expenses: Not hard at all  Food Insecurity: No Food Insecurity (08/16/2022)   Hunger Vital Sign    Worried About Running Out of Food in the Last Year: Never true    Ran Out of Food  in the Last Year: Never true  Transportation Needs: No Transportation Needs (08/16/2022)   PRAPARE - Administrator, Civil Service (Medical): No    Lack of Transportation (Non-Medical): No  Physical Activity: Sufficiently Active (01/04/2022)   [facility]   Exercise Vital Sign    Days of Exercise per Week: 6 days    Minutes of Exercise per Session: 80 min  Stress: No Stress Concern Present (01/04/2022)   Received from [facility], [facility], [facility]   [facility] of Occupational Health - Occupational Stress Questionnaire    Feeling of Stress : Only a little  Social Connections: Unknown (01/05/2023)   Received from [facility]   Social Network    Social Network: Not on file  Intimate Partner Violence: Not At Risk (04/06/2023)   Received from [facility]   HITS    Over the last 12 months how often did your partner physically hurt you?: Never    Over the last 12 months how often did your partner insult you or talk down to you?: Never    Over the last 12 months how often did your partner threaten you with physical harm?: Never    Over the last 12 months how often did your partner scream or curse at you?: Never   Family History:  Family History  Problem Relation Age of Onset   Hypertension Mother    Diabetes Mother    Heart disease Mother    Cancer Brother     Review of Systems: Constitutional: Doesn't  report fevers, chills or abnormal weight loss Eyes: Doesn't report blurriness of vision Ears, nose, mouth, throat, and face: Doesn't report sore throat Respiratory: Doesn't report cough, dyspnea or wheezes Cardiovascular: Doesn't report palpitation, chest discomfort  Gastrointestinal:  Doesn't report nausea, constipation, diarrhea GU: Doesn't report incontinence Skin: Doesn't report skin rashes Neurological: Per HPI Musculoskeletal: Doesn't report joint pain Behavioral/Psych: Doesn't report anxiety  Physical Exam: Vitals:   07/10/23 1437  BP: (!) 157/75  Pulse: 72  Resp: 18  Temp: 98.7 F (37.1 C)  SpO2: 99%    KPS: 70. General: Alert, cooperative, pleasant, in no acute distress Head: Normal EENT: No conjunctival injection or scleral icterus.  Lungs: Resp effort normal Cardiac: Regular rate Abdomen: Non-distended abdomen Skin: No rashes cyanosis or petechiae. Extremities: Left arm sling, impaired mobility  Neurologic Exam: Mental Status: Awake, alert, attentive to examiner. Oriented to self and environment. Language is fluent with intact comprehension.  Cranial Nerves: Visual acuity is grossly normal. Left hemianopia noted. Extra-ocular movements intact. No ptosis. Face is symmetric Motor: Tone and bulk are normal. Power is 4/5 in left arm, 4+/5 in left leg. Reflexes are symmetric, no pathologic reflexes present.  Sensory: Intact to light touch Gait: Hemiparetic   Labs: I have reviewed the data as listed    Component Value Date/Time   NA 141 06/26/2023 0930   K 3.9 06/26/2023 0930   CL 103 06/26/2023 0930   CO2 34 (H) 06/26/2023 0930   GLUCOSE 88 06/26/2023 0930   BUN 11 06/26/2023 0930   CREATININE 0.61 06/26/2023 0930   CALCIUM 9.4 06/26/2023 0930   PROT 6.6 06/26/2023 0930   ALBUMIN 4.5 06/26/2023 0930   AST 15 06/26/2023 0930   ALT 13 06/26/2023 0930   ALKPHOS 69 06/26/2023 0930   BILITOT 0.5 06/26/2023 0930   GFRNONAA >60 06/26/2023 0930   Lab Results   Component Value Date   WBC 4.2  07/10/2023   NEUTROABS 3.0 07/10/2023   HGB 13.9 07/10/2023   HCT 41.6 07/10/2023   MCV 86.3 07/10/2023   PLT 179 07/10/2023    Imaging:  [facility] Clinician Interpretation: I have personally reviewed the CNS images as listed.  My interpretation, in the context of the patient's clinical presentation, is progressive disease  MR BRAIN W WO CONTRAST Result Date: 07/01/2023 CLINICAL DATA:  Provided history: Glioblastoma, IDH wild type. Brain/CNS neoplasm, assess treatment response. EXAM: MRI HEAD WITHOUT AND WITH CONTRAST TECHNIQUE: Multiplanar, multiecho pulse sequences of the brain and surrounding structures were obtained without and with intravenous contrast. CONTRAST:  5mL GADAVIST GADOBUTROL 1 MMOL/ML IV SOLN COMPARISON:  Prior brain MRI examinations 05/19/2023 and earlier. FINDINGS: Brain: Postoperative changes with right occipital lobe resection cavity. There has been a continued mild increase in thickness of enhancement along portions of the resection cavity (for instance comparing enhancement along the anterior aspect of the resection cavity on series 12, image 9 of the current exam with series 18, image 8 of the prior exam). Thick and irregular enhancement more anteriorly in the right temporal lobe, including ependymal enhancement along/within the temporal horn and along the atrium of the right lateral ventricle, has not significantly changed. Contiguous enhancement extending along portions of the right tentorium has not significantly changed. Confluent T2 FLAIR hyperintense signal abnormality within the right temporal, occipital and parietal lobes has mildly progressed, and associated mass effect has mildly progressed. 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 Unchanged small nonspecific foci of T2 FLAIR hyperintense signal abnormality within the left frontal and parietal lobe white matter. Unchanged dilation of the right lateral ventricle  temporal horn anteriorly. Trace extra-axial fluid adjacent to the right parietooccipital cranioplasty, unchanged. No midline shift. Vascular: Maintained flow voids within the proximal large arterial vessels. Skull and upper cervical spine: No focal worrisome marrow lesion. Right parietooccipital cranioplasty. Sinuses/Orbits: No mass or acute finding within the imaged orbits. No significant paranasal sinus disease. Impression #1 will be called to the ordering clinician or representative by the Radiologist Assistant, and communication documented in the PACS or [employer]. IMPRESSION: 1. Continued mild increase in thickness/bulk of enhancement along portions of the right occipital lobe resection cavity. Confluent T2 FLAIR hyperintense signal abnormality within the right temporal, occipital and parietal lobes has mildly progressed, and associated mass effect has mildly progressed. These findings are concerning for tumor progression. 2. No other significant change as compared to the brain MRI of 05/19/2023, as described within the body of the report. Electronically Signed   By: Jackey Loge D.O.   On: 07/01/2023 11:47    Assessment/Plan Glioblastoma, IDH-wildtype (HCC)  Donna Barber is clinically stable today, having completed cycle #8 of 5-day Temodar.  MRI brain demonstrates subtle increase in enhancing volume within primary site of disease, still asymptomatic and less than 10% change when compared to scan from 04/14/23.  Bulk of tumor is stable, etiology of change in enhancement is unclear.  We recommended continuing treatment with cycle #9 Temozolomide 150mg /m2, on for five days and off for twenty three days in twenty eight day cycles. The patient will have a complete blood count performed on days 21 and 28 of each cycle, and a comprehensive metabolic panel performed on day 28 of each cycle. Labs may need to be performed more often. Zofran will prescribed for home use for nausea/vomiting.   Cycle 9  should start on 07/29/23.  Chemotherapy should be held for the following:  ANC less than 1,000  Platelets less than 100,000  LFT or creatinine greater than 2x ULN  If clinical concerns/contraindications develop  Vimpat will con't 50mg  BID.  Decadron should formally discontinue.  Ok to continue Trazodone 100mg  HS as sleep aid.  We appreciate the opportunity to participate in the care of Donna Barber.    We ask that Donna Barber return to clinic in 1-2 months with MRI brain prior to cycle #10, or sooner as needed.    All questions were answered. The patient knows to call the clinic with any problems, questions or concerns. No barriers to learning were detected.  The total time spent in the encounter was 40 minutes and more than 50% was on counseling and review of test results   Henreitta Leber, MD Medical Director of Neuro-Oncology [facility] [place] 07/10/23 2:34 PM

## 2023-07-11 ENCOUNTER — Other Ambulatory Visit: Payer: Self-pay

## 2023-07-12 DIAGNOSIS — S42202D Unspecified fracture of upper end of left humerus, subsequent encounter for fracture with routine healing: Secondary | ICD-10-CM | POA: Diagnosis not present

## 2023-07-12 DIAGNOSIS — M25512 Pain in left shoulder: Secondary | ICD-10-CM | POA: Diagnosis not present

## 2023-07-19 DIAGNOSIS — M25512 Pain in left shoulder: Secondary | ICD-10-CM | POA: Diagnosis not present

## 2023-07-19 DIAGNOSIS — S42202D Unspecified fracture of upper end of left humerus, subsequent encounter for fracture with routine healing: Secondary | ICD-10-CM | POA: Diagnosis not present

## 2023-07-24 ENCOUNTER — Ambulatory Visit: Admitting: Internal Medicine

## 2023-07-24 ENCOUNTER — Other Ambulatory Visit

## 2023-07-26 DIAGNOSIS — S42202D Unspecified fracture of upper end of left humerus, subsequent encounter for fracture with routine healing: Secondary | ICD-10-CM | POA: Diagnosis not present

## 2023-07-26 DIAGNOSIS — M25512 Pain in left shoulder: Secondary | ICD-10-CM | POA: Diagnosis not present

## 2023-08-09 DIAGNOSIS — M25512 Pain in left shoulder: Secondary | ICD-10-CM | POA: Diagnosis not present

## 2023-08-09 DIAGNOSIS — S42202D Unspecified fracture of upper end of left humerus, subsequent encounter for fracture with routine healing: Secondary | ICD-10-CM | POA: Diagnosis not present

## 2023-08-14 ENCOUNTER — Other Ambulatory Visit: Payer: Self-pay | Admitting: Internal Medicine

## 2023-08-18 ENCOUNTER — Ambulatory Visit (HOSPITAL_COMMUNITY)
Admission: RE | Admit: 2023-08-18 | Discharge: 2023-08-18 | Disposition: A | Discharge status: Home / Self Care | Source: Ambulatory Visit | Attending: Internal Medicine

## 2023-08-18 ENCOUNTER — Other Ambulatory Visit: Payer: Self-pay | Admitting: *Deleted

## 2023-08-18 DIAGNOSIS — C719 Malignant neoplasm of brain, unspecified: Secondary | ICD-10-CM | POA: Diagnosis not present

## 2023-08-18 DIAGNOSIS — G9389 Other specified disorders of brain: Secondary | ICD-10-CM | POA: Diagnosis not present

## 2023-08-18 MED ORDER — GADOBUTROL 1 MMOL/ML IV SOLN
5.0000 mL | Freq: Once | INTRAVENOUS | Status: AC | PRN
  Administered 2023-08-18: 5 mL via INTRAVENOUS

## 2023-08-21 ENCOUNTER — Inpatient Hospital Stay: Attending: Internal Medicine

## 2023-08-21 ENCOUNTER — Inpatient Hospital Stay: Admitting: Internal Medicine

## 2023-08-21 VITALS — BP 180/66 | HR 71 | Temp 97.7°F | Resp 18 | Wt 119.9 lb

## 2023-08-21 DIAGNOSIS — C719 Malignant neoplasm of brain, unspecified: Secondary | ICD-10-CM

## 2023-08-21 DIAGNOSIS — C713 Malignant neoplasm of parietal lobe: Secondary | ICD-10-CM

## 2023-08-21 DIAGNOSIS — Z79899 Other long term (current) drug therapy: Secondary | ICD-10-CM | POA: Insufficient documentation

## 2023-08-21 LAB — CMP (CANCER CENTER ONLY)
ALT: 13 U/L (ref 0–44)
AST: 16 U/L (ref 15–41)
Albumin: 4.8 g/dL (ref 3.5–5.0)
Alkaline Phosphatase: 77 U/L (ref 38–126)
Anion gap: 6 (ref 5–15)
BUN: 8 mg/dL (ref 8–23)
CO2: 30 mmol/L (ref 22–32)
Calcium: 9.5 mg/dL (ref 8.9–10.3)
Chloride: 104 mmol/L (ref 98–111)
Creatinine: 0.56 mg/dL (ref 0.44–1.00)
GFR, Estimated: >60 mL/min (ref >60)
Glucose, Bld: 95 mg/dL (ref 70–99)
Potassium: 3.7 mmol/L (ref 3.5–5.1)
Sodium: 140 mmol/L (ref 135–145)
Total Bilirubin: 0.6 mg/dL (ref 0.0–1.2)
Total Protein: 7 g/dL (ref 6.5–8.1)

## 2023-08-21 LAB — CBC WITH DIFFERENTIAL (CANCER CENTER ONLY)
Abs Immature Granulocytes: 0.01 10*3/uL (ref 0.00–0.07)
Basophils Absolute: 0.1 10*3/uL (ref 0.0–0.1)
Basophils Relative: 1 %
Eosinophils Absolute: 0.1 10*3/uL (ref 0.0–0.5)
Eosinophils Relative: 1 %
HCT: 40.9 % (ref 36.0–46.0)
Hemoglobin: 13.8 g/dL (ref 12.0–15.0)
Immature Granulocytes: 0 %
Lymphocytes Relative: 15 %
Lymphs Abs: 0.7 10*3/uL (ref 0.7–4.0)
MCH: 29.2 pg (ref 26.0–34.0)
MCHC: 33.7 g/dL (ref 30.0–36.0)
MCV: 86.7 fL (ref 80.0–100.0)
Monocytes Absolute: 0.5 10*3/uL (ref 0.1–1.0)
Monocytes Relative: 10 %
Neutro Abs: 3.4 10*3/uL (ref 1.7–7.7)
Neutrophils Relative %: 73 %
Platelet Count: 255 10*3/uL (ref 150–400)
RBC: 4.72 MIL/uL (ref 3.87–5.11)
RDW: 14.6 % (ref 11.5–15.5)
WBC Count: 4.7 10*3/uL (ref 4.0–10.5)
nRBC: 0 % (ref 0.0–0.2)

## 2023-08-21 MED ORDER — TEMOZOLOMIDE 140 MG PO CAPS: 140.0000 mg | ORAL_CAPSULE | Freq: Every day | ORAL | 0 refills | Status: DC

## 2023-08-21 MED ORDER — TEMOZOLOMIDE 100 MG PO CAPS
100.0000 mg | ORAL_CAPSULE | Freq: Every day | ORAL | 0 refills | Status: DC
Start: 2023-08-21 — End: 2023-09-27

## 2023-08-21 NOTE — Progress Notes (Signed)
 [facility] at [facility] [address]  [place], [place] [address] [phone]   Interval Evaluation  Date of Service: 08/21/23 Patient Name: Donna Barber Patient MRN: [id number] Patient DOB: [birth year] Provider: Mamie Searles, MD  Identifying Statement:  BAHATI Barber is a 73 y.o. female with right parietal glioblastoma    Oncologic History: Oncology History  Glioblastoma, IDH-wildtype (HCC)  07/25/2022 Surgery   Craniotomy, right parietal resection with Dr. Henrietta Lofts.  Path demonstrates glioblastoma, IDH wild type.   08/29/2022 - 10/11/2022 Radiation Therapy   Completes 6 weeks IMRT and concurrent Temodar  Lurena Sally)   11/22/2022 -  Chemotherapy   Initiates 5-day Temodar       Biomarkers:  MGMT Unknown.  IDH 1/2 Wild type.  EGFR Unknown  TERT Unknown   Interval History: Donna Barber presents for follow up, now having completed cycle #9 5-day Temodar .  Continues to tolerated treatment well overall.  No new or progressive changes today.  Denies seizures, headaches.  Still has some dizziness and imbalance.    Prior: Unfortunately she had a provoked fall on 11/25 which resulted in a fracture to the humerus near the shoulder joint on the left side.  She is currently in a sling and will visit with ortho next week for next steps.  Pain is still 7 or 8 at this time, dosing oxycodone  PRN.  Otherwise no new deficits.  Maintains functional independence, no worsening of left sided weakness.   H+P (08/16/22) Patient presents to review recent brain tumor diagnosis.  She presented to medical attention in March 2024 with several days history of new onset severe headache.  CNS imaging demonstrated large enhancing mass in right thalamus and occipital lobe.  She underwent craniotomy, resection with Dr. Henrietta Lofts on 07/25/22 at [facility]; path demonstrated glioblastoma IDH wild type.  Following surgery, her left arm and leg were weak, sche completed two weeks of rehab and regained  most of her prior function.  Right now has impaired vision on the left, and some weakness of the left arm.  Otherwise functionally independent.  Medications: Current Outpatient Medications on File Prior to Visit  Medication Sig Dispense Refill   acetaminophen  (TYLENOL ) 325 MG tablet Take 650 mg by mouth every 6 (six) hours as needed.     ALPRAZolam (XANAX) 0.25 MG tablet Take 0.25 mg by mouth at bedtime as needed for anxiety.     famotidine  (PEPCID ) 20 MG tablet TAKE 1 TABLET BY MOUTH AT BEDTIME 30 tablet 2   lacosamide  (VIMPAT ) 50 MG TABS tablet Take 1 tablet (50 mg total) by mouth 2 (two) times daily. 60 tablet 2   naproxen  (NAPROSYN ) 500 MG tablet Take 1 tablet (500 mg total) by mouth every 6 (six) hours as needed for headache. 30 tablet 0   ondansetron  (ZOFRAN ) 8 MG tablet Take 1 tablet (8 mg total) by mouth every 8 (eight) hours as needed for nausea or vomiting. 30 tablet 1   pantoprazole  (PROTONIX ) 40 MG tablet TAKE 1 TABLET BY MOUTH 2 TIMES A DAY 30 tablet 2   prochlorperazine  (COMPAZINE ) 10 MG tablet Take 1 tablet (10 mg total) by mouth every 6 (six) hours as needed. 30 tablet 2   traZODone  (DESYREL ) 100 MG tablet TAKE 1 TABLET BY MOUTH AT BEDTIME 30 tablet 3   No current facility-administered medications on file prior to visit.    Allergies:  Allergies  Allergen Reactions   Sulfa Antibiotics Nausea And Vomiting   Sulfur Nausea And Vomiting  and Nausea Only    Other Reaction(s): GI Intolerance   Codeine Nausea And Vomiting   Elemental Sulfur    Past Medical History:  Past Medical History:  Diagnosis Date   Abnormal Pap smear 2000   Cin-3 treated with LEEP   Canker sores oral    Depression    Dyspareunia    secondary to  atrophy   GERD (gastroesophageal reflux disease)    History of chicken pox    History of measles, mumps, or rubella    Postmenopausal    Uterine fibroid    Vaginal atrophy    Yeast infection    Past Surgical History:  Past Surgical History:   Procedure Laterality Date   CARPAL TUNNEL RELEASE     CRANIECTOMY / CRANIOTOMY FOR EXCISION OF BRAIN TUMOR  07/25/2022   DILATION AND CURETTAGE OF UTERUS     shoulder sugery     TUBAL LIGATION     bilateral   Social History:  Social History   Socioeconomic History   Marital status: Married    Spouse name: Not on file   Number of children: Not on file   Years of education: Not on file   Highest education level: Not on file  Occupational History   Not on file  Tobacco Use   Smoking status: Never   Smokeless tobacco: Never  Vaping Use   Vaping status: Never Used  Substance and Sexual Activity   Alcohol  use: Yes   Drug use: No   Sexual activity: Yes    Birth control/protection: Surgical  Other Topics Concern   Not on file  Social History Narrative   Not on file   Social Drivers of Health   Financial Resource Strain: Low Risk  (01/04/2022)   [facility]   Overall Financial Resource Strain (CARDIA)    Difficulty of Paying Living Expenses: Not hard at all  Food Insecurity: No Food Insecurity (08/16/2022)   Hunger Vital Sign    Worried About Running Out of Food in the Last Year: Never true    Ran Out of Food in the Last Year: Never true  Transportation Needs: No Transportation Needs (08/16/2022)   PRAPARE - Administrator, Civil Service (Medical): No    Lack of Transportation (Non-Medical): No  Physical Activity: Sufficiently Active (01/04/2022)   [facility]   Exercise Vital Sign    Days of Exercise per Week: 6 days    Minutes of Exercise per Session: 80 min  Stress: No Stress Concern Present (01/04/2022)   [facility], [facility], [facility]   [facility] of Occupational Health - Occupational Stress Questionnaire    Feeling of Stress : Only a little  Social Connections: Unknown (01/05/2023)   Received from [facility]   Social Network     Social Network: Not on file  Intimate Partner Violence: Not At Risk (04/06/2023)   Received from [facility]   HITS    Over the last 12 months how often did your partner physically hurt you?: Never    Over the last 12 months how often did your partner insult you or talk down to you?: Never    Over the last 12 months how often did your partner threaten you with physical harm?: Never    Over the last 12 months how often did your partner scream or curse at you?: Never   Family History:  Family  History  Problem Relation Age of Onset   Hypertension Mother    Diabetes Mother    Heart disease Mother    Cancer Brother     Review of Systems: Constitutional: Doesn't report fevers, chills or abnormal weight loss Eyes: Doesn't report blurriness of vision Ears, nose, mouth, throat, and face: Doesn't report sore throat Respiratory: Doesn't report cough, dyspnea or wheezes Cardiovascular: Doesn't report palpitation, chest discomfort  Gastrointestinal:  Doesn't report nausea, constipation, diarrhea GU: Doesn't report incontinence Skin: Doesn't report skin rashes Neurological: Per HPI Musculoskeletal: Doesn't report joint pain Behavioral/Psych: Doesn't report anxiety  Physical Exam: Vitals:   08/21/23 1344 08/21/23 1345  BP: (!) 166/97 (!) 180/66  Pulse: 71   Resp: 18   Temp: 97.7 F (36.5 C)   SpO2: 100%     KPS: 70. General: Alert, cooperative, pleasant, in no acute distress Head: Normal EENT: No conjunctival injection or scleral icterus.  Lungs: Resp effort normal Cardiac: Regular rate Abdomen: Non-distended abdomen Skin: No rashes cyanosis or petechiae. Extremities: Left arm sling, impaired mobility  Neurologic Exam: Mental Status: Awake, alert, attentive to examiner. Oriented to self and environment. Language is fluent with intact comprehension.  Cranial Nerves: Visual acuity is grossly normal. Left hemianopia noted. Extra-ocular movements intact. No ptosis. Face is  symmetric Motor: Tone and bulk are normal. Power is 4/5 in left arm, 4+/5 in left leg. Reflexes are symmetric, no pathologic reflexes present.  Sensory: Intact to light touch Gait: Hemiparetic   Labs: I have reviewed the data as listed    Component Value Date/Time   NA 140 08/21/2023 1253   K 3.7 08/21/2023 1253   CL 104 08/21/2023 1253   CO2 30 08/21/2023 1253   GLUCOSE 95 08/21/2023 1253   BUN 8 08/21/2023 1253   CREATININE 0.56 08/21/2023 1253   CALCIUM 9.5 08/21/2023 1253   PROT 7.0 08/21/2023 1253   ALBUMIN 4.8 08/21/2023 1253   AST 16 08/21/2023 1253   ALT 13 08/21/2023 1253   ALKPHOS 77 08/21/2023 1253   BILITOT 0.6 08/21/2023 1253   GFRNONAA >60 08/21/2023 1253   Lab Results  Component Value Date   WBC 4.7 08/21/2023   NEUTROABS 3.4 08/21/2023   HGB 13.8 08/21/2023   HCT 40.9 08/21/2023   MCV 86.7 08/21/2023   PLT 255 08/21/2023    Imaging:  [facility] Clinician Interpretation: I have personally reviewed the CNS images as listed.  My interpretation, in the context of the patient's clinical presentation, is stable disease pending official read  No results found.   Assessment/Plan Glioblastoma, IDH-wildtype (HCC) - Plan: temozolomide  (TEMODAR ) 100 MG capsule, temozolomide  (TEMODAR ) 140 MG capsule  Reighlynn W Shiffman is clinically stable today, having completed cycle #9 of 5-day Temodar .  MRI brain demonstrates overall stable burden of enhancement when compared to prior study.  Small nodular focus within anterior temporal lobe is slightly more prominent, but not clearly increased in cross sectional diameter.  Official read remains pending at this time.  We recommended continuing treatment with cycle #10 Temozolomide  150mg /m2, on for five days and off for twenty three days in twenty eight day cycles. The patient will have a complete blood count performed on days 21 and 28 of each cycle, and a comprehensive metabolic panel performed on day 28 of each cycle. Labs may need to  be performed more often. Zofran  will prescribed for home use for nausea/vomiting.   Chemotherapy should be held for the following:  ANC less than 1,000  Platelets less than 100,000  LFT or creatinine greater than 2x ULN  If clinical concerns/contraindications develop  Vimpat  will con't 50mg  BID.  Decadron  should remain off if tolerated.  Ok to continue Trazodone  100mg  HS as sleep aid.  We appreciate the opportunity to participate in the care of DANIELLEMARIE NIKLAS.    We ask that Zahava W Shontz return to clinic in 1 months with labs prior to cycle #11, or sooner as needed.    All questions were answered. The patient knows to call the clinic with any problems, questions or concerns. No barriers to learning were detected.  The total time spent in the encounter was 40 minutes and more than 50% was on counseling and review of test results   Mamie Searles, MD Medical Director of Neuro-Oncology [facility] [place] 08/21/23 3:58 PM

## 2023-08-23 ENCOUNTER — Telehealth: Payer: Self-pay | Admitting: Internal Medicine

## 2023-08-23 DIAGNOSIS — M25512 Pain in left shoulder: Secondary | ICD-10-CM | POA: Diagnosis not present

## 2023-08-23 DIAGNOSIS — S42202D Unspecified fracture of upper end of left humerus, subsequent encounter for fracture with routine healing: Secondary | ICD-10-CM | POA: Diagnosis not present

## 2023-08-23 NOTE — Telephone Encounter (Signed)
 Donna Barber scheduled her appointments and she is aware of all appointment details.

## 2023-08-24 ENCOUNTER — Other Ambulatory Visit: Payer: Self-pay

## 2023-08-30 DIAGNOSIS — H5203 Hypermetropia, bilateral: Secondary | ICD-10-CM | POA: Diagnosis not present

## 2023-08-30 DIAGNOSIS — S42202D Unspecified fracture of upper end of left humerus, subsequent encounter for fracture with routine healing: Secondary | ICD-10-CM | POA: Diagnosis not present

## 2023-08-30 DIAGNOSIS — M25512 Pain in left shoulder: Secondary | ICD-10-CM | POA: Diagnosis not present

## 2023-09-06 ENCOUNTER — Telehealth: Payer: Self-pay | Admitting: *Deleted

## 2023-09-06 NOTE — Telephone Encounter (Signed)
 Returned PC to patient - informed her Dr Mark Sil is fine with her delaying her temodar  start until after her vacation.  Instructed patient to keep her appointment on June 2.  She verbalizes understanding.

## 2023-09-06 NOTE — Telephone Encounter (Signed)
-----   Message from Mamie Searles sent at 09/06/2023  2:53 PM EDT ----- Regarding: RE: Absolutely we can delay.  Psychotropic medications need to be discussed during a clinic visit ----- Message ----- From: Priscille Brought, RN Sent: 09/06/2023   2:52 PM EDT To: Zachary K Vaslow, MD  Ms Sanon daughter called today, I also ended up talking with the patient -  she has an appointment with you on June 2 & will presumably start her temodar  cycle shortly after.  She has a beach trip from June 14-21 and is asking if her cycle can be delayed until after this trip.  The daughter is also asking for suggestions for a medication or an herbal supplement that may help her mood, she says the patient is very sad & emotional especially in the morning.

## 2023-09-13 DIAGNOSIS — M25512 Pain in left shoulder: Secondary | ICD-10-CM | POA: Diagnosis not present

## 2023-09-13 DIAGNOSIS — S42202D Unspecified fracture of upper end of left humerus, subsequent encounter for fracture with routine healing: Secondary | ICD-10-CM | POA: Diagnosis not present

## 2023-09-15 ENCOUNTER — Other Ambulatory Visit: Payer: Self-pay

## 2023-09-15 ENCOUNTER — Encounter: Payer: Self-pay | Admitting: Internal Medicine

## 2023-09-15 ENCOUNTER — Other Ambulatory Visit: Payer: Self-pay | Admitting: *Deleted

## 2023-09-15 DIAGNOSIS — C719 Malignant neoplasm of brain, unspecified: Secondary | ICD-10-CM

## 2023-09-16 ENCOUNTER — Other Ambulatory Visit: Payer: Self-pay | Admitting: Internal Medicine

## 2023-09-16 DIAGNOSIS — E7800 Pure hypercholesterolemia, unspecified: Secondary | ICD-10-CM | POA: Diagnosis not present

## 2023-09-18 ENCOUNTER — Inpatient Hospital Stay: Attending: Internal Medicine

## 2023-09-18 ENCOUNTER — Encounter: Payer: Self-pay | Admitting: Internal Medicine

## 2023-09-18 ENCOUNTER — Inpatient Hospital Stay: Admitting: Internal Medicine

## 2023-09-18 VITALS — BP 158/62 | HR 69 | Temp 98.1°F | Resp 17 | Wt 119.2 lb

## 2023-09-18 DIAGNOSIS — Z923 Personal history of irradiation: Secondary | ICD-10-CM | POA: Insufficient documentation

## 2023-09-18 DIAGNOSIS — Z7189 Other specified counseling: Secondary | ICD-10-CM | POA: Diagnosis not present

## 2023-09-18 DIAGNOSIS — Z79899 Other long term (current) drug therapy: Secondary | ICD-10-CM | POA: Diagnosis not present

## 2023-09-18 DIAGNOSIS — C713 Malignant neoplasm of parietal lobe: Secondary | ICD-10-CM | POA: Diagnosis not present

## 2023-09-18 DIAGNOSIS — C719 Malignant neoplasm of brain, unspecified: Secondary | ICD-10-CM

## 2023-09-18 LAB — CBC WITH DIFFERENTIAL (CANCER CENTER ONLY)
Abs Immature Granulocytes: 0 10*3/uL (ref 0.00–0.07)
Basophils Absolute: 0 10*3/uL (ref 0.0–0.1)
Basophils Relative: 1 %
Eosinophils Absolute: 0.1 10*3/uL (ref 0.0–0.5)
Eosinophils Relative: 2 %
HCT: 40.2 % (ref 36.0–46.0)
Hemoglobin: 13.9 g/dL (ref 12.0–15.0)
Immature Granulocytes: 0 %
Lymphocytes Relative: 18 %
Lymphs Abs: 0.6 10*3/uL — ABNORMAL LOW (ref 0.7–4.0)
MCH: 30.2 pg (ref 26.0–34.0)
MCHC: 34.6 g/dL (ref 30.0–36.0)
MCV: 87.4 fL (ref 80.0–100.0)
Monocytes Absolute: 0.4 10*3/uL (ref 0.1–1.0)
Monocytes Relative: 11 %
Neutro Abs: 2.4 10*3/uL (ref 1.7–7.7)
Neutrophils Relative %: 68 %
Platelet Count: 218 10*3/uL (ref 150–400)
RBC: 4.6 MIL/uL (ref 3.87–5.11)
RDW: 13.7 % (ref 11.5–15.5)
WBC Count: 3.5 10*3/uL — ABNORMAL LOW (ref 4.0–10.5)
nRBC: 0 % (ref 0.0–0.2)

## 2023-09-18 LAB — CMP (CANCER CENTER ONLY)
ALT: 10 U/L (ref 0–44)
AST: 16 U/L (ref 15–41)
Albumin: 4.6 g/dL (ref 3.5–5.0)
Alkaline Phosphatase: 73 U/L (ref 38–126)
Anion gap: 6 (ref 5–15)
BUN: 9 mg/dL (ref 8–23)
CO2: 27 mmol/L (ref 22–32)
Calcium: 9.4 mg/dL (ref 8.9–10.3)
Chloride: 106 mmol/L (ref 98–111)
Creatinine: 0.62 mg/dL (ref 0.44–1.00)
GFR, Estimated: >60 mL/min (ref >60)
Glucose, Bld: 117 mg/dL — ABNORMAL HIGH (ref 70–99)
Potassium: 4 mmol/L (ref 3.5–5.1)
Sodium: 139 mmol/L (ref 135–145)
Total Bilirubin: 0.7 mg/dL (ref 0.0–1.2)
Total Protein: 6.7 g/dL (ref 6.5–8.1)

## 2023-09-18 MED ORDER — TEMOZOLOMIDE 100 MG PO CAPS: 100.0000 mg | ORAL_CAPSULE | Freq: Every day | ORAL | 0 refills | Status: DC

## 2023-09-18 MED ORDER — TEMOZOLOMIDE 140 MG PO CAPS: 140.0000 mg | ORAL_CAPSULE | Freq: Every day | ORAL | 0 refills | Status: DC

## 2023-09-18 NOTE — Progress Notes (Signed)
 [facility] at [facility] [address]  [place], [place] [address] [phone]   Interval Evaluation  Date of Service: 09/18/23 Patient Name: Donna Barber Patient MRN: [id number] Patient DOB: [birth year] Provider: Mamie Searles, MD  Identifying Statement:  Donna Barber is a 73 y.o. female with right parietal glioblastoma    Oncologic History: Oncology History  Glioblastoma, IDH-wildtype (HCC)  07/25/2022 Surgery   Craniotomy, right parietal resection with Dr. Henrietta Lofts.  Path demonstrates glioblastoma, IDH wild type.   08/29/2022 - 10/11/2022 Radiation Therapy   Completes 6 weeks IMRT and concurrent Temodar  Donna Barber)   11/22/2022 -  Chemotherapy   Initiates 5-day Temodar       Biomarkers:  MGMT Unknown.  IDH 1/2 Wild type.  EGFR Unknown  TERT Unknown   Interval History: Donna Barber presents for follow up, now having completed cycle #10 5-day Temodar .  Continues to tolerated treatment well overall.  No new or progressive changes today.  Denies seizures, headaches.  Still has some dizziness and imbalance.    Prior: Unfortunately she had a provoked fall on 11/25 which resulted in a fracture to the humerus near the shoulder joint on the left side.  She is currently in a sling and will visit with ortho next week for next steps.  Pain is still 7 or 8 at this time, dosing oxycodone  PRN.  Otherwise no new deficits.  Maintains functional independence, no worsening of left sided weakness.   H+P (08/16/22) Patient presents to review recent brain tumor diagnosis.  She presented to medical attention in March 2024 with several days history of new onset severe headache.  CNS imaging demonstrated large enhancing mass in right thalamus and occipital lobe.  She underwent craniotomy, resection with Dr. Henrietta Lofts on 07/25/22 at [facility]; path demonstrated glioblastoma IDH wild type.  Following surgery, her left arm and leg were weak, sche completed two weeks of rehab and  regained most of her prior function.  Right now has impaired vision on the left, and some weakness of the left arm.  Otherwise functionally independent.  Medications: Current Outpatient Medications on File Prior to Visit  Medication Sig Dispense Refill   acetaminophen  (TYLENOL ) 325 MG tablet Take 650 mg by mouth every 6 (six) hours as needed.     ALPRAZolam (XANAX) 0.25 MG tablet Take 0.25 mg by mouth at bedtime as needed for anxiety.     famotidine  (PEPCID ) 20 MG tablet TAKE 1 TABLET BY MOUTH AT BEDTIME 30 tablet 2   lacosamide  (VIMPAT ) 50 MG TABS tablet TAKE 1 TABLET BY MOUTH 2 TIMES A DAY 60 tablet 3   loratadine (CLARITIN) 10 MG tablet Take 10 mg by mouth daily as needed for allergies.     naproxen  (NAPROSYN ) 500 MG tablet Take 1 tablet (500 mg total) by mouth every 6 (six) hours as needed for headache. 30 tablet 0   ondansetron  (ZOFRAN ) 8 MG tablet Take 1 tablet (8 mg total) by mouth every 8 (eight) hours as needed for nausea or vomiting. 30 tablet 1   pantoprazole  (PROTONIX ) 40 MG tablet TAKE 1 TABLET BY MOUTH 2 TIMES A DAY 30 tablet 2   prochlorperazine  (COMPAZINE ) 10 MG tablet Take 1 tablet (10 mg total) by mouth every 6 (six) hours as needed. 30 tablet 2   temozolomide  (TEMODAR ) 100 MG capsule Take 1 capsule (100 mg total) by mouth daily. (Take with 140 mg capsules for total daily dose of 240 mg). May take on an empty stomach  to decrease nausea & vomiting. 5 capsule 0   temozolomide  (TEMODAR ) 140 MG capsule Take 1 capsule (140 mg total) by mouth daily. (Take with 100 mg capsules for total daily dose of 240 mg). May take on an empty stomach to decrease nausea & vomiting. 5 capsule 0   traZODone  (DESYREL ) 100 MG tablet TAKE 1 TABLET BY MOUTH AT BEDTIME 30 tablet 3   No current facility-administered medications on file prior to visit.    Allergies:  Allergies  Allergen Reactions   Sulfa Antibiotics Nausea And Vomiting   Sulfur Nausea And Vomiting and Nausea Only    Other Reaction(s):  GI Intolerance   Codeine Nausea And Vomiting   Elemental Sulfur    Past Medical History:  Past Medical History:  Diagnosis Date   Abnormal Pap smear 2000   Cin-3 treated with LEEP   Canker sores oral    Depression    Dyspareunia    secondary to  atrophy   GERD (gastroesophageal reflux disease)    History of chicken pox    History of measles, mumps, or rubella    Postmenopausal    Uterine fibroid    Vaginal atrophy    Yeast infection    Past Surgical History:  Past Surgical History:  Procedure Laterality Date   CARPAL TUNNEL RELEASE     CRANIECTOMY / CRANIOTOMY FOR EXCISION OF BRAIN TUMOR  07/25/2022   DILATION AND CURETTAGE OF UTERUS     shoulder sugery     TUBAL LIGATION     bilateral   Social History:  Social History   Socioeconomic History   Marital status: Married    Spouse name: Not on file   Number of children: Not on file   Years of education: Not on file   Highest education level: Not on file  Occupational History   Not on file  Tobacco Use   Smoking status: Never   Smokeless tobacco: Never  Vaping Use   Vaping status: Never Used  Substance and Sexual Activity   Alcohol  use: Yes   Drug use: No   Sexual activity: Yes    Birth control/protection: Surgical  Other Topics Concern   Not on file  Social History Narrative   Not on file   Social Drivers of Health   Financial Resource Strain: Low Risk  (01/04/2022)   [facility]   Overall Financial Resource Strain (CARDIA)    Difficulty of Paying Living Expenses: Not hard at all  Food Insecurity: No Food Insecurity (08/16/2022)   Hunger Vital Sign    Worried About Running Out of Food in the Last Year: Never true    Ran Out of Food in the Last Year: Never true  Transportation Needs: No Transportation Needs (08/16/2022)   PRAPARE - Administrator, Civil Service (Medical): No    Lack of Transportation (Non-Medical): No  Physical Activity:  Sufficiently Active (01/04/2022)   [facility]   Exercise Vital Sign    Days of Exercise per Week: 6 days    Minutes of Exercise per Session: 80 min  Stress: No Stress Concern Present (01/04/2022)   Received from [facility], [facility], [facility]   [facility] of Occupational Health - Occupational Stress Questionnaire    Feeling of Stress : Only a little  Social Connections: Unknown (01/05/2023)   Received from [facility]   Social Network    Social Network: Not on  file  Intimate Partner Violence: Not At Risk (04/06/2023)   Received from [facility]   HITS    Over the last 12 months how often did your partner physically hurt you?: Never    Over the last 12 months how often did your partner insult you or talk down to you?: Never    Over the last 12 months how often did your partner threaten you with physical harm?: Never    Over the last 12 months how often did your partner scream or curse at you?: Never   Family History:  Family History  Problem Relation Age of Onset   Hypertension Mother    Diabetes Mother    Heart disease Mother    Cancer Brother     Review of Systems: Constitutional: Doesn't report fevers, chills or abnormal weight loss Eyes: Doesn't report blurriness of vision Ears, nose, mouth, throat, and face: Doesn't report sore throat Respiratory: Doesn't report cough, dyspnea or wheezes Cardiovascular: Doesn't report palpitation, chest discomfort  Gastrointestinal:  Doesn't report nausea, constipation, diarrhea GU: Doesn't report incontinence Skin: Doesn't report skin rashes Neurological: Per HPI Musculoskeletal: Doesn't report joint pain Behavioral/Psych: Doesn't report anxiety  Physical Exam: Vitals:   09/18/23 1041 09/18/23 1042  BP: (!) 154/67 (!) 158/62  Pulse: 69   Resp: 17   Temp: 98.1 F (36.7 C)   SpO2: 100%     KPS: 70. General: Alert, cooperative, pleasant, in no acute  distress Head: Normal EENT: No conjunctival injection or scleral icterus.  Lungs: Resp effort normal Cardiac: Regular rate Abdomen: Non-distended abdomen Skin: No rashes cyanosis or petechiae. Extremities: Left arm sling, impaired mobility  Neurologic Exam: Mental Status: Awake, alert, attentive to examiner. Oriented to self and environment. Language is fluent with intact comprehension.  Cranial Nerves: Visual acuity is grossly normal. Left hemianopia noted. Extra-ocular movements intact. No ptosis. Face is symmetric Motor: Tone and bulk are normal. Power is 4/5 in left arm, 4+/5 in left leg. Reflexes are symmetric, no pathologic reflexes present.  Sensory: Intact to light touch Gait: Hemiparetic   Labs: I have reviewed the data as listed    Component Value Date/Time   NA 139 09/18/2023 1022   K 4.0 09/18/2023 1022   CL 106 09/18/2023 1022   CO2 27 09/18/2023 1022   GLUCOSE 117 (H) 09/18/2023 1022   BUN 9 09/18/2023 1022   CREATININE 0.62 09/18/2023 1022   CALCIUM 9.4 09/18/2023 1022   PROT 6.7 09/18/2023 1022   ALBUMIN 4.6 09/18/2023 1022   AST 16 09/18/2023 1022   ALT 10 09/18/2023 1022   ALKPHOS 73 09/18/2023 1022   BILITOT 0.7 09/18/2023 1022   GFRNONAA >60 09/18/2023 1022   Lab Results  Component Value Date   WBC 3.5 (L) 09/18/2023   NEUTROABS 2.4 09/18/2023   HGB 13.9 09/18/2023   HCT 40.2 09/18/2023   MCV 87.4 09/18/2023   PLT 218 09/18/2023      Assessment/Plan Glioblastoma, IDH-wildtype (HCC)  Goals of care, counseling/discussion  Donna Barber is clinically stable today, having completed cycle #11 of 5-day Temodar .  Labs remain within normal limits.  We recommended continuing treatment with cycle #12 Temozolomide  150mg /m2, on for five days and off for twenty three days in twenty eight day cycles. The patient will have a complete blood count performed on days 21 and 28 of each cycle, and a comprehensive metabolic panel performed on day 28 of each  cycle. Labs may need to be performed more often. Zofran  will prescribed  for home use for nausea/vomiting.   Chemotherapy should be held for the following:  ANC less than 1,000  Platelets less than 100,000  LFT or creatinine greater than 2x ULN  If clinical concerns/contraindications develop  Vimpat  will con't 50mg  BID.  Decadron  should remain off if tolerated.  Ok to continue Trazodone  100mg  HS as sleep aid.  We appreciate the opportunity to participate in the care of Donna Barber.    We ask that Donna Barber return to clinic in 1 months with MRI brain, or sooner as needed.  Will discuss stopping chemotherapy if scan remains stable.  All questions were answered. The patient knows to call the clinic with any problems, questions or concerns. No barriers to learning were detected.  The total time spent in the encounter was 40 minutes and more than 50% was on counseling and review of test results   Mamie Searles, MD Medical Director of Neuro-Oncology [facility] at [facility] 09/18/23 11:13 AM

## 2023-09-20 ENCOUNTER — Other Ambulatory Visit: Payer: Self-pay

## 2023-09-20 DIAGNOSIS — S42202D Unspecified fracture of upper end of left humerus, subsequent encounter for fracture with routine healing: Secondary | ICD-10-CM | POA: Diagnosis not present

## 2023-09-20 DIAGNOSIS — M25512 Pain in left shoulder: Secondary | ICD-10-CM | POA: Diagnosis not present

## 2023-09-27 ENCOUNTER — Other Ambulatory Visit: Payer: Self-pay

## 2023-09-27 ENCOUNTER — Encounter: Payer: Self-pay | Admitting: Internal Medicine

## 2023-09-27 ENCOUNTER — Other Ambulatory Visit (HOSPITAL_COMMUNITY): Payer: Self-pay

## 2023-09-27 ENCOUNTER — Telehealth: Payer: Self-pay | Admitting: Pharmacist

## 2023-09-27 DIAGNOSIS — M6281 Muscle weakness (generalized): Secondary | ICD-10-CM | POA: Diagnosis not present

## 2023-09-27 DIAGNOSIS — C719 Malignant neoplasm of brain, unspecified: Secondary | ICD-10-CM

## 2023-09-27 DIAGNOSIS — M25512 Pain in left shoulder: Secondary | ICD-10-CM | POA: Diagnosis not present

## 2023-09-27 DIAGNOSIS — R2681 Unsteadiness on feet: Secondary | ICD-10-CM | POA: Diagnosis not present

## 2023-09-27 DIAGNOSIS — S42202D Unspecified fracture of upper end of left humerus, subsequent encounter for fracture with routine healing: Secondary | ICD-10-CM | POA: Diagnosis not present

## 2023-09-27 MED ORDER — TEMOZOLOMIDE 100 MG PO CAPS
100.0000 mg | ORAL_CAPSULE | Freq: Every day | ORAL | 0 refills | Status: AC
  Filled 2023-09-27 (×3): qty 5, 28d supply, fill #0

## 2023-09-27 MED ORDER — TEMOZOLOMIDE 140 MG PO CAPS
140.0000 mg | ORAL_CAPSULE | Freq: Every day | ORAL | 0 refills | Status: AC
  Filled 2023-09-27 (×3): qty 5, 28d supply, fill #0

## 2023-09-27 NOTE — Progress Notes (Signed)
 Specialty Pharmacy Initial Fill Coordination Note  Donna Barber is a 73 y.o. female contacted today regarding initial fill of specialty medication(s) Temozolomide  (TEMODAR )  Patient requested Delivery   Delivery date: 09/28/23   Verified address: [address] [place], [place] [address]  Medication will be filled on 09/27/23.   Patient is aware of $34.94 copayment.   Hansel Ley, CPhT Supervisor Pharmacy Patient Advocate [facility] [phone] (Ph) 09/27/2023 9:23 AM

## 2023-09-27 NOTE — Telephone Encounter (Signed)
 Oral Chemotherapy Pharmacist Encounter   Notified by Baldomero Bone, RN that patient's husband stated [facility] is out of stock for the temozolomide  100 mg capsules and they are trying to find alternative pharmacy to get June fill of medication.  Confirmed with [facility] that both the TMZ 100 mg capsules and TMZ 140 mg capsules are in stock and that patient's copay for the 100 mg strength is $14.39 and copay for the 140 mg strength is $20.55.  Called patient's husband, Donna Barber, and informed him of the above. He would like to proceed with filling through [facility] going forward as cost is much less.   Patient's husband informed that pharmacy will call to set up shipment to their home. Patient's husband voiced appreciation and understanding.   Jude Norton, PharmD, BCPS, BCOP Hematology/Oncology Clinical Pharmacist [facility] Oral Chemotherapy Navigation Clinics [phone] 09/27/2023 9:11 AM

## 2023-09-27 NOTE — Progress Notes (Signed)
 Oral Chemotherapy Pharmacist Encounter  Patient's husband originally counseled under telephone encounter from 08/16/22.   Jude Norton, PharmD, BCPS, BCOP Hematology/Oncology Clinical Pharmacist [facility] Oral Chemotherapy Navigation Clinics [phone] 09/27/2023 9:26 AM

## 2023-10-17 ENCOUNTER — Other Ambulatory Visit: Payer: Self-pay

## 2023-10-18 ENCOUNTER — Other Ambulatory Visit: Payer: Self-pay

## 2023-10-19 DIAGNOSIS — F39 Unspecified mood [affective] disorder: Secondary | ICD-10-CM | POA: Diagnosis not present

## 2023-10-19 DIAGNOSIS — K219 Gastro-esophageal reflux disease without esophagitis: Secondary | ICD-10-CM | POA: Diagnosis not present

## 2023-10-19 DIAGNOSIS — L309 Dermatitis, unspecified: Secondary | ICD-10-CM | POA: Diagnosis not present

## 2023-10-19 DIAGNOSIS — C719 Malignant neoplasm of brain, unspecified: Secondary | ICD-10-CM | POA: Diagnosis not present

## 2023-10-20 ENCOUNTER — Other Ambulatory Visit: Payer: Self-pay | Admitting: Internal Medicine

## 2023-10-23 ENCOUNTER — Encounter: Payer: Self-pay | Admitting: Internal Medicine

## 2023-10-30 ENCOUNTER — Ambulatory Visit (HOSPITAL_COMMUNITY)
Admission: RE | Admit: 2023-10-30 | Discharge: 2023-10-30 | Disposition: A | Discharge status: Home / Self Care | Source: Ambulatory Visit | Attending: Internal Medicine | Admitting: Internal Medicine

## 2023-10-30 DIAGNOSIS — C719 Malignant neoplasm of brain, unspecified: Secondary | ICD-10-CM | POA: Diagnosis not present

## 2023-10-30 DIAGNOSIS — G9389 Other specified disorders of brain: Secondary | ICD-10-CM | POA: Diagnosis not present

## 2023-10-30 MED ORDER — GADOBUTROL 1 MMOL/ML IV SOLN
5.0000 mL | Freq: Once | INTRAVENOUS | Status: AC | PRN
  Administered 2023-10-30: 5 mL via INTRAVENOUS

## 2023-11-03 ENCOUNTER — Other Ambulatory Visit: Payer: Self-pay

## 2023-11-03 DIAGNOSIS — C719 Malignant neoplasm of brain, unspecified: Secondary | ICD-10-CM

## 2023-11-06 ENCOUNTER — Other Ambulatory Visit: Payer: Self-pay

## 2023-11-06 ENCOUNTER — Inpatient Hospital Stay: Attending: Internal Medicine

## 2023-11-06 ENCOUNTER — Inpatient Hospital Stay (HOSPITAL_BASED_OUTPATIENT_CLINIC_OR_DEPARTMENT_OTHER): Admitting: Internal Medicine

## 2023-11-06 VITALS — BP 152/58 | HR 65 | Temp 97.3°F | Resp 18 | Wt 120.3 lb

## 2023-11-06 DIAGNOSIS — C719 Malignant neoplasm of brain, unspecified: Secondary | ICD-10-CM

## 2023-11-06 DIAGNOSIS — Z79899 Other long term (current) drug therapy: Secondary | ICD-10-CM | POA: Insufficient documentation

## 2023-11-06 DIAGNOSIS — C713 Malignant neoplasm of parietal lobe: Secondary | ICD-10-CM | POA: Diagnosis not present

## 2023-11-06 LAB — CMP (CANCER CENTER ONLY)
ALT: 10 U/L (ref 0–44)
AST: 17 U/L (ref 15–41)
Albumin: 4.5 g/dL (ref 3.5–5.0)
Alkaline Phosphatase: 84 U/L (ref 38–126)
Anion gap: 7 (ref 5–15)
BUN: 10 mg/dL (ref 8–23)
CO2: 28 mmol/L (ref 22–32)
Calcium: 9.4 mg/dL (ref 8.9–10.3)
Chloride: 104 mmol/L (ref 98–111)
Creatinine: 0.57 mg/dL (ref 0.44–1.00)
GFR, Estimated: >60 mL/min (ref >60)
Glucose, Bld: 98 mg/dL (ref 70–99)
Potassium: 3.9 mmol/L (ref 3.5–5.1)
Sodium: 139 mmol/L (ref 135–145)
Total Bilirubin: 0.7 mg/dL (ref 0.0–1.2)
Total Protein: 6.8 g/dL (ref 6.5–8.1)

## 2023-11-06 LAB — CBC WITH DIFFERENTIAL (CANCER CENTER ONLY)
Abs Immature Granulocytes: 0.01 K/uL (ref 0.00–0.07)
Basophils Absolute: 0.1 K/uL (ref 0.0–0.1)
Basophils Relative: 1 %
Eosinophils Absolute: 0.1 K/uL (ref 0.0–0.5)
Eosinophils Relative: 2 %
HCT: 41.8 % (ref 36.0–46.0)
Hemoglobin: 14.2 g/dL (ref 12.0–15.0)
Immature Granulocytes: 0 %
Lymphocytes Relative: 15 %
Lymphs Abs: 0.8 K/uL (ref 0.7–4.0)
MCH: 29.5 pg (ref 26.0–34.0)
MCHC: 34 g/dL (ref 30.0–36.0)
MCV: 86.7 fL (ref 80.0–100.0)
Monocytes Absolute: 0.5 K/uL (ref 0.1–1.0)
Monocytes Relative: 10 %
Neutro Abs: 3.8 K/uL (ref 1.7–7.7)
Neutrophils Relative %: 72 %
Platelet Count: 188 K/uL (ref 150–400)
RBC: 4.82 MIL/uL (ref 3.87–5.11)
RDW: 13.2 % (ref 11.5–15.5)
WBC Count: 5.2 K/uL (ref 4.0–10.5)
nRBC: 0 % (ref 0.0–0.2)

## 2023-11-06 MED ORDER — BACLOFEN 5 MG PO TABS: 5.0000 mg | ORAL_TABLET | Freq: Four times a day (QID) | ORAL | 0 refills | Status: AC | PRN

## 2023-11-06 NOTE — Progress Notes (Signed)
 [facility] [address]  [place], [place] [address] [phone]   Interval Evaluation  Date of Service: 11/06/23 Patient Name: Donna Barber Patient MRN: [id number] Patient DOB: 06-Mar-1951 Provider: Arthea MARLA Manns, MD  Identifying Statement:  Donna Barber is a 73 y.o. female with right parietal glioblastoma    Oncologic History: Oncology History  Glioblastoma, IDH-wildtype (HCC)  07/25/2022 Surgery   Craniotomy, right parietal resection with Dr. Vennie.  Path demonstrates glioblastoma, IDH wild type.   08/29/2022 - 10/11/2022 Radiation Therapy   Completes 6 weeks IMRT and concurrent Temodar  Donna Barber)   11/22/2022 -  Chemotherapy   Initiates 5-day Temodar       Biomarkers:  MGMT Unknown.  IDH 1/2 Wild type.  EGFR Unknown  TERT Unknown   Interval History: Donna Barber presents for follow up, now having completed cycle #12 5-day Temodar .  No issues with treatment this past month.  No new or progressive changes today.  Denies seizures, headaches.  Still has some dizziness and imbalance.    Prior: Unfortunately she had a provoked fall on 11/25 which resulted in a fracture to the humerus near the shoulder joint on the left side.  She is currently in a sling and will visit with ortho next week for next steps.  Pain is still 7 or 8 at this time, dosing oxycodone  PRN.  Otherwise no new deficits.  Maintains functional independence, no worsening of left sided weakness.   H+P (08/16/22) Patient presents to review recent brain tumor diagnosis.  She presented to medical attention in March 2024 with several days history of new onset severe headache.  CNS imaging demonstrated large enhancing mass in right thalamus and occipital lobe.  She underwent craniotomy, resection with Dr. Vennie on 07/25/22 at [facility] path demonstrated glioblastoma IDH wild type.  Following surgery, her left arm and leg were weak, sche completed two weeks of rehab and regained  most of her prior function.  Right now has impaired vision on the left, and some weakness of the left arm.  Otherwise functionally independent.  Medications: Current Outpatient Medications on File Prior to Visit  Medication Sig Dispense Refill   acetaminophen  (TYLENOL ) 325 MG tablet Take 650 mg by mouth every 6 (six) hours as needed.     ALPRAZolam (XANAX) 0.25 MG tablet Take 0.25 mg by mouth at bedtime as needed for anxiety.     famotidine  (PEPCID ) 20 MG tablet TAKE 1 TABLET BY MOUTH AT BEDTIME 30 tablet 2   lacosamide  (VIMPAT ) 50 MG TABS tablet TAKE 1 TABLET BY MOUTH 2 TIMES A DAY 60 tablet 3   loratadine (CLARITIN) 10 MG tablet Take 10 mg by mouth daily as needed for allergies.     naproxen  (NAPROSYN ) 500 MG tablet Take 1 tablet (500 mg total) by mouth every 6 (six) hours as needed for headache. 30 tablet 0   ondansetron  (ZOFRAN ) 8 MG tablet Take 1 tablet (8 mg total) by mouth every 8 (eight) hours as needed for nausea or vomiting. 30 tablet 1   pantoprazole  (PROTONIX ) 40 MG tablet TAKE 1 TABLET BY MOUTH 2 TIMES A DAY 30 tablet 2   prochlorperazine  (COMPAZINE ) 10 MG tablet Take 1 tablet (10 mg total) by mouth every 6 (six) hours as needed. 30 tablet 2   temozolomide  (TEMODAR ) 100 MG capsule Take 1 capsule (100 mg total) by mouth daily. (Take with 140 mg capsules for total daily dose of 240 mg). Take for 5 days on,  23 days off. Repeat every 28 days. May take on an empty stomach to decrease nausea & vomiting. 5 capsule 0   temozolomide  (TEMODAR ) 140 MG capsule Take 1 capsule (140 mg total) by mouth daily. (Take with 100 mg capsules for total daily dose of 240 mg). Take for 5 days on, 23 days off. Repeat every 28 days. May take on an empty stomach to decrease nausea & vomiting. 5 capsule 0   traZODone  (DESYREL ) 100 MG tablet TAKE 1 TABLET BY MOUTH AT BEDTIME 30 tablet 3   No current facility-administered medications on file prior to visit.    Allergies:  Allergies  Allergen Reactions   Sulfa  Antibiotics Nausea And Vomiting   Sulfur Nausea And Vomiting and Nausea Only    Other Reaction(s): GI Intolerance   Codeine Nausea And Vomiting   Elemental Sulfur    Past Medical History:  Past Medical History:  Diagnosis Date   Abnormal Pap smear 2000   Cin-3 treated with LEEP   Canker sores oral    Depression    Dyspareunia    secondary to  atrophy   GERD (gastroesophageal reflux disease)    History of chicken pox    History of measles, mumps, or rubella    Postmenopausal    Uterine fibroid    Vaginal atrophy    Yeast infection    Past Surgical History:  Past Surgical History:  Procedure Laterality Date   CARPAL TUNNEL RELEASE     CRANIECTOMY / CRANIOTOMY FOR EXCISION OF BRAIN TUMOR  07/25/2022   DILATION AND CURETTAGE OF UTERUS     shoulder sugery     TUBAL LIGATION     bilateral   Social History:  Social History   Socioeconomic History   Marital status: Married    Spouse name: Not on file   Number of children: Not on file   Years of education: Not on file   Highest education level: Not on file  Occupational History   Not on file  Tobacco Use   Smoking status: Never   Smokeless tobacco: Never  Vaping Use   Vaping status: Never Used  Substance and Sexual Activity   Alcohol  use: Yes   Drug use: No   Sexual activity: Yes    Birth control/protection: Surgical  Other Topics Concern   Not on file  Social History Narrative   Not on file   Social Drivers of Health   Financial Resource Strain: Low Risk  (01/04/2022)   Received from [facility]   Overall Financial Resource Strain (CARDIA)    Difficulty of Paying Living Expenses: Not hard at all  Food Insecurity: No Food Insecurity (08/16/2022)   Hunger Vital Sign    Worried About Running Out of Food in the Last Year: Never true    Ran Out of Food in the Last Year: Never true  Transportation Needs: No Transportation Needs (08/16/2022)   PRAPARE - Administrator, Civil Service (Medical): No     Lack of Transportation (Non-Medical): No  Physical Activity: Sufficiently Active (01/04/2022)   Received from [facility]   Exercise Vital Sign    On average, how many days per week do you engage in moderate to strenuous exercise (like a brisk walk)?: 6 days    On average, how many minutes do you engage in exercise at this level?: 80 min  Stress: No Stress Concern Present (01/04/2022)   Received from [facility] -  Occupational Stress Questionnaire    Feeling of Stress : Only a little  Social Connections: Unknown (01/05/2023)   [facility]   Social Network    Social Network: Not on file  Intimate Partner Violence: Not At Risk (04/06/2023)   Received from [facility]   HITS    Over the last 12 months how often did your partner physically hurt you?: Never    Over the last 12 months how often did your partner insult you or talk down to you?: Never    Over the last 12 months how often did your partner threaten you with physical harm?: Never    Over the last 12 months how often did your partner scream or curse at you?: Never   Family History:  Family History  Problem Relation Age of Onset   Hypertension Mother    Diabetes Mother    Heart disease Mother    Cancer Brother     Review of Systems: Constitutional: Doesn't report fevers, chills or abnormal weight loss Eyes: Doesn't report blurriness of vision Ears, nose, mouth, throat, and face: Doesn't report sore throat Respiratory: Doesn't report cough, dyspnea or wheezes Cardiovascular: Doesn't report palpitation, chest discomfort  Gastrointestinal:  Doesn't report nausea, constipation, diarrhea GU: Doesn't report incontinence Skin: Doesn't report skin rashes Neurological: Per HPI Musculoskeletal: Doesn't report joint pain Behavioral/Psych: Doesn't report anxiety  Physical Exam: There were no vitals filed for this visit.   KPS: 70. General: Alert, cooperative,  pleasant, in no acute distress Head: Normal EENT: No conjunctival injection or scleral icterus.  Lungs: Resp effort normal Cardiac: Regular rate Abdomen: Non-distended abdomen Skin: No rashes cyanosis or petechiae. Extremities: Left arm sling, impaired mobility  Neurologic Exam: Mental Status: Awake, alert, attentive to examiner. Oriented to self and environment. Language is fluent with intact comprehension.  Cranial Nerves: Visual acuity is grossly normal. Left hemianopia noted. Extra-ocular movements intact. No ptosis. Face is symmetric Motor: Tone and bulk are normal. Power is 4/5 in left arm, 4+/5 in left leg. Reflexes are symmetric, no pathologic reflexes present.  Sensory: Intact to light touch Gait: Hemiparetic   Labs: I have reviewed the data as listed    Component Value Date/Time   NA 139 09/18/2023 1022   K 4.0 09/18/2023 1022   CL 106 09/18/2023 1022   CO2 27 09/18/2023 1022   GLUCOSE 117 (H) 09/18/2023 1022   BUN 9 09/18/2023 1022   CREATININE 0.62 09/18/2023 1022   CALCIUM 9.4 09/18/2023 1022   PROT 6.7 09/18/2023 1022   ALBUMIN 4.6 09/18/2023 1022   AST 16 09/18/2023 1022   ALT 10 09/18/2023 1022   ALKPHOS 73 09/18/2023 1022   BILITOT 0.7 09/18/2023 1022   GFRNONAA >60 09/18/2023 1022   Lab Results  Component Value Date   WBC 5.2 11/06/2023   NEUTROABS 3.8 11/06/2023   HGB 14.2 11/06/2023   HCT 41.8 11/06/2023   MCV 86.7 11/06/2023   PLT 188 11/06/2023    Imaging:  [facility] Clinician Interpretation: I have personally reviewed the CNS images as listed.  My interpretation, in the context of the patient's clinical presentation, is stable disease  MR BRAIN W WO CONTRAST Result Date: 10/30/2023 CLINICAL DATA:  Glioblastoma EXAM: MRI HEAD WITHOUT AND WITH CONTRAST TECHNIQUE: Multiplanar, multiecho pulse sequences of the brain and surrounding structures were obtained without and with intravenous contrast. CONTRAST:  5mL GADAVIST  GADOBUTROL  1 MMOL/ML IV SOLN  COMPARISON:  Aug 18, 2023 FINDINGS: MRI brain: There has been a right  occipital craniotomy for tumor resection. There is an underlying area of encephalomalacia and T2 hyperintensity involving the occipital, posterior temporal and posterior parietal lobe. There are multifocal areas of magnetic susceptibility in the posteromedial temporal lobe and occipital lobe. These abnormalities are unchanged. There is an irregular area of enhancement in the posteromedial temporal lobe extending into the occipital lobe that measures approximately 8.4 x 2.2 by 3.7 cm and appears similar to the prior study. There is no acute or chronic infarct. No hydrocephalus There are normal flow signals in the carotid arteries and basilar artery. No significant bone marrow signal abnormality. No significant abnormality in the paranasal sinuses or soft tissues. IMPRESSION: No change compared with Aug 18, 2023 Electronically Signed   By: Nancyann Burns M.D.   On: 10/30/2023 10:23    Assessment/Plan Glioblastoma, IDH-wildtype (HCC)  Donna Barber is clinically stable today, having completed cycle #12 of 5-day Temodar .  MRI brain demonstrates stable findings.  We recommended holding off on further chemotherapy, transitioning to imaging surveillance only at this time.  She is agreeable with this.  Vimpat  will con't 50mg  BID.  Ok to continue Trazodone  100mg  HS as sleep aid.  Discussed trial of baclofen  5mg  TID PRN for spasticity, spasms.  We appreciate the opportunity to participate in the care of Donna Barber.    We ask that Donna Barber return to clinic in 2 months with MRI brain, or sooner as needed.    All questions were answered. The patient knows to call the clinic with any problems, questions or concerns. No barriers to learning were detected.  The total time spent in the encounter was 40 minutes and more than 50% was on counseling and review of test results   Arthea MARLA Manns, MD Medical Director of  Neuro-Oncology [facility] 11/06/23 2:07 PM

## 2023-11-08 ENCOUNTER — Other Ambulatory Visit: Payer: Self-pay

## 2023-11-09 ENCOUNTER — Other Ambulatory Visit: Payer: Self-pay

## 2023-11-16 DIAGNOSIS — E7800 Pure hypercholesterolemia, unspecified: Secondary | ICD-10-CM | POA: Diagnosis not present

## 2023-11-20 ENCOUNTER — Telehealth: Payer: Self-pay | Admitting: *Deleted

## 2023-11-20 NOTE — Telephone Encounter (Signed)
 Returned PC to patient, she requested that we schedule her upcoming MRI - scheduled on 01/04/24 at [facility] at 1:30, she is to arrive at 1:00.  Patient verbalizes understanding.

## 2023-11-24 ENCOUNTER — Other Ambulatory Visit: Payer: Self-pay

## 2023-12-10 ENCOUNTER — Other Ambulatory Visit: Payer: Self-pay | Admitting: Internal Medicine

## 2023-12-11 ENCOUNTER — Encounter: Payer: Self-pay | Admitting: Internal Medicine

## 2023-12-14 ENCOUNTER — Other Ambulatory Visit: Payer: Self-pay

## 2023-12-17 DIAGNOSIS — E7800 Pure hypercholesterolemia, unspecified: Secondary | ICD-10-CM | POA: Diagnosis not present

## 2024-01-04 ENCOUNTER — Ambulatory Visit (HOSPITAL_COMMUNITY)
Admission: RE | Admit: 2024-01-04 | Discharge: 2024-01-04 | Disposition: A | Discharge status: Home / Self Care | Source: Ambulatory Visit | Attending: Internal Medicine | Admitting: Internal Medicine

## 2024-01-04 DIAGNOSIS — C719 Malignant neoplasm of brain, unspecified: Secondary | ICD-10-CM | POA: Diagnosis not present

## 2024-01-04 MED ORDER — GADOBUTROL 1 MMOL/ML IV SOLN
5.0000 mL | Freq: Once | INTRAVENOUS | Status: AC | PRN
  Administered 2024-01-04: 5 mL via INTRAVENOUS

## 2024-01-05 ENCOUNTER — Other Ambulatory Visit: Payer: Self-pay | Admitting: Internal Medicine

## 2024-01-05 DIAGNOSIS — C719 Malignant neoplasm of brain, unspecified: Secondary | ICD-10-CM

## 2024-01-08 ENCOUNTER — Other Ambulatory Visit: Payer: Self-pay | Admitting: *Deleted

## 2024-01-08 ENCOUNTER — Telehealth: Payer: Self-pay | Admitting: Internal Medicine

## 2024-01-08 ENCOUNTER — Inpatient Hospital Stay (HOSPITAL_BASED_OUTPATIENT_CLINIC_OR_DEPARTMENT_OTHER): Admitting: Internal Medicine

## 2024-01-08 ENCOUNTER — Inpatient Hospital Stay: Attending: Internal Medicine

## 2024-01-08 ENCOUNTER — Other Ambulatory Visit: Payer: Self-pay

## 2024-01-08 VITALS — BP 163/97 | HR 68 | Temp 97.3°F | Resp 18 | Wt 121.0 lb

## 2024-01-08 DIAGNOSIS — Z79630 Long term (current) use of alkylating agent: Secondary | ICD-10-CM | POA: Diagnosis not present

## 2024-01-08 DIAGNOSIS — C719 Malignant neoplasm of brain, unspecified: Secondary | ICD-10-CM

## 2024-01-08 DIAGNOSIS — Z79899 Other long term (current) drug therapy: Secondary | ICD-10-CM | POA: Diagnosis not present

## 2024-01-08 DIAGNOSIS — C713 Malignant neoplasm of parietal lobe: Secondary | ICD-10-CM | POA: Insufficient documentation

## 2024-01-08 LAB — CMP (CANCER CENTER ONLY)
ALT: 11 U/L (ref 0–44)
AST: 17 U/L (ref 15–41)
Albumin: 4.9 g/dL (ref 3.5–5.0)
Alkaline Phosphatase: 85 U/L (ref 38–126)
Anion gap: 4 — ABNORMAL LOW (ref 5–15)
BUN: 10 mg/dL (ref 8–23)
CO2: 32 mmol/L (ref 22–32)
Calcium: 9.7 mg/dL (ref 8.9–10.3)
Chloride: 103 mmol/L (ref 98–111)
Creatinine: 0.6 mg/dL (ref 0.44–1.00)
GFR, Estimated: >60 mL/min (ref >60)
Glucose, Bld: 101 mg/dL — ABNORMAL HIGH (ref 70–99)
Potassium: 4.8 mmol/L (ref 3.5–5.1)
Sodium: 139 mmol/L (ref 135–145)
Total Bilirubin: 0.6 mg/dL (ref 0.0–1.2)
Total Protein: 7.1 g/dL (ref 6.5–8.1)

## 2024-01-08 LAB — CBC WITH DIFFERENTIAL (CANCER CENTER ONLY)
Abs Immature Granulocytes: 0.01 K/uL (ref 0.00–0.07)
Basophils Absolute: 0.1 K/uL (ref 0.0–0.1)
Basophils Relative: 1 %
Eosinophils Absolute: 0.1 K/uL (ref 0.0–0.5)
Eosinophils Relative: 1 %
HCT: 43.1 % (ref 36.0–46.0)
Hemoglobin: 14.5 g/dL (ref 12.0–15.0)
Immature Granulocytes: 0 %
Lymphocytes Relative: 16 %
Lymphs Abs: 0.8 K/uL (ref 0.7–4.0)
MCH: 29.5 pg (ref 26.0–34.0)
MCHC: 33.6 g/dL (ref 30.0–36.0)
MCV: 87.8 fL (ref 80.0–100.0)
Monocytes Absolute: 0.6 K/uL (ref 0.1–1.0)
Monocytes Relative: 11 %
Neutro Abs: 3.6 K/uL (ref 1.7–7.7)
Neutrophils Relative %: 71 %
Platelet Count: 204 K/uL (ref 150–400)
RBC: 4.91 MIL/uL (ref 3.87–5.11)
RDW: 13.5 % (ref 11.5–15.5)
WBC Count: 5.2 K/uL (ref 4.0–10.5)
nRBC: 0 % (ref 0.0–0.2)

## 2024-01-08 NOTE — Telephone Encounter (Signed)
 Scheduled appointment per 9/22 los. Called to the patients spouse and he is aware of the made appointment for the patient.

## 2024-01-08 NOTE — Progress Notes (Signed)
 [facility] [address]  [place], [place] [address] [phone]   Interval Evaluation  Date of Service: 01/08/24 Patient Name: Donna Barber Patient MRN: [id number] Patient DOB: 10-31-1950 Provider: Arthea MARLA Manns, MD  Identifying Statement:  Donna Barber is a 73 y.o. female with right parietal glioblastoma    Oncologic History: Oncology History  Glioblastoma, IDH-wildtype (HCC)  07/25/2022 Surgery   Craniotomy, right parietal resection with Dr. Vennie.  Path demonstrates glioblastoma, IDH wild type.   08/29/2022 - 10/11/2022 Radiation Therapy   Completes 6 weeks IMRT and concurrent Temodar  [place])   11/22/2022 - 11/06/2023 Chemotherapy   Completes 12 cycle 5-day Temodar       Biomarkers:  MGMT Unknown.  IDH 1/2 Wild type.  EGFR Unknown  TERT Unknown   Interval History: Donna Barber presents for follow up following recent MRI brain.  She is doing well now off treatment.  No new or progressive changes today.  Denies seizures, headaches.  Still has some dizziness and imbalance.  Walking outside 1-2 times per day with husband.    Prior: Unfortunately she had a provoked fall on 11/25 which resulted in a fracture to the humerus near the shoulder joint on the left side.  She is currently in a sling and will visit with ortho next week for next steps.  Pain is still 7 or 8 at this time, dosing oxycodone  PRN.  Otherwise no new deficits.  Maintains functional independence, no worsening of left sided weakness.   H+P (08/16/22) Patient presents to review recent brain tumor diagnosis.  She presented to medical attention in March 2024 with several days history of new onset severe headache.  CNS imaging demonstrated large enhancing mass in right thalamus and occipital lobe.  She underwent craniotomy, resection with Dr. Vennie on 07/25/22 at [facility]; path demonstrated glioblastoma IDH wild type.  Following surgery, her left arm and leg were weak, sche  completed two weeks of rehab and regained most of her prior function.  Right now has impaired vision on the left, and some weakness of the left arm.  Otherwise functionally independent.  Medications: Current Outpatient Medications on File Prior to Visit  Medication Sig Dispense Refill   acetaminophen  (TYLENOL ) 325 MG tablet Take 650 mg by mouth every 6 (six) hours as needed.     ALPRAZolam (XANAX) 0.25 MG tablet Take 0.25 mg by mouth at bedtime as needed for anxiety.     Baclofen  5 MG TABS Take 1 tablet (5 mg total) by mouth every 6 (six) hours as needed (cramping). 90 tablet 0   famotidine  (PEPCID ) 20 MG tablet TAKE 1 TABLET BY MOUTH AT BEDTIME 30 tablet 2   lacosamide  (VIMPAT ) 50 MG TABS tablet TAKE 1 TABLET BY MOUTH 2 TIMES A DAY 60 tablet 3   loratadine (CLARITIN) 10 MG tablet Take 10 mg by mouth daily as needed for allergies.     naproxen  (NAPROSYN ) 500 MG tablet Take 1 tablet (500 mg total) by mouth every 6 (six) hours as needed for headache. 30 tablet 0   ondansetron  (ZOFRAN ) 8 MG tablet Take 1 tablet (8 mg total) by mouth every 8 (eight) hours as needed for nausea or vomiting. 30 tablet 1   pantoprazole  (PROTONIX ) 40 MG tablet TAKE 1 TABLET BY MOUTH 2 TIMES A DAY 30 tablet 2   prochlorperazine  (COMPAZINE ) 10 MG tablet Take 1 tablet (10 mg total) by mouth every 6 (six) hours as needed. 30 tablet 2  temozolomide  (TEMODAR ) 100 MG capsule Take 1 capsule (100 mg total) by mouth daily. (Take with 140 mg capsules for total daily dose of 240 mg). Take for 5 days on, 23 days off. Repeat every 28 days. May take on an empty stomach to decrease nausea & vomiting. 5 capsule 0   temozolomide  (TEMODAR ) 140 MG capsule Take 1 capsule (140 mg total) by mouth daily. (Take with 100 mg capsules for total daily dose of 240 mg). Take for 5 days on, 23 days off. Repeat every 28 days. May take on an empty stomach to decrease nausea & vomiting. 5 capsule 0   traZODone  (DESYREL ) 100 MG tablet TAKE 1 TABLET BY MOUTH  AT BEDTIME 30 tablet 3   No current facility-administered medications on file prior to visit.    Allergies:  Allergies  Allergen Reactions   Sulfa Antibiotics Nausea And Vomiting   Sulfur Nausea And Vomiting and Nausea Only    Other Reaction(s): GI Intolerance   Codeine Nausea And Vomiting   Elemental Sulfur    Past Medical History:  Past Medical History:  Diagnosis Date   Abnormal Pap smear 2000   Cin-3 treated with LEEP   Canker sores oral    Depression    Dyspareunia    secondary to  atrophy   GERD (gastroesophageal reflux disease)    History of chicken pox    History of measles, mumps, or rubella    Postmenopausal    Uterine fibroid    Vaginal atrophy    Yeast infection    Past Surgical History:  Past Surgical History:  Procedure Laterality Date   CARPAL TUNNEL RELEASE     CRANIECTOMY / CRANIOTOMY FOR EXCISION OF BRAIN TUMOR  07/25/2022   DILATION AND CURETTAGE OF UTERUS     shoulder sugery     TUBAL LIGATION     bilateral   Social History:  Social History   Socioeconomic History   Marital status: Married    Spouse name: Not on file   Number of children: Not on file   Years of education: Not on file   Highest education level: Not on file  Occupational History   Not on file  Tobacco Use   Smoking status: Never   Smokeless tobacco: Never  Vaping Use   Vaping status: Never Used  Substance and Sexual Activity   Alcohol  use: Yes   Drug use: No   Sexual activity: Yes    Birth control/protection: Surgical  Other Topics Concern   Not on file  Social History Narrative   Not on file   Social Drivers of Health   Financial Resource Strain: Low Risk  (01/04/2022)   Received from [facility]   Overall Financial Resource Strain (CARDIA)    Difficulty of Paying Living Expenses: Not hard at all  Food Insecurity: No Food Insecurity (08/16/2022)   Hunger Vital Sign    Worried About Running Out of Food in the Last Year: Never true    Ran Out of Food in the  Last Year: Never true  Transportation Needs: No Transportation Needs (08/16/2022)   PRAPARE - Administrator, Civil Service (Medical): No    Lack of Transportation (Non-Medical): No  Physical Activity: Sufficiently Active (01/04/2022)   Received from [facility]   Exercise Vital Sign    On average, how many days per week do you engage in moderate to strenuous exercise (like a brisk walk)?: 6 days    On average, how many minutes  do you engage in exercise at this level?: 80 min  Stress: No Stress Concern Present (01/04/2022)   Received from [facility] - Occupational Stress Questionnaire    Feeling of Stress : Only a little  Social Connections: Unknown (01/05/2023)   Received from [facility]   Social Network    Social Network: Not on file  Intimate Partner Violence: Not At Risk (04/06/2023)   Received from [facility]   HITS    Over the last 12 months how often did your partner physically hurt you?: Never    Over the last 12 months how often did your partner insult you or talk down to you?: Never    Over the last 12 months how often did your partner threaten you with physical harm?: Never    Over the last 12 months how often did your partner scream or curse at you?: Never   Family History:  Family History  Problem Relation Age of Onset   Hypertension Mother    Diabetes Mother    Heart disease Mother    Cancer Brother     Review of Systems: Constitutional: Doesn't report fevers, chills or abnormal weight loss Eyes: Doesn't report blurriness of vision Ears, nose, mouth, throat, and face: Doesn't report sore throat Respiratory: Doesn't report cough, dyspnea or wheezes Cardiovascular: Doesn't report palpitation, chest discomfort  Gastrointestinal:  Doesn't report nausea, constipation, diarrhea GU: Doesn't report incontinence Skin: Doesn't report skin rashes Neurological: Per HPI Musculoskeletal: Doesn't report joint  pain Behavioral/Psych: Doesn't report anxiety  Physical Exam: Vitals:   01/08/24 1344 01/08/24 1356  BP: (!) 177/79 (!) 163/97  Pulse: 68   Resp: 18   Temp: (!) 97.3 F (36.3 C)   SpO2: 99%    KPS: 70. General: Alert, cooperative, pleasant, in no acute distress Head: Normal EENT: No conjunctival injection or scleral icterus.  Lungs: Resp effort normal Cardiac: Regular rate Abdomen: Non-distended abdomen Skin: No rashes cyanosis or petechiae. Extremities: Left arm sling, impaired mobility  Neurologic Exam: Mental Status: Awake, alert, attentive to examiner. Oriented to self and environment. Language is fluent with intact comprehension.  Cranial Nerves: Visual acuity is grossly normal. Left hemianopia noted. Extra-ocular movements intact. No ptosis. Face is symmetric Motor: Tone and bulk are normal. Power is 4/5 in left arm, 4+/5 in left leg. Reflexes are symmetric, no pathologic reflexes present.  Sensory: Intact to light touch Gait: Hemiparetic   Labs: I have reviewed the data as listed    Component Value Date/Time   NA 139 11/06/2023 1345   K 3.9 11/06/2023 1345   CL 104 11/06/2023 1345   CO2 28 11/06/2023 1345   GLUCOSE 98 11/06/2023 1345   BUN 10 11/06/2023 1345   CREATININE 0.57 11/06/2023 1345   CALCIUM 9.4 11/06/2023 1345   PROT 6.8 11/06/2023 1345   ALBUMIN 4.5 11/06/2023 1345   AST 17 11/06/2023 1345   ALT 10 11/06/2023 1345   ALKPHOS 84 11/06/2023 1345   BILITOT 0.7 11/06/2023 1345   GFRNONAA >60 11/06/2023 1345   Lab Results  Component Value Date   WBC 5.2 11/06/2023   NEUTROABS 3.8 11/06/2023   HGB 14.2 11/06/2023   HCT 41.8 11/06/2023   MCV 86.7 11/06/2023   PLT 188 11/06/2023    Imaging:  [facility] Clinician Interpretation: I have personally reviewed the CNS images as listed.  My interpretation, in the context of the patient's clinical presentation, is stable disease  MR BRAIN W WO CONTRAST  Result Date: 01/04/2024 EXAM: MRI BRAIN WITH AND  WITHOUT CONTRAST 01/04/2024 02:20:00 PM TECHNIQUE: Multiplanar multisequence MRI of the head/brain was performed with and without the administration of intravenous contrast. COMPARISON: MRI head 10/30/2023 and earlier. CLINICAL HISTORY: Brain/CNS neoplasm, assess treatment response. Brain/CNS neoplasm, assess treatment response, Glioblastoma, IDH-wildtype. FINDINGS: BRAIN AND VENTRICLES: No acute infarct. No midline shift. Similar narrowing of the atrium and posterior temporal horn of the right lateral ventricle. No hydrocephalus. There are no new intracranial lesions. Similar matter enhancement along the surface of the left temporal horn. Post-surgical changes of right occipital craniotomy for resection of underlying tumor redemonstrated. Irregular area of enhancement within the right occipital lobe extending into the posteromedial aspect of the right temporal lobe and posterior inferior right parietal lobe. This region measures approximately 7.6 x 2.9 x 3.9 cm. When measuring in a similar manner, this focus previously measured 7.8 x 2.8 x 3.9 cm. Signal abnormality along the margin of the enhancing lesion. Similar appearance of surrounding T2/FLAIR hyperintensity in the right occipital lobe extending into the right parietal lobe and posterior right temporal lobe. Signal abnormality also extends into the external capsule and partially extends into the posterior limb of the internal capsule on the right, similar to prior. Similar cystic focus along the posterior inferior right occipital lobe. Scattered areas of susceptibility are similar to prior. ORBITS: No acute abnormality. SINUSES: No acute abnormality. BONES AND SOFT TISSUES: Normal bone marrow signal and enhancement. No acute soft tissue abnormality. IMPRESSION: 1. Stable post-surgical changes of right occipital craniotomy for resection of underlying tumor. 2. Similar irregular enhancing lesion and surrounding T2/FLAIR hyperintensity. 3. No new intracranial  lesions. Electronically signed by: Donnice Mania MD 01/04/2024 10:38 PM EDT RP Workstation: [id number]    Assessment/Plan Glioblastoma, IDH-wildtype (HCC)  Donna Barber is clinically stable today, now on observation having completed cycle #12 of 5-day Temodar .  MRI brain continues to demonstrate stable findings.  We recommended continuing imaging surveillance only at this time.  She is agreeable with this.  Vimpat  will con't 50mg  BID.  Ok to continue Trazodone  100mg  HS as sleep aid.  We appreciate the opportunity to participate in the care of Donna Barber.    We ask that Donna Barber return to clinic in 2 months with MRI brain, or sooner as needed.    All questions were answered. The patient knows to call the clinic with any problems, questions or concerns. No barriers to learning were detected.  The total time spent in the encounter was 40 minutes and more than 50% was on counseling and review of test results   Arthea MARLA Manns, MD Medical Director of Neuro-Oncology [facility] 01/08/24 1:39 PM

## 2024-01-09 ENCOUNTER — Other Ambulatory Visit: Payer: Self-pay

## 2024-01-09 NOTE — Progress Notes (Signed)
 Disenrolled - per 9.22 office visit notes patient will be monitored by onco and temodar  therapy complete.

## 2024-01-12 ENCOUNTER — Other Ambulatory Visit: Payer: Self-pay | Admitting: Internal Medicine

## 2024-01-20 ENCOUNTER — Other Ambulatory Visit: Payer: Self-pay | Admitting: Internal Medicine

## 2024-01-22 ENCOUNTER — Encounter: Payer: Self-pay | Admitting: Internal Medicine

## 2024-02-03 DIAGNOSIS — Z23 Encounter for immunization: Secondary | ICD-10-CM | POA: Diagnosis not present

## 2024-02-12 ENCOUNTER — Other Ambulatory Visit: Payer: Self-pay | Admitting: Internal Medicine

## 2024-02-19 ENCOUNTER — Telehealth: Payer: Self-pay | Admitting: *Deleted

## 2024-02-19 NOTE — Telephone Encounter (Signed)
-----   Message from Arthea MARLA Manns sent at 02/19/2024  2:02 PM EST ----- yes ----- Message ----- From: Marget Rudell ORN, RN Sent: 02/19/2024  12:33 PM EST To: Zachary K Vaslow, MD  Her daughter called, her PCP wants her to have a colonoscopy, however, they said she was advised not have one done during her tx.  Is it ok for her to have one at this time?

## 2024-02-19 NOTE — Telephone Encounter (Signed)
 PC to patient, informed her she is ok to have colonoscopy per Dr Buckley.  She verbalizes understanding.

## 2024-03-04 DIAGNOSIS — M859 Disorder of bone density and structure, unspecified: Secondary | ICD-10-CM | POA: Diagnosis not present

## 2024-03-04 DIAGNOSIS — E7800 Pure hypercholesterolemia, unspecified: Secondary | ICD-10-CM | POA: Diagnosis not present

## 2024-03-04 DIAGNOSIS — R7309 Other abnormal glucose: Secondary | ICD-10-CM | POA: Diagnosis not present

## 2024-03-04 DIAGNOSIS — K219 Gastro-esophageal reflux disease without esophagitis: Secondary | ICD-10-CM | POA: Diagnosis not present

## 2024-03-04 DIAGNOSIS — R5383 Other fatigue: Secondary | ICD-10-CM | POA: Diagnosis not present

## 2024-03-06 DIAGNOSIS — M859 Disorder of bone density and structure, unspecified: Secondary | ICD-10-CM | POA: Diagnosis not present

## 2024-03-06 DIAGNOSIS — K219 Gastro-esophageal reflux disease without esophagitis: Secondary | ICD-10-CM | POA: Diagnosis not present

## 2024-03-06 DIAGNOSIS — F39 Unspecified mood [affective] disorder: Secondary | ICD-10-CM | POA: Diagnosis not present

## 2024-03-06 DIAGNOSIS — Z0000 Encounter for general adult medical examination without abnormal findings: Secondary | ICD-10-CM | POA: Diagnosis not present

## 2024-03-06 DIAGNOSIS — E7800 Pure hypercholesterolemia, unspecified: Secondary | ICD-10-CM | POA: Diagnosis not present

## 2024-03-07 ENCOUNTER — Ambulatory Visit (HOSPITAL_COMMUNITY): Admission: RE | Admit: 2024-03-07 | Source: Ambulatory Visit

## 2024-03-09 ENCOUNTER — Ambulatory Visit (HOSPITAL_COMMUNITY)
Admission: RE | Admit: 2024-03-09 | Discharge: 2024-03-09 | Disposition: A | Discharge status: Home / Self Care | Source: Ambulatory Visit | Attending: Internal Medicine | Admitting: Internal Medicine

## 2024-03-09 DIAGNOSIS — C719 Malignant neoplasm of brain, unspecified: Secondary | ICD-10-CM | POA: Insufficient documentation

## 2024-03-09 DIAGNOSIS — G936 Cerebral edema: Secondary | ICD-10-CM | POA: Diagnosis not present

## 2024-03-09 MED ORDER — GADOBUTROL 1 MMOL/ML IV SOLN
5.0000 mL | Freq: Once | INTRAVENOUS | Status: AC | PRN
  Administered 2024-03-09: 5 mL via INTRAVENOUS

## 2024-03-11 ENCOUNTER — Inpatient Hospital Stay: Attending: Internal Medicine | Admitting: Internal Medicine

## 2024-03-11 VITALS — BP 145/56 | HR 72 | Temp 97.7°F | Resp 17 | Wt 122.3 lb

## 2024-03-11 DIAGNOSIS — C719 Malignant neoplasm of brain, unspecified: Secondary | ICD-10-CM

## 2024-03-11 DIAGNOSIS — Z79899 Other long term (current) drug therapy: Secondary | ICD-10-CM | POA: Diagnosis not present

## 2024-03-11 DIAGNOSIS — C713 Malignant neoplasm of parietal lobe: Secondary | ICD-10-CM | POA: Diagnosis not present

## 2024-03-11 NOTE — Progress Notes (Signed)
 [facility] at [facility] [address]  [place], [place] [address] [phone]   Interval Evaluation  Date of Service: 03/11/24 Patient Name: Donna Barber Patient MRN: [id number] Patient DOB: 1950/04/22 Provider: Arthea MARLA Manns, MD  Identifying Statement:  Donna Barber is a 73 y.o. female with right parietal glioblastoma    Oncologic History: Oncology History  Glioblastoma, IDH-wildtype (HCC)  07/25/2022 Surgery   Craniotomy, right parietal resection with Dr. Vennie.  Path demonstrates glioblastoma, IDH wild type.   08/29/2022 - 10/11/2022 Radiation Therapy   Completes 6 weeks IMRT and concurrent Temodar  Audry)   11/22/2022 - 11/06/2023 Chemotherapy   Completes 12 cycle 5-day Temodar       Biomarkers:  MGMT Unknown.  IDH 1/2 Wild type.  EGFR Unknown  TERT Unknown   Interval History: Donna Barber presents for follow up following recent MRI brain.  Denies any neurologic changes or novel complaints today.  Denies seizures, headaches.  Still has some dizziness and imbalance.  Walking outside 1-2 times per day with husband.    Prior: Unfortunately she had a provoked fall on 11/25 which resulted in a fracture to the humerus near the shoulder joint on the left side.  She is currently in a sling and will visit with ortho next week for next steps.  Pain is still 7 or 8 at this time, dosing oxycodone  PRN.  Otherwise no new deficits.  Maintains functional independence, no worsening of left sided weakness.   H+P (08/16/22) Patient presents to review recent brain tumor diagnosis.  She presented to medical attention in March 2024 with several days history of new onset severe headache.  CNS imaging demonstrated large enhancing mass in right thalamus and occipital lobe.  She underwent craniotomy, resection with Dr. Vennie on 07/25/22 at [facility]; path demonstrated glioblastoma IDH wild type.  Following surgery, her left arm and leg were weak, sche completed two weeks  of rehab and regained most of her prior function.  Right now has impaired vision on the left, and some weakness of the left arm.  Otherwise functionally independent.  Medications: Current Outpatient Medications on File Prior to Visit  Medication Sig Dispense Refill   acetaminophen  (TYLENOL ) 325 MG tablet Take 650 mg by mouth every 6 (six) hours as needed.     ALPRAZolam (XANAX) 0.25 MG tablet Take 0.25 mg by mouth at bedtime as needed for anxiety.     Baclofen  5 MG TABS Take 1 tablet (5 mg total) by mouth every 6 (six) hours as needed (cramping). 90 tablet 0   famotidine  (PEPCID ) 20 MG tablet TAKE 1 TABLET BY MOUTH AT BEDTIME 30 tablet 2   lacosamide  (VIMPAT ) 50 MG TABS tablet TAKE 1 TABLET BY MOUTH 2 TIMES A DAY 60 tablet 2   loratadine (CLARITIN) 10 MG tablet Take 10 mg by mouth daily as needed for allergies.     naproxen  (NAPROSYN ) 500 MG tablet Take 1 tablet (500 mg total) by mouth every 6 (six) hours as needed for headache. 30 tablet 0   ondansetron  (ZOFRAN ) 8 MG tablet Take 1 tablet (8 mg total) by mouth every 8 (eight) hours as needed for nausea or vomiting. 30 tablet 1   pantoprazole  (PROTONIX ) 40 MG tablet TAKE 1 TABLET BY MOUTH 2 TIMES A DAY 30 tablet 2   prochlorperazine  (COMPAZINE ) 10 MG tablet TAKE 1 TABLET BY MOUTH EVERY 6 HOURS AS NEEDED 30 tablet 2   temozolomide  (TEMODAR ) 100 MG capsule Take 1 capsule (100  mg total) by mouth daily. (Take with 140 mg capsules for total daily dose of 240 mg). Take for 5 days on, 23 days off. Repeat every 28 days. May take on an empty stomach to decrease nausea & vomiting. (Patient not taking: Reported on 01/08/2024) 5 capsule 0   temozolomide  (TEMODAR ) 140 MG capsule Take 1 capsule (140 mg total) by mouth daily. (Take with 100 mg capsules for total daily dose of 240 mg). Take for 5 days on, 23 days off. Repeat every 28 days. May take on an empty stomach to decrease nausea & vomiting. (Patient not taking: Reported on 01/08/2024) 5 capsule 0   traZODone   (DESYREL ) 100 MG tablet TAKE 1 TABLET BY MOUTH AT BEDTIME 30 tablet 3   No current facility-administered medications on file prior to visit.    Allergies:  Allergies  Allergen Reactions   Sulfa Antibiotics Nausea And Vomiting   Sulfur Nausea And Vomiting and Nausea Only    Other Reaction(s): GI Intolerance   Codeine Nausea And Vomiting   Elemental Sulfur    Past Medical History:  Past Medical History:  Diagnosis Date   Abnormal Pap smear 2000   Cin-3 treated with LEEP   Canker sores oral    Depression    Dyspareunia    secondary to  atrophy   GERD (gastroesophageal reflux disease)    History of chicken pox    History of measles, mumps, or rubella    Postmenopausal    Uterine fibroid    Vaginal atrophy    Yeast infection    Past Surgical History:  Past Surgical History:  Procedure Laterality Date   CARPAL TUNNEL RELEASE     CRANIECTOMY / CRANIOTOMY FOR EXCISION OF BRAIN TUMOR  07/25/2022   DILATION AND CURETTAGE OF UTERUS     shoulder sugery     TUBAL LIGATION     bilateral   Social History:  Social History   Socioeconomic History   Marital status: Married    Spouse name: Not on file   Number of children: Not on file   Years of education: Not on file   Highest education level: Not on file  Occupational History   Not on file  Tobacco Use   Smoking status: Never   Smokeless tobacco: Never  Vaping Use   Vaping status: Never Used  Substance and Sexual Activity   Alcohol  use: Yes   Drug use: No   Sexual activity: Yes    Birth control/protection: Surgical  Other Topics Concern   Not on file  Social History Narrative   Not on file   Social Drivers of Health   Financial Resource Strain: Low Risk  (01/04/2022)   Received from [facility]   Overall Financial Resource Strain (CARDIA)    Difficulty of Paying Living Expenses: Not hard at all  Food Insecurity: No Food Insecurity (08/16/2022)   Hunger Vital Sign    Worried About Running Out of Food in the  Last Year: Never true    Ran Out of Food in the Last Year: Never true  Transportation Needs: No Transportation Needs (08/16/2022)   PRAPARE - Administrator, Civil Service (Medical): No    Lack of Transportation (Non-Medical): No  Physical Activity: Sufficiently Active (01/04/2022)   Received from [facility]   Exercise Vital Sign    On average, how many days per week do you engage in moderate to strenuous exercise (like a brisk walk)?: 6 days    On average,  how many minutes do you engage in exercise at this level?: 80 min  Stress: No Stress Concern Present (01/04/2022)   Received from [facility] - Occupational Stress Questionnaire    Feeling of Stress : Only a little  Social Connections: Moderately Integrated (01/04/2022)   Received from [facility]   Social Network    How would you rate your social network (family, work, friends)?: Adequate participation with social networks  Intimate Partner Violence: Not At Risk (04/06/2023)   Received from [facility]   HITS    Over the last 12 months how often did your partner physically hurt you?: Never    Over the last 12 months how often did your partner insult you or talk down to you?: Never    Over the last 12 months how often did your partner threaten you with physical harm?: Never    Over the last 12 months how often did your partner scream or curse at you?: Never   Family History:  Family History  Problem Relation Age of Onset   Hypertension Mother    Diabetes Mother    Heart disease Mother    Cancer Brother     Review of Systems: Constitutional: Doesn't report fevers, chills or abnormal weight loss Eyes: Doesn't report blurriness of vision Ears, nose, mouth, throat, and face: Doesn't report sore throat Respiratory: Doesn't report cough, dyspnea or wheezes Cardiovascular: Doesn't report palpitation, chest discomfort  Gastrointestinal:  Doesn't report nausea,  constipation, diarrhea GU: Doesn't report incontinence Skin: Doesn't report skin rashes Neurological: Per HPI Musculoskeletal: Doesn't report joint pain Behavioral/Psych: Doesn't report anxiety  Physical Exam: Vitals:   03/11/24 1028  BP: (!) 145/56  Pulse: 72  Resp: 17  Temp: 97.7 F (36.5 C)  SpO2: 100%   KPS: 70. General: Alert, cooperative, pleasant, in no acute distress Head: Normal EENT: No conjunctival injection or scleral icterus.  Lungs: Resp effort normal Cardiac: Regular rate Abdomen: Non-distended abdomen Skin: No rashes cyanosis or petechiae. Extremities: Left arm sling, impaired mobility  Neurologic Exam: Mental Status: Awake, alert, attentive to examiner. Oriented to self and environment. Language is fluent with intact comprehension.  Cranial Nerves: Visual acuity is grossly normal. Left hemianopia noted. Extra-ocular movements intact. No ptosis. Face is symmetric Motor: Tone and bulk are normal. Power is 4/5 in left arm, 4+/5 in left leg. Reflexes are symmetric, no pathologic reflexes present.  Sensory: Intact to light touch Gait: Hemiparetic   Labs: I have reviewed the data as listed    Component Value Date/Time   NA 139 01/08/2024 1321   K 4.8 01/08/2024 1321   CL 103 01/08/2024 1321   CO2 32 01/08/2024 1321   GLUCOSE 101 (H) 01/08/2024 1321   BUN 10 01/08/2024 1321   CREATININE 0.60 01/08/2024 1321   CALCIUM 9.7 01/08/2024 1321   PROT 7.1 01/08/2024 1321   ALBUMIN 4.9 01/08/2024 1321   AST 17 01/08/2024 1321   ALT 11 01/08/2024 1321   ALKPHOS 85 01/08/2024 1321   BILITOT 0.6 01/08/2024 1321   GFRNONAA >60 01/08/2024 1321   Lab Results  Component Value Date   WBC 5.2 01/08/2024   NEUTROABS 3.6 01/08/2024   HGB 14.5 01/08/2024   HCT 43.1 01/08/2024   MCV 87.8 01/08/2024   PLT 204 01/08/2024    Imaging:  [facility] Clinician Interpretation: I have personally reviewed the CNS images as listed.  My interpretation, in the context of the  patient's clinical presentation, is stable  disease  MR BRAIN W WO CONTRAST Result Date: 03/09/2024 EXAM: MRI BRAIN WITH AND WITHOUT CONTRAST 03/09/2024 10:07:02 AM TECHNIQUE: Multiplanar multisequence MRI of the head/brain was performed with and without the administration of intravenous contrast. COMPARISON: MR head without and with contrast 01/04/2024 and 10/30/2023. CLINICAL HISTORY: GBM, IDH wild type. Brain/CNS neoplasm, assess treatment response. FINDINGS: BRAIN AND VENTRICLES: No acute infarct. No acute intracranial hemorrhage. Right occipital craniotomy or resection of tumor significant noted. Residual enhancement along the lateral margin of the resection cavity and anteriorly is stable to slightly increased over the last 2 exams measuring 7.4 x 2.7 x 4.4 cm compared to 7.2 x 2.6 x 4.3 cm. The surrounding vasogenic edema extends into the right temporal lobe, parietal lobe, and posterior right frontal lobe, similar to prior exam. Mass effect is present on the history of horn of the right lateral ventricle. Tumor enhancement extends to the epididymal surface. No midline shift. No hydrocephalus. The sella is unremarkable. Normal flow voids. ORBITS: No acute abnormality. SINUSES: No acute abnormality. BONES AND SOFT TISSUES: Normal bone marrow signal and enhancement. No acute soft tissue abnormality. IMPRESSION: 1. Stable to slightly increased residual enhancement along the lateral margin of the resection cavity and anteriorly, measuring 7.4 x 2.7 x 4.4 cm compared to 7.2 x 2.6 x 4.3 cm, with surrounding vasogenic edema extending into the right temporal, parietal, and posterior frontal lobes, similar to prior exam. Given the stability, the majority of this likely represents post-treatment effect. 2. Mass effect on the atrium of the right lateral ventricle. 3. Tumor enhancement involves the ependymal surface of the posterior horn of the right lateral ventricle . Electronically signed by: Lonni Necessary MD  03/09/2024 10:59 AM EST RP Workstation: [id number]    Assessment/Plan Glioblastoma, IDH-wildtype (HCC)  Julian ELASHA TESS is clinically stable today, now on observation, 4 months removed from having completed cycle #12 of 5-day Temodar .  MRI brain continues to demonstrate esssentially stable findings.  We recommended continuing imaging surveillance only at this time.  She is agreeable with this.  Vimpat  will con't 50mg  BID.  Ok to continue Trazodone  100mg  HS as sleep aid.  We appreciate the opportunity to participate in the care of Donna Barber.    We ask that Tarren W Sadowski return to clinic in 2 months with MRI brain, or sooner as needed.    All questions were answered. The patient knows to call the clinic with any problems, questions or concerns. No barriers to learning were detected.  The total time spent in the encounter was 40 minutes and more than 50% was on counseling and review of test results   Arthea MARLA Manns, MD Medical Director of Neuro-Oncology [facility] at [facility] 03/11/24 10:25 AM

## 2024-03-12 ENCOUNTER — Telehealth: Payer: Self-pay | Admitting: Internal Medicine

## 2024-03-12 NOTE — Telephone Encounter (Signed)
 Scheduled patient for next appointment. Called and spoke with the patient, she is aware.

## 2024-03-13 ENCOUNTER — Other Ambulatory Visit: Payer: Self-pay

## 2024-03-18 ENCOUNTER — Telehealth: Payer: Self-pay | Admitting: *Deleted

## 2024-03-18 MED ORDER — DEXAMETHASONE 4 MG PO TABS: 4.0000 mg | ORAL_TABLET | Freq: Every day | ORAL | 0 refills | Status: DC

## 2024-03-18 NOTE — Telephone Encounter (Signed)
 Communicated Dr Eward response to the patient.  He adivsed to start Decadron  4 mg Once daily.  Phone visit 1 week.

## 2024-03-18 NOTE — Telephone Encounter (Signed)
 Patient called to report changes since last visit on 03/11/2024.  States that 1-2 days after that visit she started noticing increased weakness/listing to left side.  Denies any falls, dizziness.  Reports no changes in mentation.   No steroids currently and no seizures reported.  Reports changes have been subtle.  Also reports starting Wellbutrin 1-2 weeks ago.  Unsure if that is related.  Last MRI 03/09/2024.  Next planned for 05/12/2023.  MD visit 05/14/2023.  Pharmacy [facility].  Routed to Dr Buckley to please advise how to proceed with new changes.

## 2024-03-18 NOTE — Addendum Note (Signed)
 Addended by: Cintya Daughety K on: 03/18/2024 04:16 PM   Modules accepted: Orders

## 2024-03-25 ENCOUNTER — Inpatient Hospital Stay: Attending: Internal Medicine | Admitting: Internal Medicine

## 2024-03-25 DIAGNOSIS — C719 Malignant neoplasm of brain, unspecified: Secondary | ICD-10-CM

## 2024-03-25 MED ORDER — DEXAMETHASONE 1 MG PO TABS: ORAL_TABLET | ORAL | 0 refills | Status: AC

## 2024-03-25 NOTE — Progress Notes (Signed)
 I connected with Donna Barber on 03/25/24 at 11:30 AM EST by telephone visit and verified that I am speaking with the correct person using two identifiers.  I discussed the limitations, risks, security and privacy concerns of performing an evaluation and management service by telemedicine and the availability of in-person appointments. I also discussed with the patient that there may be a patient responsible charge related to this service. The patient expressed understanding and agreed to proceed.   Other persons participating in the visit and their role in the encounter:  n/a   Patient's location:  Home Provider's location:  Office Chief Complaint:  Glioblastoma, IDH-wildtype (HCC)  History of Present Ilness: Donna Barber reports improvement in balance and energy since starting the decadron  4mg  daily last Monday.  No issues tolerating the steroid. No other new or progressive changes, denies seizures or headaches.  Observations: Language and cognition at baseline  Assessment and Plan: Glioblastoma, IDH-wildtype (HCC)  Clinically improved. Recommended decreasing decadron  by 1mg  each week, starting with 3mg  daily tomorrow.  Dose may be modified if focal symptoms recur.  Agreeable with resuming outpatient physical therapy as well.  Follow Up Instructions: We ask that Donna Barber return to clinic in 1 months following next brain MRI, or sooner as needed.  I discussed the assessment and treatment plan with the patient.  The patient was provided an opportunity to ask questions and all were answered.  The patient agreed with the plan and demonstrated understanding of the instructions.    The patient was advised to call back or seek an in-person evaluation if the symptoms worsen or if the condition fails to improve as anticipated.    Kavontae Pritchard K Aprile Dickenson, MD   I provided 20 minutes of non face-to-face telephone visit time during this encounter, and > 50% was spent counseling as documented  under my assessment & plan.

## 2024-04-02 ENCOUNTER — Other Ambulatory Visit: Payer: Self-pay | Admitting: Internal Medicine

## 2024-04-05 ENCOUNTER — Other Ambulatory Visit: Payer: Self-pay

## 2024-04-05 ENCOUNTER — Ambulatory Visit: Admitting: Rehabilitative and Restorative Service Providers"

## 2024-04-05 ENCOUNTER — Encounter: Payer: Self-pay | Admitting: Rehabilitative and Restorative Service Providers"

## 2024-04-05 DIAGNOSIS — R2689 Other abnormalities of gait and mobility: Secondary | ICD-10-CM | POA: Insufficient documentation

## 2024-04-05 DIAGNOSIS — C719 Malignant neoplasm of brain, unspecified: Secondary | ICD-10-CM | POA: Diagnosis not present

## 2024-04-05 DIAGNOSIS — R2681 Unsteadiness on feet: Secondary | ICD-10-CM | POA: Insufficient documentation

## 2024-04-05 DIAGNOSIS — M6281 Muscle weakness (generalized): Secondary | ICD-10-CM | POA: Insufficient documentation

## 2024-04-05 DIAGNOSIS — R29818 Other symptoms and signs involving the nervous system: Secondary | ICD-10-CM | POA: Diagnosis present

## 2024-04-05 NOTE — Therapy (Signed)
 " OUTPATIENT PHYSICAL THERAPY NEURO EVALUATION   Patient Name: Donna Barber MRN: [id number] DOB:December 16, 1950, 73 y.o., female Today's Date: 04/05/2024   PCP: Cheryle Frees, MD REFERRING PROVIDER: Arthea Manns, MD  END OF SESSION:  PT End of Session - 04/05/24 1219     Visit Number 1    Number of Visits 16    Date for Recertification  06/04/24    Authorization Type blue medicare    PT Start Time 0937    PT Stop Time 1024    PT Time Calculation (min) 47 min    Activity Tolerance Patient tolerated treatment well    Behavior During Therapy WFL for tasks assessed/performed          Past Medical History:  Diagnosis Date   Abnormal Pap smear 2000   Cin-3 treated with LEEP   Canker sores oral    Depression    Dyspareunia    secondary to  atrophy   GERD (gastroesophageal reflux disease)    History of chicken pox    History of measles, mumps, or rubella    Postmenopausal    Uterine fibroid    Vaginal atrophy    Yeast infection    Past Surgical History:  Procedure Laterality Date   CARPAL TUNNEL RELEASE     CRANIECTOMY / CRANIOTOMY FOR EXCISION OF BRAIN TUMOR  07/25/2022   DILATION AND CURETTAGE OF UTERUS     shoulder sugery     TUBAL LIGATION     bilateral   Patient Active Problem List   Diagnosis Date Noted   Goals of care, counseling/discussion 01/17/2023   Glioblastoma multiforme of occipital lobe (HCC) 08/20/2022   Glioblastoma, IDH-wildtype (HCC) 08/16/2022   Osteopenia 09/15/2011   Atrophic vaginitis 09/15/2011   H/O dyspareunia 07/08/2011    ONSET DATE: 07/25/22 REFERRING DIAG: C71.9 (ICD-10-CM) - Glioblastoma, IDH-wildtype (HCC)  THERAPY DIAG:  Other abnormalities of gait and mobility  Unsteadiness on feet  Other symptoms and signs involving the nervous system  Muscle weakness (generalized)  Rationale for Evaluation and Treatment: Rehabilitation  SUBJECTIVE:                                                                                                                                                                                      SUBJECTIVE STATEMENT: The patient is s/p brain tumor resection (R thalamus and occipital lobe)in 2024. 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 She furniture walks in the home and holds her husband when out of the house. No falls int he past 6 months. She enjoys walking outside when able. She appears to have L neglect reporting if she holds anything in it, she will drop  it. At home, she does basic cleaning, her husband does laundry and most IADLs. Her husband notes some reduction in confidence with activities.  Pt accompanied by: significant other  PERTINENT HISTORY: glioblastoma, osteopenia, fall Nov 2024 with humerus fx L side  PAIN:  Are you having pain? Yes: NPRS scale: varies, head pain, not HA (temples worst) Pain location: head pains, can get pain if one of her dogs steps on her foot Pain description: varies Aggravating factors: varies Relieving factors: varies  PRECAUTIONS: Fall  WEIGHT BEARING RESTRICTIONS: No  FALLS: Has patient fallen in last 6 months? No  LIVING ENVIRONMENT: Lives with: lives with their spouse Lives in: House/apartment Stairs: Yes: Internal: 12-14 steps; on right going up Has following equipment at home: None  PLOF: Independent  PATIENT GOALS: more confidence with walking, get up and down from a chair better  OBJECTIVE:  Note: Objective measures were completed at Evaluation unless otherwise noted.  DIAGNOSTIC FINDINGS:   IMPRESSION: 1. Stable to slightly increased residual enhancement along the lateral margin of the resection cavity and anteriorly, measuring 7.4 x 2.7 x 4.4 cm compared to 7.2 x 2.6 x 4.3 cm, with surrounding vasogenic edema extending into the right temporal, parietal, and posterior frontal lobes, similar to prior exam. Given the stability, the majority of this likely represents post-treatment effect. 2. Mass effect on the atrium of the right  lateral ventricle. 3. Tumor enhancement involves the ependymal surface of the posterior horn of the right lateral ventricle .  COGNITION: Overall cognitive status: some changes with short term memory    SENSATION: Numbness on L side when I get up in the morning it is a dead leg  COORDINATION: Dec'd motor function in the L side  MUSCLE TONE:  increased L side tone (UE maintains flexed position at elbow and fingers-- can open with assistance-- tight at MCP joint)  POSTURE: falls posteriorly at times with MMT-- PT holds trunk for support  LOWER EXTREMITY ROM:   some mild tightness noted R gastroc -- per gait mechanics  LOWER EXTREMITY MMT:   MMT Right Eval Left Eval  Hip flexion 4/5 3/5  Hip extension    Hip abduction    Hip adduction    Hip internal rotation    Hip external rotation    Knee flexion    Knee extension 4/5 3/5  Ankle dorsiflexion    Ankle plantarflexion    Ankle inversion    Ankle eversion    (Blank rows = not tested)  BED MOBILITY:  Uses U bar with mod independence per reports  TRANSFERS: Sit to stand: CGA  Assistive device utilized: None     Stand to sit: CGA  Assistive device utilized: None      STAIRS: Not tested- TBA GAIT: Findings: Gait Characteristics: step through pattern, decreased step length- Left, decreased stance time- Left, and poor foot clearance- Left, Distance walked: 100 ft, and Comments: Needs min A -- cues for visual scanning, will run into objects on the L side, needs min A   FUNCTIONAL TESTS:  5 times sit to stand: 16.35 with R UE assist and support   [facility] PT Assessment - 04/05/24 0958       Standardized Balance Assessment   Standardized Balance Assessment Berg Balance Test;Five Times Sit to Stand      Berg Balance Test   Sit to Stand Able to stand using hands after several tries    Standing Unsupported Needs several tries to stand 30 seconds unsupported  Sitting with Back Unsupported but Feet Supported on Floor or  Stool Able to sit safely and securely 2 minutes    Stand to Sit Sits safely with minimal use of hands    Transfers Needs one person to assist    Standing Unsupported with Eyes Closed Needs help to keep from falling    Standing Unsupported with Feet Together Needs help to attain position and unable to hold for 15 seconds    From Standing, Reach Forward with Outstretched Arm Loses balance while trying/requires external support    From Standing Position, Pick up Object from Floor Unable to try/needs assist to keep balance    From Standing Position, Turn to Look Behind Over each Shoulder Needs assist to keep from losing balance and falling    Turn 360 Degrees Needs assistance while turning    Standing Unsupported, Alternately Place Feet on Step/Stool Needs assistance to keep from falling or unable to try    Standing Unsupported, One Foot in Front Needs help to step but can hold 15 seconds    Standing on One Leg Unable to try or needs assist to prevent fall    Total Score 13    Berg comment: 13/56                                                                                                                                     [facility] Adult PT Treatment:                                                DATE: 04/05/24 Neuromuscular re-ed: Sit<>stand Standing weight shift near wall Wall bumps in standing  PATIENT EDUCATION: Education details: HEP, goals of therapy,a nd recommended frequency Person educated: Patient and Spouse Education method: Explanation, Demonstration, and Handouts Education comprehension: verbalized understanding, returned demonstration, and needs further education  HOME EXERCISE PROGRAM: Access Code: [email or web address] URL: [email or web address] Date: 04/05/2024 Prepared by: Tawni Ferrier  Program Notes Have help with both exercises for safety.   Exercises - Sit to Stand with Counter Support  - 1 x daily - 7 x weekly - 2 sets - 5 reps - Standing Anterior  Posterior Weight Shift with Chair  - 1 x daily - 7 x weekly - 1 sets - 10 reps  GOALS: Goals reviewed with patient? Yes  SHORT TERM GOALS: Target date: 05/05/24  The patient will be indep with initial HEP Baseline: initiated at eval Goal status: INITIAL  2.  The patient will demo sit>stand without use of UE support. Baseline:  Needed multiple tries initially (improved within session). Goal status: INITIAL  3.  The patient will improve Berg score to > or equal to 18/56. Baseline:  13/56. Goal status: INITIAL  LONG TERM GOALS: Target date: 06/05/24  The patient will be  indep with progression of HEP. Baseline:  initiated at eval Goal status: INITIAL  2.  The patient will improve Berg score to > or equal to 22/56  to demonstrate improved functional abilities. Baseline: 13/56 Goal status: INITIAL  3.  The patient will complete 5 time sit<>stand without UE support in < or equal to 16 seconds. Baseline:  Did with UE support today (at end of session had improved from initial transfers) Goal status: INITIAL  4.  The patient will negotiate steps with one HR and supervision. Baseline:  Did not assess at eval Goal status: INITIAL  5.  The patient will trial single point cane for gait to reduce reliance on caregiver. Baseline:  Uses husband's hand anytime she is in the community. Goal status: INITIAL   ASSESSMENT: CLINICAL IMPRESSION: Patient is a 73 y.o. female who was seen today for physical therapy evaluation and treatment for s/p glioblastoma + L hemiplegia. She presents with impairments in L UE and LE strength, increased L hand and UE muscle tone, impaired balance, gait deficits, L foot drag with gait, decreased transfers, visual deficits, and sensory deficits. She requires supervision in the home and more assist for gait in the community (needs husband's physical assist). PT to address deficits with focus on balance, compensation for visual changes, and muscle strength to improve  safety for functional tasks.    OBJECTIVE IMPAIRMENTS: Abnormal gait, decreased activity tolerance, decreased balance, decreased cognition, decreased coordination, decreased mobility, decreased strength, impaired tone, impaired vision/preception, and postural dysfunction.   ACTIVITY LIMITATIONS: standing, squatting, stairs, transfers, bed mobility, and locomotion level  PARTICIPATION LIMITATIONS: meal prep, cleaning, laundry, driving, shopping, and community activity  PERSONAL FACTORS: 1-2 comorbidities: glioblastoma, h/o CVA with L hemiplegia, one significant fall are also affecting patient's functional outcome.   REHAB POTENTIAL: Good  CLINICAL DECISION MAKING: Evolving/moderate complexity  EVALUATION COMPLEXITY: Moderate  PLAN:  PT FREQUENCY: 1-2x/week  PT DURATION: 8 weeks  PLANNED INTERVENTIONS: 97164- PT Re-evaluation, 97750- Physical Performance Testing, 97110-Therapeutic exercises, 97530- Therapeutic activity, [id number]- Neuromuscular re-education, 97535- Self Care, 02859- Manual therapy, [phone]- Gait training, Patient/Family education, Balance training, Stair training, Visual/preceptual remediation/compensation, and DME instructions  PLAN FOR NEXT SESSION: add to HEP including L LE strength, balance, sit<>supine transfers, sit<>stand, and work on visual scanning.    Izyan Ezzell, PT 04/05/2024, 12:24 PM        "

## 2024-04-08 ENCOUNTER — Ambulatory Visit: Admitting: Rehabilitative and Restorative Service Providers"

## 2024-04-08 ENCOUNTER — Encounter: Payer: Self-pay | Admitting: Rehabilitative and Restorative Service Providers"

## 2024-04-08 DIAGNOSIS — M6281 Muscle weakness (generalized): Secondary | ICD-10-CM

## 2024-04-08 DIAGNOSIS — R29818 Other symptoms and signs involving the nervous system: Secondary | ICD-10-CM

## 2024-04-08 DIAGNOSIS — R2681 Unsteadiness on feet: Secondary | ICD-10-CM

## 2024-04-08 DIAGNOSIS — R2689 Other abnormalities of gait and mobility: Secondary | ICD-10-CM

## 2024-04-08 NOTE — Therapy (Signed)
 " OUTPATIENT PHYSICAL THERAPY NEURO TREATMENT   Patient Name: Donna Barber MRN: [id number] DOB:[birth year], 73 y.o., female Today's Date: 04/08/2024  PCP: Cheryle Frees, MD REFERRING PROVIDER: Arthea Manns, MD  END OF SESSION:  PT End of Session - 04/08/24 1018     Visit Number 2    Number of Visits 16    Date for Recertification  06/04/24    Authorization Type blue medicare    Progress Note Due on Visit 10    PT Start Time 1018    PT Stop Time 1058    PT Time Calculation (min) 40 min    Activity Tolerance Patient tolerated treatment well    Behavior During Therapy WFL for tasks assessed/performed          Past Medical History:  Diagnosis Date   Abnormal Pap smear 2000   Cin-3 treated with LEEP   Canker sores oral    Depression    Dyspareunia    secondary to  atrophy   GERD (gastroesophageal reflux disease)    History of chicken pox    History of measles, mumps, or rubella    Postmenopausal    Uterine fibroid    Vaginal atrophy    Yeast infection    Past Surgical History:  Procedure Laterality Date   CARPAL TUNNEL RELEASE     CRANIECTOMY / CRANIOTOMY FOR EXCISION OF BRAIN TUMOR  07/25/2022   DILATION AND CURETTAGE OF UTERUS     shoulder sugery     TUBAL LIGATION     bilateral   Patient Active Problem List   Diagnosis Date Noted   Goals of care, counseling/discussion 01/17/2023   Glioblastoma multiforme of occipital lobe (HCC) 08/20/2022   Glioblastoma, IDH-wildtype (HCC) 08/16/2022   Osteopenia 09/15/2011   Atrophic vaginitis 09/15/2011   H/O dyspareunia 07/08/2011    ONSET DATE: 07/25/22 REFERRING DIAG: C71.9 (ICD-10-CM) - Glioblastoma, IDH-wildtype (HCC)  THERAPY DIAG:  Other abnormalities of gait and mobility  Unsteadiness on feet  Other symptoms and signs involving the nervous system  Muscle weakness (generalized)  Rationale for Evaluation and Treatment: Rehabilitation  SUBJECTIVE:                                                                                                                                                                                      SUBJECTIVE STATEMENT: The patient did some of the sit<>stand reps for HEP. She did not do wall bump.   EVAL: The patient is s/p brain tumor resection (R thalamus and occipital lobe)in 2024. 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 She furniture walks in the home and holds her husband when out of the house. No falls  int he past 6 months. She enjoys walking outside when able. She appears to have L neglect reporting if she holds anything in it, she will drop it. At home, she does basic cleaning, her husband does laundry and most IADLs. Her husband notes some reduction in confidence with activities.  Pt accompanied by: significant other  PERTINENT HISTORY: glioblastoma, osteopenia, fall Nov 2024 with humerus fx L side  PAIN:  Are you having pain? Yes: NPRS scale: varies, head pain, not HA (temples worst) Pain location: head pains, can get pain if one of her dogs steps on her foot Pain description: varies Aggravating factors: varies Relieving factors: varies  PRECAUTIONS: Fall  WEIGHT BEARING RESTRICTIONS: No  FALLS: Has patient fallen in last 6 months? No  LIVING ENVIRONMENT: Lives with: lives with their spouse Lives in: House/apartment Stairs: Yes: Internal: 12-14 steps; on right going up Has following equipment at home: None  PLOF: Independent  PATIENT GOALS: more confidence with walking, get up and down from a chair better  OBJECTIVE:  Note: Objective measures were completed at Evaluation unless otherwise noted.  DIAGNOSTIC FINDINGS:   IMPRESSION: 1. Stable to slightly increased residual enhancement along the lateral margin of the resection cavity and anteriorly, measuring 7.4 x 2.7 x 4.4 cm compared to 7.2 x 2.6 x 4.3 cm, with surrounding vasogenic edema extending into the right temporal, parietal, and posterior frontal lobes, similar to prior exam. Given the  stability, the majority of this likely represents post-treatment effect. 2. Mass effect on the atrium of the right lateral ventricle. 3. Tumor enhancement involves the ependymal surface of the posterior horn of the right lateral ventricle .  COGNITION: Overall cognitive status: some changes with short term memory    SENSATION: Numbness on L side when I get up in the morning it is a dead leg  COORDINATION: Dec'd motor function in the L side  MUSCLE TONE:  increased L side tone (UE maintains flexed position at elbow and fingers-- can open with assistance-- tight at MCP joint)  POSTURE: falls posteriorly at times with MMT-- PT holds trunk for support  LOWER EXTREMITY ROM:   some mild tightness noted R gastroc -- per gait mechanics  LOWER EXTREMITY MMT:   MMT Right Eval Left Eval  Hip flexion 4/5 3/5  Hip extension    Hip abduction    Hip adduction    Hip internal rotation    Hip external rotation    Knee flexion    Knee extension 4/5 3/5  Ankle dorsiflexion    Ankle plantarflexion    Ankle inversion    Ankle eversion    (Blank rows = not tested)  BED MOBILITY:  Uses U bar with mod independence per reports  TRANSFERS: Sit to stand: CGA  Assistive device utilized: None     Stand to sit: CGA  Assistive device utilized: None      STAIRS: Not tested- TBA GAIT: Findings: Gait Characteristics: step through pattern, decreased step length- Left, decreased stance time- Left, and poor foot clearance- Left, Distance walked: 100 ft, and Comments: Needs min A -- cues for visual scanning, will run into objects on the L side, needs min A   FUNCTIONAL TESTS:  5 times sit to stand: 16.35 with R UE assist and support    [facility] Adult PT Treatment:  DATE: 04/08/24 Neuromuscular re-ed: Side stepping near counter with SBA Marching with R UE support with SBA Alternating foot taps to 4 surface with R UE support Cone negotiation with cues  for visual scanning Alternating foot taps into box of agility ladder with CGA Therapeutic Activity: Standing squats at counter x 5 reps Coordination taping between 8 and 4 steps with R UE support and CGA Sit<>supine with min A Rolling in bed reaching diagonals with Ues Sit<>stand x 5 reps with R UE Standing heel raises at countertops x 10 reps Gait: With verbal cues to scan environment CGA to min A to avoid obstacles on the L side                                                                                                                              [facility] Adult PT Treatment:                                                DATE: 04/05/24 Neuromuscular re-ed: Sit<>stand Standing weight shift near wall Wall bumps in standing  PATIENT EDUCATION: Education details: HEP, goals of therapy,a nd recommended frequency Person educated: Patient and Spouse Education method: Explanation, Demonstration, and Handouts Education comprehension: verbalized understanding, returned demonstration, and needs further education  HOME EXERCISE PROGRAM: Access Code: [email or web address] URL: [email or web address] Date: 04/08/2024+ Prepared by: Tawni Ferrier  Program Notes Have help with both exercises for safety.   Exercises - Sit to Stand with Counter Support  - 1 x daily - 4 x weekly - 2 sets - 5 reps - Standing Anterior Posterior Weight Shift with Chair  - 1 x daily - 4 x weekly - 1 sets - 10 reps - Side Stepping with Counter Support  - 1 x daily - 4 x weekly - 1 sets - 10 reps - Standing Marching  - 1 x daily - 4 x weekly - 1 sets - 10 reps  GOALS: Goals reviewed with patient? Yes  SHORT TERM GOALS: Target date: 05/05/24  The patient will be indep with initial HEP Baseline: initiated at eval Goal status: INITIAL  2.  The patient will demo sit>stand without use of UE support. Baseline:  Needed multiple tries initially (improved within session). Goal status: INITIAL  3.  The  patient will improve Berg score to > or equal to 18/56. Baseline:  13/56. Goal status: INITIAL  LONG TERM GOALS: Target date: 06/05/24  The patient will be indep with progression of HEP. Baseline:  initiated at eval Goal status: INITIAL  2.  The patient will improve Berg score to > or equal to 22/56  to demonstrate improved functional abilities. Baseline: 13/56 Goal status: INITIAL  3.  The patient will complete 5 time sit<>stand without UE support in < or equal to 16 seconds. Baseline:  Did with UE support  today (at end of session had improved from initial transfers) Goal status: INITIAL  4.  The patient will negotiate steps with one HR and supervision. Baseline:  Did not assess at eval Goal status: INITIAL  5.  The patient will trial single point cane for gait to reduce reliance on caregiver. Baseline:  Uses husband's hand anytime she is in the community. Goal status: INITIAL  ASSESSMENT: CLINICAL IMPRESSION: The patient tolerated addition of 2 more HEP and her husband was provided education to help. PT to continue to develop HEP working on visual scanning, LE strength, and functional training. Continue working to [employer].   EVAL: Patient is a 73 y.o. female who was seen today for physical therapy evaluation and treatment for s/p glioblastoma + L hemiplegia. She presents with impairments in L UE and LE strength, increased L hand and UE muscle tone, impaired balance, gait deficits, L foot drag with gait, decreased transfers, visual deficits, and sensory deficits. She requires supervision in the home and more assist for gait in the community (needs husband's physical assist). PT to address deficits with focus on balance, compensation for visual changes, and muscle strength to improve safety for functional tasks.    OBJECTIVE IMPAIRMENTS: Abnormal gait, decreased activity tolerance, decreased balance, decreased cognition, decreased coordination, decreased mobility, decreased strength,  impaired tone, impaired vision/preception, and postural dysfunction.   PLAN:  PT FREQUENCY: 1-2x/week  PT DURATION: 8 weeks  PLANNED INTERVENTIONS: 97164- PT Re-evaluation, 97750- Physical Performance Testing, 97110-Therapeutic exercises, 97530- Therapeutic activity, [id number]- Neuromuscular re-education, 97535- Self Care, 02859- Manual therapy, [phone]- Gait training, Patient/Family education, Balance training, Stair training, Visual/preceptual remediation/compensation, and DME instructions  PLAN FOR NEXT SESSION: add to HEP including L LE strength, balance, sit<>supine transfers, sit<>stand, and work on visual scanning.     Lennis Korb, PT 04/08/2024, 1:39 PM  "

## 2024-04-13 ENCOUNTER — Other Ambulatory Visit: Payer: Self-pay | Admitting: Internal Medicine

## 2024-04-15 ENCOUNTER — Encounter: Payer: Self-pay | Admitting: Internal Medicine

## 2024-04-16 ENCOUNTER — Ambulatory Visit: Admitting: Rehabilitative and Restorative Service Providers"

## 2024-04-16 DIAGNOSIS — R2681 Unsteadiness on feet: Secondary | ICD-10-CM

## 2024-04-16 DIAGNOSIS — R2689 Other abnormalities of gait and mobility: Secondary | ICD-10-CM

## 2024-04-16 DIAGNOSIS — R29818 Other symptoms and signs involving the nervous system: Secondary | ICD-10-CM

## 2024-04-16 DIAGNOSIS — M6281 Muscle weakness (generalized): Secondary | ICD-10-CM

## 2024-04-16 NOTE — Therapy (Signed)
 " OUTPATIENT PHYSICAL THERAPY NEURO TREATMENT   Patient Name: CARO BRUNDIDGE MRN: [id number] DOB:[birth year], 73 y.o., female Today's Date: 04/16/2024  PCP: Cheryle Frees, MD REFERRING PROVIDER: Arthea Manns, MD  END OF SESSION:  PT End of Session - 04/16/24 1148     Visit Number 3    Number of Visits 16    Date for Recertification  06/04/24    Authorization Type [employer]    Progress Note Due on Visit 10    PT Start Time 1148    PT Stop Time 1230    PT Time Calculation (min) 42 min    Activity Tolerance Patient tolerated treatment well    Behavior During Therapy WFL for tasks assessed/performed           Past Medical History:  Diagnosis Date   Abnormal Pap smear 2000   Cin-3 treated with LEEP   Canker sores oral    Depression    Dyspareunia    secondary to  atrophy   GERD (gastroesophageal reflux disease)    History of chicken pox    History of measles, mumps, or rubella    Postmenopausal    Uterine fibroid    Vaginal atrophy    Yeast infection    Past Surgical History:  Procedure Laterality Date   CARPAL TUNNEL RELEASE     CRANIECTOMY / CRANIOTOMY FOR EXCISION OF BRAIN TUMOR  07/25/2022   DILATION AND CURETTAGE OF UTERUS     shoulder sugery     TUBAL LIGATION     bilateral   Patient Active Problem List   Diagnosis Date Noted   Goals of care, counseling/discussion 01/17/2023   Glioblastoma multiforme of occipital lobe (HCC) 08/20/2022   Glioblastoma, IDH-wildtype (HCC) 08/16/2022   Osteopenia 09/15/2011   Atrophic vaginitis 09/15/2011   H/O dyspareunia 07/08/2011    ONSET DATE: 07/25/22 REFERRING DIAG: C71.9 (ICD-10-CM) - Glioblastoma, IDH-wildtype (HCC)  THERAPY DIAG:  Other abnormalities of gait and mobility  Unsteadiness on feet  Other symptoms and signs involving the nervous system  Muscle weakness (generalized)  Rationale for Evaluation and Treatment: Rehabilitation  SUBJECTIVE:                                                                                                                                                                                      SUBJECTIVE STATEMENT: The patient's husband remained in the lobby. She notes she has done some exercise at home.  EVAL: The patient is s/p brain tumor resection (R thalamus and occipital lobe)in 2024. 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 She furniture walks in the home and holds her husband when out of the house. No falls  int he past 6 months. She enjoys walking outside when able. She appears to have L neglect reporting if she holds anything in it, she will drop it. At home, she does basic cleaning, her husband does laundry and most IADLs. Her husband notes some reduction in confidence with activities.  Pt accompanied by: significant other  PERTINENT HISTORY: glioblastoma, osteopenia, fall Nov 2024 with humerus fx L side  PAIN:  Are you having pain? Yes: NPRS scale: varies, head pain, not HA (temples worst) Pain location: head pains, can get pain if one of her dogs steps on her foot Pain description: varies Aggravating factors: varies Relieving factors: varies  PRECAUTIONS: Fall  WEIGHT BEARING RESTRICTIONS: No  FALLS: Has patient fallen in last 6 months? No  LIVING ENVIRONMENT: Lives with: lives with their spouse Lives in: House/apartment Stairs: Yes: Internal: 12-14 steps; on right going up Has following equipment at home: None  PLOF: Independent  PATIENT GOALS: more confidence with walking, get up and down from a chair better  OBJECTIVE:  Note: Objective measures were completed at Evaluation unless otherwise noted.  DIAGNOSTIC FINDINGS:   IMPRESSION: 1. Stable to slightly increased residual enhancement along the lateral margin of the resection cavity and anteriorly, measuring 7.4 x 2.7 x 4.4 cm compared to 7.2 x 2.6 x 4.3 cm, with surrounding vasogenic edema extending into the right temporal, parietal, and posterior frontal lobes, similar to prior  exam. Given the stability, the majority of this likely represents post-treatment effect. 2. Mass effect on the atrium of the right lateral ventricle. 3. Tumor enhancement involves the ependymal surface of the posterior horn of the right lateral ventricle .  COGNITION: Overall cognitive status: some changes with short term memory    SENSATION: Numbness on L side when I get up in the morning it is a dead leg  COORDINATION: Dec'd motor function in the L side  MUSCLE TONE:  increased L side tone (UE maintains flexed position at elbow and fingers-- can open with assistance-- tight at MCP joint)  POSTURE: falls posteriorly at times with MMT-- PT holds trunk for support  LOWER EXTREMITY ROM:   some mild tightness noted R gastroc -- per gait mechanics  LOWER EXTREMITY MMT:   MMT Right Eval Left Eval  Hip flexion 4/5 3/5  Hip extension    Hip abduction    Hip adduction    Hip internal rotation    Hip external rotation    Knee flexion    Knee extension 4/5 3/5  Ankle dorsiflexion    Ankle plantarflexion    Ankle inversion    Ankle eversion    (Blank rows = not tested)  BED MOBILITY:  Uses U bar with mod independence per reports  TRANSFERS: Sit to stand: CGA  Assistive device utilized: None     Stand to sit: CGA  Assistive device utilized: None      STAIRS: Not tested- TBA GAIT: Findings: Gait Characteristics: step through pattern, decreased step length- Left, decreased stance time- Left, and poor foot clearance- Left, Distance walked: 100 ft, and Comments: Needs min A -- cues for visual scanning, will run into objects on the L side, needs min A   FUNCTIONAL TESTS:  5 times sit to stand: 16.35 with R UE assist and support   [facility] Adult PT Treatment:  DATE: 04/16/24 Neuromuscular re-ed: Sidestepping at countertop x 10 feet x 3 reps with CGA Alternating foot taps to 6 step with CGA to min A with one UE support Up/up,  down/down x 5 reps with tactile, verbal and demo cues Physioball rolling anteriorly while seating for trunk and L UE engagement Physioball press down with mini sit>stand for quad engagement Reaching for targets on the floor to R and L (half circle) for visual scanning, weight shifting Weight shifting with reaching R and L (as able with L UE) to cabinets (at countertop) Therapeutic Activity: Sit<>stand x 5 reps with cues Steppig over yoga blocks in sitting Supine bringing L leg on/off mat Rolling supine<>sidelying with tactile and demo cues Gait: Backwards walking with CGA for safety Gait holding ball in L UE/hand to encourage dec'd flexor pattern Gait with cues for scanning looking L and R with CGA for safety    [facility] Adult PT Treatment:                                                DATE: 04/08/24 Neuromuscular re-ed: Side stepping near counter with SBA Marching with R UE support with SBA Alternating foot taps to 4 surface with R UE support Cone negotiation with cues for visual scanning Alternating foot taps into box of agility ladder with CGA Therapeutic Activity: Standing squats at counter x 5 reps Coordination taping between 8 and 4 steps with R UE support and CGA Sit<>supine with min A Rolling in bed reaching diagonals with Ues Sit<>stand x 5 reps with R UE Standing heel raises at countertops x 10 reps Gait: With verbal cues to scan environment CGA to min A to avoid obstacles on the L side                                                                                                                              [facility] Adult PT Treatment:                                                DATE: 04/05/24 Neuromuscular re-ed: Sit<>stand Standing weight shift near wall Wall bumps in standing  PATIENT EDUCATION: Education details: HEP, goals of therapy,a nd recommended frequency Person educated: Patient and Spouse Education method: Explanation, Demonstration, and  Handouts Education comprehension: verbalized understanding, returned demonstration, and needs further education  HOME EXERCISE PROGRAM: Access Code: [email or web address] URL: [email or web address] Date: 04/08/2024+ Prepared by: Tawni Ferrier  Program Notes Have help with both exercises for safety.   Exercises - Sit to Stand with Counter Support  - 1 x daily - 4 x weekly - 2 sets - 5 reps - Standing Anterior Posterior Weight Shift with Chair  -  1 x daily - 4 x weekly - 1 sets - 10 reps - Side Stepping with Counter Support  - 1 x daily - 4 x weekly - 1 sets - 10 reps - Standing Marching  - 1 x daily - 4 x weekly - 1 sets - 10 reps  GOALS: Goals reviewed with patient? Yes  SHORT TERM GOALS: Target date: 05/05/24  The patient will be modified indep with initial HEP (with husband's assist) Baseline: initiated at eval Goal status: INITIAL  2.  The patient will demo sit>stand without use of UE support. Baseline:  Needed multiple tries initially (improved within session). Goal status: MET  3.  The patient will improve Berg score to > or equal to 18/56. Baseline:  13/56. Goal status: INITIAL  LONG TERM GOALS: Target date: 06/05/24  The patient will be indep with progression of HEP. Baseline:  initiated at eval Goal status: INITIAL  2.  The patient will improve Berg score to > or equal to 22/56  to demonstrate improved functional abilities. Baseline: 13/56 Goal status: INITIAL  3.  The patient will complete 5 time sit<>stand without UE support in < or equal to 16 seconds. Baseline:  Did with UE support today (at end of session had improved from initial transfers) Goal status: INITIAL  4.  The patient will negotiate steps with one HR and supervision. Baseline:  Did not assess at eval Goal status: INITIAL  5.  The patient will trial single point cane for gait to reduce reliance on caregiver. Baseline:  Uses husband's hand anytime she is in the community. Goal status:  INITIAL  ASSESSMENT: CLINICAL IMPRESSION: The patient met STG for sit<>stand without UE support. She is tolerating increased strengthening and PT progressing balance and gait activities as well. PT to continue to develop HEP working on visual scanning, LE strength, and functional training. Continue working to [employer].   EVAL: Patient is a 73 y.o. female who was seen today for physical therapy evaluation and treatment for s/p glioblastoma + L hemiplegia. She presents with impairments in L UE and LE strength, increased L hand and UE muscle tone, impaired balance, gait deficits, L foot drag with gait, decreased transfers, visual deficits, and sensory deficits. She requires supervision in the home and more assist for gait in the community (needs husband's physical assist). PT to address deficits with focus on balance, compensation for visual changes, and muscle strength to improve safety for functional tasks.    OBJECTIVE IMPAIRMENTS: Abnormal gait, decreased activity tolerance, decreased balance, decreased cognition, decreased coordination, decreased mobility, decreased strength, impaired tone, impaired vision/preception, and postural dysfunction.   PLAN:  PT FREQUENCY: 1-2x/week  PT DURATION: 8 weeks  PLANNED INTERVENTIONS: 97164- PT Re-evaluation, 97750- Physical Performance Testing, 97110-Therapeutic exercises, 97530- Therapeutic activity, [id number]- Neuromuscular re-education, 97535- Self Care, [id number]- Manual therapy, [phone]- Gait training, Patient/Family education, Balance training, Stair training, Visual/preceptual remediation/compensation, and DME instructions  PLAN FOR NEXT SESSION: add to HEP including L LE strength, balance, sit<>supine transfers, sit<>stand, and work on visual scanning.     Birdell Frasier, PT 04/16/2024, 12:34 PM  "

## 2024-04-20 ENCOUNTER — Other Ambulatory Visit: Payer: Self-pay | Admitting: Internal Medicine

## 2024-04-22 ENCOUNTER — Encounter: Payer: Self-pay | Admitting: Internal Medicine

## 2024-04-22 DIAGNOSIS — C719 Malignant neoplasm of brain, unspecified: Secondary | ICD-10-CM

## 2024-04-23 ENCOUNTER — Ambulatory Visit: Admitting: Rehabilitative and Restorative Service Providers"

## 2024-04-23 NOTE — Therapy (Deleted)
 " OUTPATIENT PHYSICAL THERAPY NEURO TREATMENT   Patient Name: Donna Barber MRN: [id number] DOB:07-19-1950, 74 y.o., female Today's Date: 04/23/2024  PCP: Cheryle Frees, MD REFERRING PROVIDER: Arthea Manns, MD  END OF SESSION:     Past Medical History:  Diagnosis Date   Abnormal Pap smear 2000   Cin-3 treated with LEEP   Canker sores oral    Depression    Dyspareunia    secondary to  atrophy   GERD (gastroesophageal reflux disease)    History of chicken pox    History of measles, mumps, or rubella    Postmenopausal    Uterine fibroid    Vaginal atrophy    Yeast infection    Past Surgical History:  Procedure Laterality Date   CARPAL TUNNEL RELEASE     CRANIECTOMY / CRANIOTOMY FOR EXCISION OF BRAIN TUMOR  07/25/2022   DILATION AND CURETTAGE OF UTERUS     shoulder sugery     TUBAL LIGATION     bilateral   Patient Active Problem List   Diagnosis Date Noted   Goals of care, counseling/discussion 01/17/2023   Glioblastoma multiforme of occipital lobe (HCC) 08/20/2022   Glioblastoma, IDH-wildtype (HCC) 08/16/2022   Osteopenia 09/15/2011   Atrophic vaginitis 09/15/2011   H/O dyspareunia 07/08/2011    ONSET DATE: 07/25/22 REFERRING DIAG: C71.9 (ICD-10-CM) - Glioblastoma, IDH-wildtype (HCC)  THERAPY DIAG:  No diagnosis found.  Rationale for Evaluation and Treatment: Rehabilitation  SUBJECTIVE:                                                                                                                                                                                     SUBJECTIVE STATEMENT: The patient's husband remained in the lobby. She notes she has done some exercise at home.  EVAL: The patient is s/p brain tumor resection (R thalamus and occipital lobe)in 2024. 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 She furniture walks in the home and holds her husband when out of the house. No falls int he past 6 months. She enjoys walking outside when able. She appears to have L  neglect reporting if she holds anything in it, she will drop it. At home, she does basic cleaning, her husband does laundry and most IADLs. Her husband notes some reduction in confidence with activities.  Pt accompanied by: significant other  PERTINENT HISTORY: glioblastoma, osteopenia, fall Nov 2024 with humerus fx L side  PAIN:  Are you having pain? Yes: NPRS scale: varies, head pain, not HA (temples worst) Pain location: head pains, can get pain if one of her dogs steps on her foot Pain description: varies Aggravating factors: varies Relieving factors: varies  PRECAUTIONS: Fall  WEIGHT BEARING RESTRICTIONS: No  FALLS: Has patient fallen in last 6 months? No  LIVING ENVIRONMENT: Lives with: lives with their spouse Lives in: House/apartment Stairs: Yes: Internal: 12-14 steps; on right going up Has following equipment at home: None  PLOF: Independent  PATIENT GOALS: more confidence with walking, get up and down from a chair better  OBJECTIVE:  Note: Objective measures were completed at Evaluation unless otherwise noted.  DIAGNOSTIC FINDINGS:   IMPRESSION: 1. Stable to slightly increased residual enhancement along the lateral margin of the resection cavity and anteriorly, measuring 7.4 x 2.7 x 4.4 cm compared to 7.2 x 2.6 x 4.3 cm, with surrounding vasogenic edema extending into the right temporal, parietal, and posterior frontal lobes, similar to prior exam. Given the stability, the majority of this likely represents post-treatment effect. 2. Mass effect on the atrium of the right lateral ventricle. 3. Tumor enhancement involves the ependymal surface of the posterior horn of the right lateral ventricle .  COGNITION: Overall cognitive status: some changes with short term memory    SENSATION: Numbness on L side when I get up in the morning it is a dead leg  COORDINATION: Dec'd motor function in the L side  MUSCLE TONE:  increased L side tone (UE maintains flexed  position at elbow and fingers-- can open with assistance-- tight at MCP joint)  POSTURE: falls posteriorly at times with MMT-- PT holds trunk for support  LOWER EXTREMITY ROM:   some mild tightness noted R gastroc -- per gait mechanics  LOWER EXTREMITY MMT:   MMT Right Eval Left Eval  Hip flexion 4/5 3/5  Hip extension    Hip abduction    Hip adduction    Hip internal rotation    Hip external rotation    Knee flexion    Knee extension 4/5 3/5  Ankle dorsiflexion    Ankle plantarflexion    Ankle inversion    Ankle eversion    (Blank rows = not tested)  BED MOBILITY:  Uses U bar with mod independence per reports  TRANSFERS: Sit to stand: CGA  Assistive device utilized: None     Stand to sit: CGA  Assistive device utilized: None      STAIRS: Not tested- TBA GAIT: Findings: Gait Characteristics: step through pattern, decreased step length- Left, decreased stance time- Left, and poor foot clearance- Left, Distance walked: 100 ft, and Comments: Needs min A -- cues for visual scanning, will run into objects on the L side, needs min A   FUNCTIONAL TESTS:  5 times sit to stand: 16.35 with R UE assist and support   [facility] Adult PT Treatment:                                                DATE: 04/16/24 Neuromuscular re-ed: Sidestepping at countertop x 10 feet x 3 reps with CGA Alternating foot taps to 6 step with CGA to min A with one UE support Up/up, down/down x 5 reps with tactile, verbal and demo cues Physioball rolling anteriorly while seating for trunk and L UE engagement Physioball press down with mini sit>stand for quad engagement Reaching for targets on the floor to R and L (half circle) for visual scanning, weight shifting Weight shifting with reaching R and L (as able with L UE) to cabinets (at countertop) Therapeutic Activity: Sit<>stand x 5 reps  with cues Steppig over yoga blocks in sitting Supine bringing L leg on/off mat Rolling supine<>sidelying with  tactile and demo cues Gait: Backwards walking with CGA for safety Gait holding ball in L UE/hand to encourage dec'd flexor pattern Gait with cues for scanning looking L and R with CGA for safety    [facility] Adult PT Treatment:                                                DATE: 04/08/24 Neuromuscular re-ed: Side stepping near counter with SBA Marching with R UE support with SBA Alternating foot taps to 4 surface with R UE support Cone negotiation with cues for visual scanning Alternating foot taps into box of agility ladder with CGA Therapeutic Activity: Standing squats at counter x 5 reps Coordination taping between 8 and 4 steps with R UE support and CGA Sit<>supine with min A Rolling in bed reaching diagonals with Ues Sit<>stand x 5 reps with R UE Standing heel raises at countertops x 10 reps Gait: With verbal cues to scan environment CGA to min A to avoid obstacles on the L side                                                                                                                              [facility] Adult PT Treatment:                                                DATE: 04/05/24 Neuromuscular re-ed: Sit<>stand Standing weight shift near wall Wall bumps in standing  PATIENT EDUCATION: Education details: HEP, goals of therapy,a nd recommended frequency Person educated: Patient and Spouse Education method: Explanation, Demonstration, and Handouts Education comprehension: verbalized understanding, returned demonstration, and needs further education  HOME EXERCISE PROGRAM: Access Code: [email or web address] URL: [email or web address] Date: 04/08/2024+ Prepared by: Tawni Ferrier  Program Notes Have help with both exercises for safety.   Exercises - Sit to Stand with Counter Support  - 1 x daily - 4 x weekly - 2 sets - 5 reps - Standing Anterior Posterior Weight Shift with Chair  - 1 x daily - 4 x weekly - 1 sets - 10 reps - Side Stepping with Counter  Support  - 1 x daily - 4 x weekly - 1 sets - 10 reps - Standing Marching  - 1 x daily - 4 x weekly - 1 sets - 10 reps  GOALS: Goals reviewed with patient? Yes  SHORT TERM GOALS: Target date: 05/05/24  The patient will be modified indep with initial HEP (with husband's assist) Baseline: initiated at eval Goal status: INITIAL  2.  The patient will demo sit>stand without use of UE support.  Baseline:  Needed multiple tries initially (improved within session). Goal status: MET  3.  The patient will improve Berg score to > or equal to 18/56. Baseline:  13/56. Goal status: INITIAL  LONG TERM GOALS: Target date: 06/05/24  The patient will be indep with progression of HEP. Baseline:  initiated at eval Goal status: INITIAL  2.  The patient will improve Berg score to > or equal to 22/56  to demonstrate improved functional abilities. Baseline: 13/56 Goal status: INITIAL  3.  The patient will complete 5 time sit<>stand without UE support in < or equal to 16 seconds. Baseline:  Did with UE support today (at end of session had improved from initial transfers) Goal status: INITIAL  4.  The patient will negotiate steps with one HR and supervision. Baseline:  Did not assess at eval Goal status: INITIAL  5.  The patient will trial single point cane for gait to reduce reliance on caregiver. Baseline:  Uses husband's hand anytime she is in the community. Goal status: INITIAL  ASSESSMENT: CLINICAL IMPRESSION: The patient met STG for sit<>stand without UE support. She is tolerating increased strengthening and PT progressing balance and gait activities as well. PT to continue to develop HEP working on visual scanning, LE strength, and functional training. Continue working to [employer].   EVAL: Patient is a 74 y.o. female who was seen today for physical therapy evaluation and treatment for s/p glioblastoma + L hemiplegia. She presents with impairments in L UE and LE strength, increased L hand and  UE muscle tone, impaired balance, gait deficits, L foot drag with gait, decreased transfers, visual deficits, and sensory deficits. She requires supervision in the home and more assist for gait in the community (needs husband's physical assist). PT to address deficits with focus on balance, compensation for visual changes, and muscle strength to improve safety for functional tasks.    OBJECTIVE IMPAIRMENTS: Abnormal gait, decreased activity tolerance, decreased balance, decreased cognition, decreased coordination, decreased mobility, decreased strength, impaired tone, impaired vision/preception, and postural dysfunction.   PLAN:  PT FREQUENCY: 1-2x/week  PT DURATION: 8 weeks  PLANNED INTERVENTIONS: 97164- PT Re-evaluation, 97750- Physical Performance Testing, 97110-Therapeutic exercises, 97530- Therapeutic activity, [id number]- Neuromuscular re-education, 97535- Self Care, [id number]- Manual therapy, [phone]- Gait training, Patient/Family education, Balance training, Stair training, Visual/preceptual remediation/compensation, and DME instructions  PLAN FOR NEXT SESSION: add to HEP including L LE strength, balance, sit<>supine transfers, sit<>stand, and work on visual scanning.     Aiden Rao, PT 04/23/2024, 8:40 AM  "

## 2024-04-26 ENCOUNTER — Ambulatory Visit: Attending: Internal Medicine | Admitting: Rehabilitative and Restorative Service Providers"

## 2024-04-26 DIAGNOSIS — R29818 Other symptoms and signs involving the nervous system: Secondary | ICD-10-CM | POA: Insufficient documentation

## 2024-04-26 DIAGNOSIS — R2681 Unsteadiness on feet: Secondary | ICD-10-CM | POA: Insufficient documentation

## 2024-04-26 DIAGNOSIS — M6281 Muscle weakness (generalized): Secondary | ICD-10-CM | POA: Insufficient documentation

## 2024-04-26 DIAGNOSIS — R2689 Other abnormalities of gait and mobility: Secondary | ICD-10-CM | POA: Insufficient documentation

## 2024-04-26 NOTE — Therapy (Signed)
 " OUTPATIENT PHYSICAL THERAPY NEURO TREATMENT   Patient Name: Donna Barber MRN: [id number] DOB:05-10-1950, 74 y.o., female Today's Date: 04/26/2024  PCP: Cheryle Frees, MD REFERRING PROVIDER: Arthea Manns, MD  END OF SESSION:  PT End of Session - 04/26/24 0940     Visit Number 4    Number of Visits 16    Date for Recertification  06/04/24    Authorization Type [employer]    Progress Note Due on Visit 10    PT Start Time 0935    PT Stop Time 1016    PT Time Calculation (min) 41 min    Activity Tolerance Patient tolerated treatment well    Behavior During Therapy WFL for tasks assessed/performed           Past Medical History:  Diagnosis Date   Abnormal Pap smear 2000   Cin-3 treated with LEEP   Canker sores oral    Depression    Dyspareunia    secondary to  atrophy   GERD (gastroesophageal reflux disease)    History of chicken pox    History of measles, mumps, or rubella    Postmenopausal    Uterine fibroid    Vaginal atrophy    Yeast infection    Past Surgical History:  Procedure Laterality Date   CARPAL TUNNEL RELEASE     CRANIECTOMY / CRANIOTOMY FOR EXCISION OF BRAIN TUMOR  07/25/2022   DILATION AND CURETTAGE OF UTERUS     shoulder sugery     TUBAL LIGATION     bilateral   Patient Active Problem List   Diagnosis Date Noted   Goals of care, counseling/discussion 01/17/2023   Glioblastoma multiforme of occipital lobe (HCC) 08/20/2022   Glioblastoma, IDH-wildtype (HCC) 08/16/2022   Osteopenia 09/15/2011   Atrophic vaginitis 09/15/2011   H/O dyspareunia 07/08/2011    ONSET DATE: 07/25/22 REFERRING DIAG: C71.9 (ICD-10-CM) - Glioblastoma, IDH-wildtype (HCC)  THERAPY DIAG:  Other abnormalities of gait and mobility  Unsteadiness on feet  Other symptoms and signs involving the nervous system  Muscle weakness (generalized)  Rationale for Evaluation and Treatment: Rehabilitation  SUBJECTIVE:                                                                                                                                                                                      SUBJECTIVE STATEMENT: The patient's husband remained in the lobby. She had a fall 2 days ago. I think we walked yesterday.  EVAL: The patient is s/p brain tumor resection (R thalamus and occipital lobe)in 2024. 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 She furniture walks in the home and holds her husband when out of the  house. No falls int he past 6 months. She enjoys walking outside when able. She appears to have L neglect reporting if she holds anything in it, she will drop it. At home, she does basic cleaning, her husband does laundry and most IADLs. Her husband notes some reduction in confidence with activities.  Pt accompanied by: significant other  PERTINENT HISTORY: glioblastoma, osteopenia, fall Nov 2024 with humerus fx L side  PAIN:  Are you having pain? Yes: NPRS scale: varies, head pain, not HA (temples worst) Pain location: head pains, can get pain if one of her dogs steps on her foot Pain description: varies Aggravating factors: varies Relieving factors: varies  PRECAUTIONS: Fall  WEIGHT BEARING RESTRICTIONS: No  FALLS: Has patient fallen in last 6 months? No  LIVING ENVIRONMENT: Lives with: lives with their spouse Lives in: House/apartment Stairs: Yes: Internal: 12-14 steps; on right going up Has following equipment at home: None  PLOF: Independent  PATIENT GOALS: more confidence with walking, get up and down from a chair better  OBJECTIVE:  Note: Objective measures were completed at Evaluation unless otherwise noted.  DIAGNOSTIC FINDINGS:   IMPRESSION: 1. Stable to slightly increased residual enhancement along the lateral margin of the resection cavity and anteriorly, measuring 7.4 x 2.7 x 4.4 cm compared to 7.2 x 2.6 x 4.3 cm, with surrounding vasogenic edema extending into the right temporal, parietal, and posterior frontal lobes, similar to  prior exam. Given the stability, the majority of this likely represents post-treatment effect. 2. Mass effect on the atrium of the right lateral ventricle. 3. Tumor enhancement involves the ependymal surface of the posterior horn of the right lateral ventricle .  COGNITION: Overall cognitive status: some changes with short term memory    SENSATION: Numbness on L side when I get up in the morning it is a dead leg  COORDINATION: Dec'd motor function in the L side  MUSCLE TONE:  increased L side tone (UE maintains flexed position at elbow and fingers-- can open with assistance-- tight at MCP joint)  POSTURE: falls posteriorly at times with MMT-- PT holds trunk for support  LOWER EXTREMITY ROM:   some mild tightness noted R gastroc -- per gait mechanics  LOWER EXTREMITY MMT:   MMT Right Eval Left Eval  Hip flexion 4/5 3/5  Hip extension    Hip abduction    Hip adduction    Hip internal rotation    Hip external rotation    Knee flexion    Knee extension 4/5 3/5  Ankle dorsiflexion    Ankle plantarflexion    Ankle inversion    Ankle eversion    (Blank rows = not tested)  BED MOBILITY:  Uses U bar with mod independence per reports  TRANSFERS: Sit to stand: CGA  Assistive device utilized: None     Stand to sit: CGA  Assistive device utilized: None      STAIRS: Not tested- TBA GAIT: Findings: Gait Characteristics: step through pattern, decreased step length- Left, decreased stance time- Left, and poor foot clearance- Left, Distance walked: 100 ft, and Comments: Needs min A -- cues for visual scanning, will run into objects on the L side, needs min A   FUNCTIONAL TESTS:  5 times sit to stand: 16.35 with R UE assist and support  [facility] Adult PT Treatment:  DATE: 04/26/24 Therapeutic Exercise: HS stretch seated  Gastroc stretch standing with R UE support and CGA to min A Therapeutic Activity: Stepping up and over a yoga block  from sitting position x 10 reps L side Foot taps to cones and yoga block for visually stimulating Floor transfer initiated and moved with R UE weight bearing into bilat tall kneeling>>patient c/o pain so discontinued and did not go all the way to floor. PT provided min to mod A to return to mat edge. Sit<>stand x 5 reps-- c/o L knee poster pain today so discontinued after 5 UE/trunk rolling physioball to engage bilat Ues Gentle weight shift to the L UE Hand /finger fleixon/extension Gait: Gait with SPC and CGA wtihout evidence of L foot catching/drag x 240 feet PT did not provide cues for cane use and let patient choose what was comfortable  Self Care: Discussed home safety-- they are installing a chair lift next week for descending steps We discussed floor transfers and getting to a solid surface to help press down with R UE We discussed home donning of gloves-- mittens may be easier   [facility] Adult PT Treatment:                                                DATE: 04/16/24 Neuromuscular re-ed: Sidestepping at countertop x 10 feet x 3 reps with CGA Alternating foot taps to 6 step with CGA to min A with one UE support Up/up, down/down x 5 reps with tactile, verbal and demo cues Physioball rolling anteriorly while seating for trunk and L UE engagement Physioball press down with mini sit>stand for quad engagement Reaching for targets on the floor to R and L (half circle) for visual scanning, weight shifting Weight shifting with reaching R and L (as able with L UE) to cabinets (at countertop) Therapeutic Activity: Sit<>stand x 5 reps with cues Steppig over yoga blocks in sitting Supine bringing L leg on/off mat Rolling supine<>sidelying with tactile and demo cues Gait: Backwards walking with CGA for safety Gait holding ball in L UE/hand to encourage dec'd flexor pattern Gait with cues for scanning looking L and R with CGA for safety    [facility] Adult PT Treatment:                                                 DATE: 04/08/24 Neuromuscular re-ed: Side stepping near counter with SBA Marching with R UE support with SBA Alternating foot taps to 4 surface with R UE support Cone negotiation with cues for visual scanning Alternating foot taps into box of agility ladder with CGA Therapeutic Activity: Standing squats at counter x 5 reps Coordination taping between 8 and 4 steps with R UE support and CGA Sit<>supine with min A Rolling in bed reaching diagonals with Ues Sit<>stand x 5 reps with R UE Standing heel raises at countertops x 10 reps Gait: With verbal cues to scan environment CGA to min A to avoid obstacles on the L side  [facility] Adult PT Treatment:                                                DATE: 04/05/24 Neuromuscular re-ed: Sit<>stand Standing weight shift near wall Wall bumps in standing  PATIENT EDUCATION: Education details: HEP, goals of therapy,a nd recommended frequency Person educated: Patient and Spouse Education method: Explanation, Demonstration, and Handouts Education comprehension: verbalized understanding, returned demonstration, and needs further education  HOME EXERCISE PROGRAM: Access Code: [email or web address] URL: [email or web address] Date: 04/08/2024+ Prepared by: Tawni Ferrier  Program Notes Have help with both exercises for safety.   Exercises - Sit to Stand with Counter Support  - 1 x daily - 4 x weekly - 2 sets - 5 reps - Standing Anterior Posterior Weight Shift with Chair  - 1 x daily - 4 x weekly - 1 sets - 10 reps - Side Stepping with Counter Support  - 1 x daily - 4 x weekly - 1 sets - 10 reps - Standing Marching  - 1 x daily - 4 x weekly - 1 sets - 10 reps  GOALS: Goals reviewed with patient? Yes  SHORT TERM GOALS: Target date: 05/05/24  The patient will be modified indep with initial  HEP (with husband's assist) Baseline: initiated at eval Goal status: INITIAL  2.  The patient will demo sit>stand without use of UE support. Baseline:  Needed multiple tries initially (improved within session). Goal status: MET  3.  The patient will improve Berg score to > or equal to 18/56. Baseline:  13/56. Goal status: INITIAL  LONG TERM GOALS: Target date: 06/05/24  The patient will be indep with progression of HEP. Baseline:  initiated at eval Goal status: INITIAL  2.  The patient will improve Berg score to > or equal to 22/56  to demonstrate improved functional abilities. Baseline: 13/56 Goal status: INITIAL  3.  The patient will complete 5 time sit<>stand without UE support in < or equal to 16 seconds. Baseline:  Did with UE support today (at end of session had improved from initial transfers) Goal status: INITIAL  4.  The patient will negotiate steps with one HR and supervision. Baseline:  Did not assess at eval Goal status: INITIAL  5.  The patient will trial single point cane for gait to reduce reliance on caregiver. Baseline:  Uses husband's hand anytime she is in the community. Goal status: INITIAL  ASSESSMENT: CLINICAL IMPRESSION: The patient had a fall this week. We discussed and tried SPC in R UE. Her husband feels that what contributed to this week's fall was carrying something in the R hand and this limited her ability to hold the wall as she stepped up. We discussed goals of reducing/minimizing falls and learning to get up from the floor in the event of floor transfers. Continue working to [employer].   EVAL: Patient is a 74 y.o. female who was seen today for physical therapy evaluation and treatment for s/p glioblastoma + L hemiplegia. She presents with impairments in L UE and LE strength, increased L hand and UE muscle tone, impaired balance, gait deficits, L foot drag with gait, decreased transfers, visual deficits, and sensory deficits. She requires  supervision in the home and more assist for gait in the community (needs husband's physical assist). PT to address deficits with focus on balance, compensation for visual changes, and muscle strength  to improve safety for functional tasks.    OBJECTIVE IMPAIRMENTS: Abnormal gait, decreased activity tolerance, decreased balance, decreased cognition, decreased coordination, decreased mobility, decreased strength, impaired tone, impaired vision/preception, and postural dysfunction.   PLAN:  PT FREQUENCY: 1-2x/week  PT DURATION: 8 weeks  PLANNED INTERVENTIONS: [id number]- PT Re-evaluation, 97750- Physical Performance Testing, 97110-Therapeutic exercises, 97530- Therapeutic activity, [id number]- Neuromuscular re-education, 97535- Self Care, [id number]- Manual therapy, [phone]- Gait training, Patient/Family education, Balance training, Stair training, Visual/preceptual remediation/compensation, and DME instructions  PLAN FOR NEXT SESSION: add to HEP including L LE strength, balance, sit<>supine transfers, sit<>stand, and work on visual scanning.     Donalda Job, PT 04/26/2024, 9:40 AM  "

## 2024-04-29 ENCOUNTER — Encounter: Payer: Self-pay | Admitting: *Deleted

## 2024-04-29 ENCOUNTER — Other Ambulatory Visit: Payer: Self-pay | Admitting: *Deleted

## 2024-04-29 DIAGNOSIS — C719 Malignant neoplasm of brain, unspecified: Secondary | ICD-10-CM

## 2024-04-29 NOTE — Progress Notes (Signed)
 Received call from [facility] (spoke with Trenita).  She is with a company that the patients health insurance coordinates with to provide additional in home services to the patients with.  Patient expressed desire to have palliative care In Home.  The insurance company recommended [employer] [phone] and Fax [phone].   Referral sent to [facility].

## 2024-05-01 ENCOUNTER — Telehealth: Payer: Self-pay | Admitting: *Deleted

## 2024-05-01 NOTE — Telephone Encounter (Signed)
 Received message from [employer] stating patient has been approved for Palliative Care and they will call today.

## 2024-05-03 ENCOUNTER — Telehealth: Payer: Self-pay

## 2024-05-03 ENCOUNTER — Ambulatory Visit: Admitting: Rehabilitative and Restorative Service Providers"

## 2024-05-03 DIAGNOSIS — R2689 Other abnormalities of gait and mobility: Secondary | ICD-10-CM | POA: Diagnosis not present

## 2024-05-03 DIAGNOSIS — M6281 Muscle weakness (generalized): Secondary | ICD-10-CM

## 2024-05-03 DIAGNOSIS — R29818 Other symptoms and signs involving the nervous system: Secondary | ICD-10-CM

## 2024-05-03 DIAGNOSIS — R2681 Unsteadiness on feet: Secondary | ICD-10-CM

## 2024-05-03 NOTE — Therapy (Signed)
 " OUTPATIENT PHYSICAL THERAPY NEURO TREATMENT  Patient Name: Donna Barber MRN: [id number] DOB:1950/05/29, 74 y.o., female Today's Date: 05/03/2024  PCP: Cheryle Frees, MD REFERRING PROVIDER: Arthea Manns, MD  END OF SESSION:  PT End of Session - 05/03/24 0855     Visit Number 5    Number of Visits 16    Date for Recertification  06/04/24    Authorization Type blue medicare    Progress Note Due on Visit 10    PT Start Time 0855    PT Stop Time 0933    PT Time Calculation (min) 38 min    Activity Tolerance Patient tolerated treatment well    Behavior During Therapy WFL for tasks assessed/performed           Past Medical History:  Diagnosis Date   Abnormal Pap smear 2000   Cin-3 treated with LEEP   Canker sores oral    Depression    Dyspareunia    secondary to  atrophy   GERD (gastroesophageal reflux disease)    History of chicken pox    History of measles, mumps, or rubella    Postmenopausal    Uterine fibroid    Vaginal atrophy    Yeast infection    Past Surgical History:  Procedure Laterality Date   CARPAL TUNNEL RELEASE     CRANIECTOMY / CRANIOTOMY FOR EXCISION OF BRAIN TUMOR  07/25/2022   DILATION AND CURETTAGE OF UTERUS     shoulder sugery     TUBAL LIGATION     bilateral   Patient Active Problem List   Diagnosis Date Noted   Goals of care, counseling/discussion 01/17/2023   Glioblastoma multiforme of occipital lobe (HCC) 08/20/2022   Glioblastoma, IDH-wildtype (HCC) 08/16/2022   Osteopenia 09/15/2011   Atrophic vaginitis 09/15/2011   H/O dyspareunia 07/08/2011    ONSET DATE: 07/25/22 REFERRING DIAG: C71.9 (ICD-10-CM) - Glioblastoma, IDH-wildtype (HCC)  THERAPY DIAG:  Other abnormalities of gait and mobility  Unsteadiness on feet  Other symptoms and signs involving the nervous system  Muscle weakness (generalized)  Rationale for Evaluation and Treatment: Rehabilitation  SUBJECTIVE:                                                                                                                                                                                      SUBJECTIVE STATEMENT: The patient's husband remained in the lobby. She had another fall this week from a chair when trying to get her jacket on. Her husband helped her (and she reports she helped him as well) get up from the floor. She also tried a cane that a family member/friend let her borrow. She brought gloves  to practice to see how to get the L gloves donned in the cold. She is c/o head pains today in her R temple and above her eye.   EVAL: The patient is s/p brain tumor resection (R thalamus and occipital lobe)in 2024. 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 She furniture walks in the home and holds her husband when out of the house. No falls int he past 6 months. She enjoys walking outside when able. She appears to have L neglect reporting if she holds anything in it, she will drop it. At home, she does basic cleaning, her husband does laundry and most IADLs. Her husband notes some reduction in confidence with activities.  Pt accompanied by: significant other  PERTINENT HISTORY: glioblastoma, osteopenia, fall Nov 2024 with humerus fx L side  PAIN:  Are you having pain? Yes: NPRS scale: varies, head pain, not HA (temples worst) Pain location: head pains, can get pain if one of her dogs steps on her foot Pain description: varies Aggravating factors: varies Relieving factors: varies  PRECAUTIONS: Fall  WEIGHT BEARING RESTRICTIONS: No  FALLS: Has patient fallen in last 6 months? No  LIVING ENVIRONMENT: Lives with: lives with their spouse Lives in: House/apartment Stairs: Yes: Internal: 12-14 steps; on right going up Has following equipment at home: None  PLOF: Independent  PATIENT GOALS: more confidence with walking, get up and down from a chair better  OBJECTIVE:  Note: Objective measures were completed at Evaluation unless otherwise noted.  DIAGNOSTIC  FINDINGS:   IMPRESSION: 1. Stable to slightly increased residual enhancement along the lateral margin of the resection cavity and anteriorly, measuring 7.4 x 2.7 x 4.4 cm compared to 7.2 x 2.6 x 4.3 cm, with surrounding vasogenic edema extending into the right temporal, parietal, and posterior frontal lobes, similar to prior exam. Given the stability, the majority of this likely represents post-treatment effect. 2. Mass effect on the atrium of the right lateral ventricle. 3. Tumor enhancement involves the ependymal surface of the posterior horn of the right lateral ventricle .  COGNITION: Overall cognitive status: some changes with short term memory    SENSATION: Numbness on L side when I get up in the morning it is a dead leg  COORDINATION: Dec'd motor function in the L side  MUSCLE TONE:  increased L side tone (UE maintains flexed position at elbow and fingers-- can open with assistance-- tight at MCP joint)  POSTURE: falls posteriorly at times with MMT-- PT holds trunk for support  LOWER EXTREMITY ROM:   some mild tightness noted R gastroc -- per gait mechanics  LOWER EXTREMITY MMT:   MMT Right Eval Left Eval  Hip flexion 4/5 3/5  Hip extension    Hip abduction    Hip adduction    Hip internal rotation    Hip external rotation    Knee flexion    Knee extension 4/5 3/5  Ankle dorsiflexion    Ankle plantarflexion    Ankle inversion    Ankle eversion    (Blank rows = not tested)  BED MOBILITY:  Uses U bar with mod independence per reports  TRANSFERS: Sit to stand: CGA  Assistive device utilized: None     Stand to sit: CGA  Assistive device utilized: None      STAIRS: Not tested- TBA GAIT: Findings: Gait Characteristics: step through pattern, decreased step length- Left, decreased stance time- Left, and poor foot clearance- Left, Distance walked: 100 ft, and Comments: Needs min A -- cues for visual  scanning, will run into objects on the L side, needs min A    FUNCTIONAL TESTS:  5 times sit to stand: 16.35 with R UE assist and support   [facility] Adult PT Treatment:                                                DATE: 05/03/24  Neuromuscular re-ed: General posturing today: patient is leaning L posteriorly with sitting today Berg=14/56 (was 13/56 at eval)   [facility] PT Assessment - 05/03/24 0907       Standardized Balance Assessment   Standardized Balance Assessment Berg Balance Test      Berg Balance Test   Sit to Stand Able to stand using hands after several tries    Standing Unsupported Able to stand 30 seconds unsupported    Sitting with Back Unsupported but Feet Supported on Floor or Stool Able to sit safely and securely 2 minutes    Stand to Sit Uses backs of legs against chair to control descent    Transfers Needs one person to assist    Standing Unsupported with Eyes Closed Needs help to keep from falling    Standing Unsupported with Feet Together Needs help to attain position and unable to hold for 15 seconds    From Standing, Reach Forward with Outstretched Arm Loses balance while trying/requires external support    From Standing Position, Pick up Object from Floor Unable to pick up and needs supervision    From Standing Position, Turn to Look Behind Over each Shoulder Needs supervision when turning    Turn 360 Degrees Needs assistance while turning    Standing Unsupported, Alternately Place Feet on Step/Stool Needs assistance to keep from falling or unable to try    Standing Unsupported, One Foot in Front Needs help to step but can hold 15 seconds    Standing on One Leg Unable to try or needs assist to prevent fall    Total Score 14    Berg comment: 14/56         Therapeutic Activity: Patient brought her gloves b/c it is a limiting factor -- she cannot walk for exercise with her husband in the cold PT worked on exercises to open hands finger flicking x 5 reps x 3 sets, then finger spreading for mobility x 5 reps x 3 sets Then  we performed donning glove and broke it into steps for her husband---execises to warm up, fingers together to put glove half on, then finger spreading to slide into glove Performed 3 times-- last time with husband learning the steps for carryover to home Gait: Gait with SPC x 160 ft with cues for sequencing, scanning environemnt Patient requires CGA to min A today Self Care: Discussed contining HEP and also discussed encounter for palliative care request--- if skilled visits through home, OP will discontinue  [facility] Adult PT Treatment:                                                DATE: 04/26/24 Therapeutic Exercise: HS stretch seated  Gastroc stretch standing with R UE support and CGA to min A Therapeutic Activity: Stepping up and over a yoga block from sitting position x 10 reps L  side Foot taps to cones and yoga block for visually stimulating Floor transfer initiated and moved with R UE weight bearing into bilat tall kneeling>>patient c/o pain so discontinued and did not go all the way to floor. PT provided min to mod A to return to mat edge. Sit<>stand x 5 reps-- c/o L knee poster pain today so discontinued after 5 UE/trunk rolling physioball to engage bilat Ues Gentle weight shift to the L UE Hand /finger fleixon/extension Gait: Gait with SPC and CGA wtihout evidence of L foot catching/drag x 240 feet PT did not provide cues for cane use and let patient choose what was comfortable  Self Care: Discussed home safety-- they are installing a chair lift next week for descending steps We discussed floor transfers and getting to a solid surface to help press down with R UE We discussed home donning of gloves-- mittens may be easier   [facility] Adult PT Treatment:                                                DATE: 04/16/24 Neuromuscular re-ed: Sidestepping at countertop x 10 feet x 3 reps with CGA Alternating foot taps to 6 step with CGA to min A with one UE support Up/up, down/down x 5  reps with tactile, verbal and demo cues Physioball rolling anteriorly while seating for trunk and L UE engagement Physioball press down with mini sit>stand for quad engagement Reaching for targets on the floor to R and L (half circle) for visual scanning, weight shifting Weight shifting with reaching R and L (as able with L UE) to cabinets (at countertop) Therapeutic Activity: Sit<>stand x 5 reps with cues Steppig over yoga blocks in sitting Supine bringing L leg on/off mat Rolling supine<>sidelying with tactile and demo cues Gait: Backwards walking with CGA for safety Gait holding ball in L UE/hand to encourage dec'd flexor pattern Gait with cues for scanning looking L and R with CGA for safety    [facility] Adult PT Treatment:                                                DATE: 04/08/24 Neuromuscular re-ed: Side stepping near counter with SBA Marching with R UE support with SBA Alternating foot taps to 4 surface with R UE support Cone negotiation with cues for visual scanning Alternating foot taps into box of agility ladder with CGA Therapeutic Activity: Standing squats at counter x 5 reps Coordination taping between 8 and 4 steps with R UE support and CGA Sit<>supine with min A Rolling in bed reaching diagonals with Ues Sit<>stand x 5 reps with R UE Standing heel raises at countertops x 10 reps Gait: With verbal cues to scan environment CGA to min A to avoid obstacles on the L side  [facility] Adult PT Treatment:                                                DATE: 04/05/24 Neuromuscular re-ed: Sit<>stand Standing weight shift near wall Wall bumps in standing  PATIENT EDUCATION: Education details: HEP, goals of therapy,a nd recommended frequency Person educated: Patient and Spouse Education method: Explanation, Demonstration, and Handouts Education  comprehension: verbalized understanding, returned demonstration, and needs further education  HOME EXERCISE PROGRAM: Access Code: [email or web address] URL: [email or web address] Date: 04/08/2024+ Prepared by: Tawni Ferrier  Program Notes Have help with both exercises for safety.   Exercises - Sit to Stand with Counter Support  - 1 x daily - 4 x weekly - 2 sets - 5 reps - Standing Anterior Posterior Weight Shift with Chair  - 1 x daily - 4 x weekly - 1 sets - 10 reps - Side Stepping with Counter Support  - 1 x daily - 4 x weekly - 1 sets - 10 reps - Standing Marching  - 1 x daily - 4 x weekly - 1 sets - 10 reps  GOALS: Goals reviewed with patient? Yes  SHORT TERM GOALS: Target date: 05/05/24  The patient will be modified indep with initial HEP (with husband's assist) Baseline: initiated at eval Goal status: PARTIALLY MET-- has HEP-- variable performance  2.  The patient will demo sit>stand without use of UE support. Baseline:  Needed multiple tries initially (improved within session). Goal status: MET  3.  The patient will improve Berg score to > or equal to 18/56. Baseline:  13/56. Goal status: NOT MET--continues at 14/56--- patient with more trunk leaning and instability today  LONG TERM GOALS: Target date: 06/05/24  The patient will be indep with progression of HEP. Baseline:  initiated at eval Goal status: INITIAL  2.  The patient will improve Berg score to > or equal to 22/56  to demonstrate improved functional abilities. Baseline: 13/56 Goal status: INITIAL  3.  The patient will complete 5 time sit<>stand without UE support in < or equal to 16 seconds. Baseline:  Did with UE support today (at end of session had improved from initial transfers) Goal status: INITIAL  4.  The patient will negotiate steps with one HR and supervision. Baseline:  Did not assess at eval Goal status: INITIAL  5.  The patient will trial single point cane for gait to reduce reliance  on caregiver. Baseline:  Uses husband's hand anytime she is in the community. Goal status: INITIAL  ASSESSMENT: CLINICAL IMPRESSION: The patient had another fall this week. She is c/o greater head pains and has increased L posterior leaning in clinic today. She has MRI scheduled for next week. Patient met one STG, however is unable to do this today due to variability in condition. She did not meet Berg balance STG and partially met HEP goal. Patient's husband to call back to schedule after MRI results learned.  EVAL: Patient is a 74 y.o. female who was seen today for physical therapy evaluation and treatment for s/p glioblastoma + L hemiplegia. She presents with impairments in L UE and LE strength, increased L hand and UE muscle tone, impaired balance, gait deficits, L foot drag with gait, decreased transfers, visual deficits, and sensory deficits. She requires supervision in the home and more assist for gait in the community (needs husband's physical assist). PT  to address deficits with focus on balance, compensation for visual changes, and muscle strength to improve safety for functional tasks.    OBJECTIVE IMPAIRMENTS: Abnormal gait, decreased activity tolerance, decreased balance, decreased cognition, decreased coordination, decreased mobility, decreased strength, impaired tone, impaired vision/preception, and postural dysfunction.   PLAN:  PT FREQUENCY: 1-2x/week  PT DURATION: 8 weeks  PLANNED INTERVENTIONS: 97164- PT Re-evaluation, 97750- Physical Performance Testing, 97110-Therapeutic exercises, 97530- Therapeutic activity, [id number]- Neuromuscular re-education, 97535- Self Care, [id number]- Manual therapy, [phone]- Gait training, Patient/Family education, Balance training, Stair training, Visual/preceptual remediation/compensation, and DME instructions  PLAN FOR NEXT SESSION: add to HEP including L LE strength, balance, sit<>supine transfers, sit<>stand, and work on visual scanning.      Javis Abboud, PT 05/03/2024, 8:57 AM  "

## 2024-05-03 NOTE — Telephone Encounter (Signed)
 Returned call to pt's husband, Bruce.  He reported that the pt has complaint of headache at her temple and back of head; has started leaning to the left again; it is hard for her to lift her (L) leg;  walking has slowed down.  He states Dr Buckley ordered steroids last time this occurred and he wondered if Dr Buckley  thinks it would help again  though he may want to wait until after her MRI scheduled for 05/10/24 .  Mr. Arrazola voiced he is ok with someone from this office reaching back to him Mon, 1/19. Dr Buckley made aware.

## 2024-05-06 MED ORDER — DEXAMETHASONE 2 MG PO TABS: 2.0000 mg | ORAL_TABLET | Freq: Every day | ORAL | 2 refills | Status: AC

## 2024-05-06 NOTE — Telephone Encounter (Signed)
 PC to patient's husband Wolm - informed him Dr Buckley has sent in rx for Dexamethasone  2 mg to be taken once a day to their pharmacy.  He will reevaluate patient's status at her appointment on 05/13/24.  Bruce verbalizes understanding.

## 2024-05-06 NOTE — Addendum Note (Signed)
 Addended by: Eason Housman K on: 05/06/2024 10:49 AM   Modules accepted: Orders

## 2024-05-09 ENCOUNTER — Telehealth: Payer: Self-pay | Admitting: *Deleted

## 2024-05-09 ENCOUNTER — Encounter: Payer: Self-pay | Admitting: Internal Medicine

## 2024-05-09 NOTE — Telephone Encounter (Signed)
 PC to patient, her appointment on Monday rescheduled to Tuesday, 05/14/24 at 10:30 due to possibility of inclement weather.  Patient & her husband aware.

## 2024-05-10 ENCOUNTER — Ambulatory Visit (HOSPITAL_COMMUNITY)
Admission: RE | Admit: 2024-05-10 | Discharge: 2024-05-10 | Disposition: A | Source: Ambulatory Visit | Attending: Internal Medicine

## 2024-05-10 ENCOUNTER — Other Ambulatory Visit: Payer: Self-pay

## 2024-05-10 DIAGNOSIS — C719 Malignant neoplasm of brain, unspecified: Secondary | ICD-10-CM | POA: Insufficient documentation

## 2024-05-10 MED ORDER — GADOBUTROL 1 MMOL/ML IV SOLN
5.0000 mL | Freq: Once | INTRAVENOUS | Status: AC | PRN
  Administered 2024-05-10: 5 mL via INTRAVENOUS

## 2024-05-13 ENCOUNTER — Telehealth: Payer: Self-pay | Admitting: Internal Medicine

## 2024-05-13 ENCOUNTER — Inpatient Hospital Stay: Attending: Internal Medicine | Admitting: Internal Medicine

## 2024-05-13 NOTE — Telephone Encounter (Signed)
 Left a voicemail informing the patient that due to the weather her appointment has been moved back by 30 minutes.

## 2024-05-14 ENCOUNTER — Inpatient Hospital Stay: Attending: Internal Medicine | Admitting: Internal Medicine

## 2024-05-14 ENCOUNTER — Inpatient Hospital Stay: Admitting: Internal Medicine

## 2024-05-14 VITALS — BP 134/65 | HR 74 | Temp 97.5°F | Resp 18 | Wt 123.5 lb

## 2024-05-14 DIAGNOSIS — C713 Malignant neoplasm of parietal lobe: Secondary | ICD-10-CM | POA: Diagnosis not present

## 2024-05-14 DIAGNOSIS — C719 Malignant neoplasm of brain, unspecified: Secondary | ICD-10-CM

## 2024-05-14 NOTE — Progress Notes (Signed)
 "  [facility] at [facility] [address]  [place], [place] [address] [phone]   Interval Evaluation  Date of Service: 05/14/24 Patient Name: ARNESHIA ADE Patient MRN: [id number] Patient DOB: 1950-05-05 Provider: Arthea MARLA Manns, MD  Identifying Statement:  JOHNNETTE LAUX is a 74 y.o. female with right parietal glioblastoma    Oncologic History: Oncology History  Glioblastoma, IDH-wildtype (HCC)  07/25/2022 Surgery   Craniotomy, right parietal resection with Dr. Vennie.  Path demonstrates glioblastoma, IDH wild type.   08/29/2022 - 10/11/2022 Radiation Therapy   Completes 6 weeks IMRT and concurrent Temodar  Audry)   11/22/2022 - 11/06/2023 Chemotherapy   Completes 12 cycle 5-day Temodar       Biomarkers:  MGMT Unknown.  IDH 1/2 Wild type.  EGFR Unknown  TERT Unknown   Interval History: DAURICE OVANDO presents for follow up following recent MRI brain.  Denies any neurologic changes or novel complaints today.  Denies seizures, headaches.  Still has some dizziness and imbalance.  Currently dosing decadron  2mg  daily, she feels it is easier to get up when she's on the steroids.  Walking outside 1-2 times per day with husband.    Prior: Unfortunately she had a provoked fall on 11/25 which resulted in a fracture to the humerus near the shoulder joint on the left side.  She is currently in a sling and will visit with ortho next week for next steps.  Pain is still 7 or 8 at this time, dosing oxycodone  PRN.  Otherwise no new deficits.  Maintains functional independence, no worsening of left sided weakness.   H+P (08/16/22) Patient presents to review recent brain tumor diagnosis.  She presented to medical attention in March 2024 with several days history of new onset severe headache.  CNS imaging demonstrated large enhancing mass in right thalamus and occipital lobe.  She underwent craniotomy, resection with Dr. Vennie on 07/25/22 at [facility]; path demonstrated  glioblastoma IDH wild type.  Following surgery, her left arm and leg were weak, sche completed two weeks of rehab and regained most of her prior function.  Right now has impaired vision on the left, and some weakness of the left arm.  Otherwise functionally independent.  Medications: Current Outpatient Medications on File Prior to Visit  Medication Sig Dispense Refill   acetaminophen  (TYLENOL ) 325 MG tablet Take 650 mg by mouth every 6 (six) hours as needed.     ALPRAZolam (XANAX) 0.25 MG tablet Take 0.25 mg by mouth at bedtime as needed for anxiety.     Baclofen  5 MG TABS Take 1 tablet (5 mg total) by mouth every 6 (six) hours as needed (cramping). 90 tablet 0   buPROPion (WELLBUTRIN SR) 150 MG 12 hr tablet Take 150 mg by mouth daily.     dexamethasone  (DECADRON ) 2 MG tablet Take 1 tablet (2 mg total) by mouth daily. 30 tablet 2   famotidine  (PEPCID ) 20 MG tablet TAKE 1 TABLET BY MOUTH AT BEDTIME 30 tablet 2   lacosamide  (VIMPAT ) 50 MG TABS tablet TAKE 1 TABLET BY MOUTH 2 TIMES A DAY 60 tablet 3   loratadine (CLARITIN) 10 MG tablet Take 10 mg by mouth daily as needed for allergies.     naproxen  (NAPROSYN ) 500 MG tablet Take 1 tablet (500 mg total) by mouth every 6 (six) hours as needed for headache. 30 tablet 0   ondansetron  (ZOFRAN ) 8 MG tablet Take 1 tablet (8 mg total) by mouth every 8 (eight) hours as needed  for nausea or vomiting. 30 tablet 1   pantoprazole  (PROTONIX ) 40 MG tablet TAKE 1 TABLET BY MOUTH 2 TIMES A DAY 30 tablet 2   prochlorperazine  (COMPAZINE ) 10 MG tablet TAKE 1 TABLET BY MOUTH EVERY 6 HOURS AS NEEDED 30 tablet 2   temozolomide  (TEMODAR ) 100 MG capsule Take 1 capsule (100 mg total) by mouth daily. (Take with 140 mg capsules for total daily dose of 240 mg). Take for 5 days on, 23 days off. Repeat every 28 days. May take on an empty stomach to decrease nausea & vomiting. 5 capsule 0   temozolomide  (TEMODAR ) 140 MG capsule Take 1 capsule (140 mg total) by mouth daily. (Take with  100 mg capsules for total daily dose of 240 mg). Take for 5 days on, 23 days off. Repeat every 28 days. May take on an empty stomach to decrease nausea & vomiting. 5 capsule 0   traZODone  (DESYREL ) 100 MG tablet Take 0.5 tablets (50 mg total) by mouth at bedtime. 30 tablet 2   No current facility-administered medications on file prior to visit.    Allergies:  Allergies  Allergen Reactions   Sulfa Antibiotics Nausea And Vomiting   Sulfur Nausea And Vomiting and Nausea Only    Other Reaction(s): GI Intolerance   Codeine Nausea And Vomiting   Elemental Sulfur    Past Medical History:  Past Medical History:  Diagnosis Date   Abnormal Pap smear 2000   Cin-3 treated with LEEP   Canker sores oral    Depression    Dyspareunia    secondary to  atrophy   GERD (gastroesophageal reflux disease)    History of chicken pox    History of measles, mumps, or rubella    Postmenopausal    Uterine fibroid    Vaginal atrophy    Yeast infection    Past Surgical History:  Past Surgical History:  Procedure Laterality Date   CARPAL TUNNEL RELEASE     CRANIECTOMY / CRANIOTOMY FOR EXCISION OF BRAIN TUMOR  07/25/2022   DILATION AND CURETTAGE OF UTERUS     shoulder sugery     TUBAL LIGATION     bilateral   Social History:  Social History   Socioeconomic History   Marital status: Married    Spouse name: Not on file   Number of children: Not on file   Years of education: Not on file   Highest education level: Not on file  Occupational History   Not on file  Tobacco Use   Smoking status: Never   Smokeless tobacco: Never  Vaping Use   Vaping status: Never Used  Substance and Sexual Activity   Alcohol  use: Yes   Drug use: No   Sexual activity: Yes    Birth control/protection: Surgical  Other Topics Concern   Not on file  Social History Narrative   Not on file   Social Drivers of Health   Tobacco Use: Low Risk (04/08/2024)   Patient History    Smoking Tobacco Use: Never     Smokeless Tobacco Use: Never    Passive Exposure: Not on file  Financial Resource Strain: Low Risk (01/04/2022)   Received from [facility]   Overall Financial Resource Strain (CARDIA)    Difficulty of Paying Living Expenses: Not hard at all  Food Insecurity: No Food Insecurity (08/16/2022)   Hunger Vital Sign    Worried About Running Out of Food in the Last Year: Never true    Ran Out of Food in  the Last Year: Never true  Transportation Needs: No Transportation Needs (08/16/2022)   PRAPARE - Administrator, Civil Service (Medical): No    Lack of Transportation (Non-Medical): No  Physical Activity: Sufficiently Active (01/04/2022)   Received from [facility]   Exercise Vital Sign    On average, how many days per week do you engage in moderate to strenuous exercise (like a brisk walk)?: 6 days    On average, how many minutes do you engage in exercise at this level?: 80 min  Stress: No Stress Concern Present (01/04/2022)   Received from [facility] - Occupational Stress Questionnaire    Feeling of Stress : Only a little  Social Connections: Moderately Integrated (01/04/2022)   Received from [facility]   Social Network    How would you rate your social network (family, work, friends)?: Adequate participation with social networks  Intimate Partner Violence: Not At Risk (04/06/2023)   Received from [facility]   HITS    Over the last 12 months how often did your partner physically hurt you?: Never    Over the last 12 months how often did your partner insult you or talk down to you?: Never    Over the last 12 months how often did your partner threaten you with physical harm?: Never    Over the last 12 months how often did your partner scream or curse at you?: Never  Depression (PHQ2-9): Low Risk (03/11/2024)   Depression (PHQ2-9)    PHQ-2 Score: 0  Alcohol  Screen: Not on file  Housing: Low Risk (08/16/2022)   Housing    Last  Housing Risk Score: 0  Utilities: Not At Risk (08/16/2022)   [facility] Utilities    Threatened with loss of utilities: No  Health Literacy: Not on file   Family History:  Family History  Problem Relation Age of Onset   Hypertension Mother    Diabetes Mother    Heart disease Mother    Cancer Brother     Review of Systems: Constitutional: Doesn't report fevers, chills or abnormal weight loss Eyes: Doesn't report blurriness of vision Ears, nose, mouth, throat, and face: Doesn't report sore throat Respiratory: Doesn't report cough, dyspnea or wheezes Cardiovascular: Doesn't report palpitation, chest discomfort  Gastrointestinal:  Doesn't report nausea, constipation, diarrhea GU: Doesn't report incontinence Skin: Doesn't report skin rashes Neurological: Per HPI Musculoskeletal: Doesn't report joint pain Behavioral/Psych: Doesn't report anxiety  Physical Exam: Vitals:   05/14/24 1100  BP: 134/65  Pulse: 74  Resp: 18  Temp: (!) 97.5 F (36.4 C)  SpO2: 97%   KPS: 70. General: Alert, cooperative, pleasant, in no acute distress Head: Normal EENT: No conjunctival injection or scleral icterus.  Lungs: Resp effort normal Cardiac: Regular rate Abdomen: Non-distended abdomen Skin: No rashes cyanosis or petechiae. Extremities: Left arm sling, impaired mobility  Neurologic Exam: Mental Status: Awake, alert, attentive to examiner. Oriented to self and environment. Language is fluent with intact comprehension.  Cranial Nerves: Visual acuity is grossly normal. Left hemianopia noted. Extra-ocular movements intact. No ptosis. Face is symmetric Motor: Tone and bulk are normal. Power is 4/5 in left arm, 4+/5 in left leg. Reflexes are symmetric, no pathologic reflexes present.  Sensory: Intact to light touch Gait: Hemiparetic   Labs: I have reviewed the data as listed    Component Value Date/Time   NA 139 01/08/2024 1321   K 4.8 01/08/2024 1321   CL 103 01/08/2024 1321  CO2 32  01/08/2024 1321   GLUCOSE 101 (H) 01/08/2024 1321   BUN 10 01/08/2024 1321   CREATININE 0.60 01/08/2024 1321   CALCIUM 9.7 01/08/2024 1321   PROT 7.1 01/08/2024 1321   ALBUMIN 4.9 01/08/2024 1321   AST 17 01/08/2024 1321   ALT 11 01/08/2024 1321   ALKPHOS 85 01/08/2024 1321   BILITOT 0.6 01/08/2024 1321   GFRNONAA >60 01/08/2024 1321   Lab Results  Component Value Date   WBC 5.2 01/08/2024   NEUTROABS 3.6 01/08/2024   HGB 14.5 01/08/2024   HCT 43.1 01/08/2024   MCV 87.8 01/08/2024   PLT 204 01/08/2024    Imaging:  [facility] Clinician Interpretation: I have personally reviewed the CNS images as listed.  My interpretation, in the context of the patient's clinical presentation, is stable disease  MR BRAIN W WO CONTRAST Result Date: 05/10/2024 EXAM: MRI BRAIN WITH AND WITHOUT CONTRAST 05/10/2024 11:34:47 AM TECHNIQUE: Multiplanar multisequence MRI of the head/brain was performed with and without the administration of intravenous contrast. CONTRAST: 5 mL Gadavist . COMPARISON: MR head 03/09/2024. CLINICAL HISTORY: Brain/CNS neoplasm, assess treatment response. FINDINGS: BRAIN AND VENTRICLES: No acute infarct. No acute intracranial hemorrhage. No midline shift. No hydrocephalus. The sella is unremarkable. Normal flow voids. Right occipital craniotomy similar appearance of the underlying resection cavity. Irregular peripherally enhancing tissue is present in the right occipital lobe extending into the posterior right temporal lobe and inferior right parietal lobe, measuring 7.4 x 2.9 x 4.5 cm (series 16, image 73; series 17, image 6). This is not significantly changed from the 2 recent prior studies when measuring in a similar manner. Surrounding T2 and FLAIR hyperintensity is noted throughout the right occipital lobe extending into the right temporal lobe and right parietal lobe as well as partially extending into the posterior right frontal lobe and right corona radiata. T2 and FLAIR signal  abnormality is unchanged from prior. Scattered areas of susceptibility throughout the right occipital lobe and posterior right temporal lobe are similar to prior. Similar mass effect is noted on the temporal horn and occipital horn of the right lateral ventricle. No new intracranial lesions. Mild generalized volume loss. ORBITS: No significant abnormality. SINUSES: No significant abnormality. BONES AND SOFT TISSUES: Normal bone marrow signal. No soft tissue abnormality. Right occipital craniotomy similar appearance of the underlying resection cavity. IMPRESSION: 1. Stable irregular peripherally enhancing tissue in the right occipital lobe extending into the posterior right temporal lobe and inferior right parietal lobe. Similar surrounding vasogenic edema. 2. No new intracranial lesions. 3. Similar mass effect on the temporal horn and occipital horn of the right lateral ventricle. Electronically signed by: Donnice Mania MD 05/10/2024 04:07 PM EST RP Workstation: [id number]    Assessment/Plan Glioblastoma, IDH-wildtype (HCC)  Wendie TACHA MANNI is clinically stable today, now on observation, now 6 months removed from having completed cycle #12 of 5-day Temodar .  MRI brain continues to demonstrate stable findings overall, without regrowth of tumor.  We recommended continuing imaging surveillance only at this time.  She is agreeable with this.  Vimpat  will con't 50mg  BID.  Ok to continue Trazodone  100mg  HS as sleep aid.  Discussed and recommended re-engaging with PT and OT, continuing exercises daily at home to improve function.  Decadron  should decrease to 1mg  daily, if tolerated.  We appreciate the opportunity to participate in the care of KEYON WINNICK.    We ask that Jaunita W Plazola return to clinic in 2 months with MRI brain, or sooner as needed.  All questions were answered. The patient knows to call the clinic with any problems, questions or concerns. No barriers to learning were  detected.  The total time spent in the encounter was 40 minutes and more than 50% was on counseling and review of test results   Arthea MARLA Manns, MD Medical Director of Neuro-Oncology [facility] 05/14/24 11:07 AM "

## 2024-05-16 ENCOUNTER — Other Ambulatory Visit: Payer: Self-pay

## 2024-07-15 ENCOUNTER — Inpatient Hospital Stay: Attending: Internal Medicine | Admitting: Internal Medicine
